# Patient Record
Sex: Female | Born: 1945 | Race: White | Hispanic: No | Marital: Married | Smoking: Former smoker
Health system: Southern US, Community
[De-identification: ages and names within clinical notes are randomized; demographics above are authoritative.]

## PROBLEM LIST (undated history)

## (undated) DIAGNOSIS — E785 Hyperlipidemia, unspecified: Secondary | ICD-10-CM

## (undated) DIAGNOSIS — I519 Heart disease, unspecified: Secondary | ICD-10-CM

## (undated) DIAGNOSIS — I10 Essential (primary) hypertension: Secondary | ICD-10-CM

## (undated) DIAGNOSIS — I2510 Atherosclerotic heart disease of native coronary artery without angina pectoris: Secondary | ICD-10-CM

## (undated) DIAGNOSIS — E559 Vitamin D deficiency, unspecified: Secondary | ICD-10-CM

## (undated) DIAGNOSIS — K5792 Diverticulitis of intestine, part unspecified, without perforation or abscess without bleeding: Secondary | ICD-10-CM

## (undated) DIAGNOSIS — R42 Dizziness and giddiness: Secondary | ICD-10-CM

## (undated) DIAGNOSIS — I219 Acute myocardial infarction, unspecified: Secondary | ICD-10-CM

## (undated) DIAGNOSIS — M1990 Unspecified osteoarthritis, unspecified site: Secondary | ICD-10-CM

## (undated) DIAGNOSIS — M797 Fibromyalgia: Secondary | ICD-10-CM

## (undated) DIAGNOSIS — Z972 Presence of dental prosthetic device (complete) (partial): Secondary | ICD-10-CM

## (undated) DIAGNOSIS — R011 Cardiac murmur, unspecified: Secondary | ICD-10-CM

## (undated) HISTORY — PX: VAGINAL HYSTERECTOMY: SUR661

## (undated) HISTORY — PX: EYE SURGERY: SHX253

## (undated) HISTORY — DX: Unspecified osteoarthritis, unspecified site: M19.90

## (undated) HISTORY — DX: Heart disease, unspecified: I51.9

## (undated) HISTORY — DX: Hyperlipidemia, unspecified: E78.5

## (undated) HISTORY — DX: Dizziness and giddiness: R42

## (undated) HISTORY — DX: Presence of dental prosthetic device (complete) (partial): Z97.2

---
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
## 2000-02-15 ENCOUNTER — Encounter: Admission: RE | Admit: 2000-02-15 | Discharge: 2000-02-15 | Payer: Self-pay | Admitting: Family Medicine

## 2000-02-15 ENCOUNTER — Encounter: Payer: Self-pay | Admitting: Family Medicine

## 2001-03-20 ENCOUNTER — Encounter: Payer: Self-pay | Admitting: Family Medicine

## 2001-03-20 ENCOUNTER — Encounter: Admission: RE | Admit: 2001-03-20 | Discharge: 2001-03-20 | Payer: Self-pay | Admitting: Family Medicine

## 2001-03-27 ENCOUNTER — Encounter: Payer: Self-pay | Admitting: Family Medicine

## 2001-03-27 ENCOUNTER — Encounter: Admission: RE | Admit: 2001-03-27 | Discharge: 2001-03-27 | Payer: Self-pay | Admitting: Family Medicine

## 2002-05-09 ENCOUNTER — Encounter: Payer: Self-pay | Admitting: Family Medicine

## 2002-05-09 ENCOUNTER — Encounter: Admission: RE | Admit: 2002-05-09 | Discharge: 2002-05-09 | Payer: Self-pay | Admitting: Family Medicine

## 2005-10-13 ENCOUNTER — Encounter: Admission: RE | Admit: 2005-10-13 | Discharge: 2005-10-13 | Payer: Self-pay | Admitting: Family Medicine

## 2006-07-27 ENCOUNTER — Encounter (HOSPITAL_COMMUNITY): Admission: RE | Admit: 2006-07-27 | Discharge: 2006-09-06 | Payer: Self-pay | Admitting: Cardiology

## 2006-12-09 ENCOUNTER — Encounter: Admission: RE | Admit: 2006-12-09 | Discharge: 2006-12-09 | Payer: Self-pay | Admitting: Rheumatology

## 2006-12-14 ENCOUNTER — Ambulatory Visit: Payer: Self-pay | Admitting: Oncology

## 2006-12-25 DIAGNOSIS — I219 Acute myocardial infarction, unspecified: Secondary | ICD-10-CM

## 2006-12-25 HISTORY — DX: Acute myocardial infarction, unspecified: I21.9

## 2006-12-27 ENCOUNTER — Encounter: Admission: RE | Admit: 2006-12-27 | Discharge: 2006-12-27 | Payer: Self-pay | Admitting: Family Medicine

## 2007-01-03 LAB — MORPHOLOGY - CHCC SATELLITE
Platelet Morphology: NORMAL
RBC Comments: NORMAL

## 2007-01-03 LAB — CBC WITH DIFFERENTIAL (CANCER CENTER ONLY)
BASO%: 0.5 % (ref 0.0–2.0)
EOS%: 2.2 % (ref 0.0–7.0)
HCT: 35.8 % (ref 34.8–46.6)
LYMPH%: 67.2 % — ABNORMAL HIGH (ref 14.0–48.0)
MCHC: 34.7 g/dL (ref 32.0–36.0)
MCV: 94 fL (ref 81–101)
MONO%: 7.9 % (ref 0.0–13.0)
NEUT%: 22.2 % — ABNORMAL LOW (ref 39.6–80.0)
Platelets: 168 10*3/uL (ref 145–400)
RDW: 13.5 % (ref 10.5–14.6)
WBC: 1.3 10*3/uL — ABNORMAL LOW (ref 3.9–10.0)

## 2007-01-04 ENCOUNTER — Ambulatory Visit (HOSPITAL_COMMUNITY): Admission: RE | Admit: 2007-01-04 | Discharge: 2007-01-04 | Payer: Self-pay | Admitting: Oncology

## 2007-01-07 LAB — COMPREHENSIVE METABOLIC PANEL
ALT: 23 U/L (ref 0–35)
Alkaline Phosphatase: 41 U/L (ref 39–117)
Calcium: 9.4 mg/dL (ref 8.4–10.5)
Chloride: 106 mEq/L (ref 96–112)
Creatinine, Ser: 0.82 mg/dL (ref 0.40–1.20)
Glucose, Bld: 105 mg/dL — ABNORMAL HIGH (ref 70–99)
Sodium: 141 mEq/L (ref 135–145)
Total Bilirubin: 0.6 mg/dL (ref 0.3–1.2)
Total Protein: 6.1 g/dL (ref 6.0–8.3)

## 2007-01-07 LAB — LEUKOCYTE ALKALINE PHOS: Leukocyte Alkaline  Phos Stain: 102 (ref 33–149)

## 2007-01-16 ENCOUNTER — Ambulatory Visit (HOSPITAL_COMMUNITY): Admission: RE | Admit: 2007-01-16 | Discharge: 2007-01-16 | Payer: Self-pay | Admitting: Oncology

## 2007-01-16 ENCOUNTER — Encounter (INDEPENDENT_AMBULATORY_CARE_PROVIDER_SITE_OTHER): Payer: Self-pay | Admitting: Interventional Radiology

## 2007-01-31 ENCOUNTER — Ambulatory Visit: Payer: Self-pay | Admitting: Oncology

## 2007-02-01 LAB — MORPHOLOGY - CHCC SATELLITE
PLT EST ~~LOC~~: ADEQUATE
Platelet Morphology: NORMAL

## 2007-02-01 LAB — MANUAL DIFFERENTIAL (CHCC SATELLITE)
Eos: 2 % (ref 0–7)
Metamyelocytes: 1 % — ABNORMAL HIGH (ref 0–0)
Platelet Morphology: NORMAL
SEG: 25 % — ABNORMAL LOW (ref 40–75)
Variant Lymph: 3 % — ABNORMAL HIGH (ref 0–0)

## 2007-02-01 LAB — CBC WITH DIFFERENTIAL (CANCER CENTER ONLY)
HGB: 11.3 g/dL — ABNORMAL LOW (ref 11.6–15.9)
MCH: 33.2 pg (ref 26.0–34.0)
MCHC: 34.8 g/dL (ref 32.0–36.0)
MCV: 95 fL (ref 81–101)
RBC: 3.41 10*6/uL — ABNORMAL LOW (ref 3.70–5.32)

## 2007-03-05 LAB — CBC WITH DIFFERENTIAL (CANCER CENTER ONLY)
BASO%: 0.7 % (ref 0.0–2.0)
EOS%: 4.4 % (ref 0.0–7.0)
LYMPH#: 1.2 10*3/uL (ref 0.9–3.3)
MCHC: 34.7 g/dL (ref 32.0–36.0)
MONO#: 0.2 10*3/uL (ref 0.1–0.9)
NEUT#: 1 10*3/uL — ABNORMAL LOW (ref 1.5–6.5)
NEUT%: 39.5 % — ABNORMAL LOW (ref 39.6–80.0)
Platelets: 183 10*3/uL (ref 145–400)
RDW: 13 % (ref 10.5–14.6)
WBC: 2.4 10*3/uL — ABNORMAL LOW (ref 3.9–10.0)

## 2007-05-27 ENCOUNTER — Ambulatory Visit: Payer: Self-pay | Admitting: Oncology

## 2007-05-28 LAB — COMPREHENSIVE METABOLIC PANEL
BUN: 15 mg/dL (ref 6–23)
CO2: 32 mEq/L (ref 19–32)
Calcium: 9.4 mg/dL (ref 8.4–10.5)
Chloride: 103 mEq/L (ref 96–112)
Creatinine, Ser: 0.75 mg/dL (ref 0.40–1.20)
Glucose, Bld: 95 mg/dL (ref 70–99)
Total Bilirubin: 0.4 mg/dL (ref 0.3–1.2)

## 2007-05-28 LAB — CBC WITH DIFFERENTIAL (CANCER CENTER ONLY)
BASO#: 0 10*3/uL (ref 0.0–0.2)
Eosinophils Absolute: 0.1 10*3/uL (ref 0.0–0.5)
HCT: 37.7 % (ref 34.8–46.6)
HGB: 13.1 g/dL (ref 11.6–15.9)
LYMPH#: 3 10*3/uL (ref 0.9–3.3)
MCH: 31.9 pg (ref 26.0–34.0)
NEUT#: 2.7 10*3/uL (ref 1.5–6.5)
RBC: 4.11 10*6/uL (ref 3.70–5.32)

## 2007-05-28 LAB — LACTATE DEHYDROGENASE: LDH: 139 U/L (ref 94–250)

## 2007-08-19 ENCOUNTER — Ambulatory Visit: Payer: Self-pay | Admitting: Oncology

## 2007-08-22 LAB — CBC WITH DIFFERENTIAL (CANCER CENTER ONLY)
BASO#: 0 10*3/uL (ref 0.0–0.2)
BASO%: 0.7 % (ref 0.0–2.0)
EOS%: 2.4 % (ref 0.0–7.0)
HGB: 13.2 g/dL (ref 11.6–15.9)
LYMPH#: 2.8 10*3/uL (ref 0.9–3.3)
MCHC: 34.6 g/dL (ref 32.0–36.0)
MONO#: 0.3 10*3/uL (ref 0.1–0.9)
NEUT#: 2.5 10*3/uL (ref 1.5–6.5)
WBC: 5.7 10*3/uL (ref 3.9–10.0)

## 2007-11-25 ENCOUNTER — Ambulatory Visit: Payer: Self-pay | Admitting: Oncology

## 2008-01-30 ENCOUNTER — Ambulatory Visit: Payer: Self-pay | Admitting: Oncology

## 2008-02-21 LAB — CBC WITH DIFFERENTIAL (CANCER CENTER ONLY)
BASO#: 0 10*3/uL (ref 0.0–0.2)
EOS%: 3.3 % (ref 0.0–7.0)
Eosinophils Absolute: 0.1 10*3/uL (ref 0.0–0.5)
HCT: 37.1 % (ref 34.8–46.6)
HGB: 12.7 g/dL (ref 11.6–15.9)
LYMPH#: 2.4 10*3/uL (ref 0.9–3.3)
MCH: 32.2 pg (ref 26.0–34.0)
MCHC: 34.3 g/dL (ref 32.0–36.0)
MONO%: 4.1 % (ref 0.0–13.0)
NEUT#: 1.4 10*3/uL — ABNORMAL LOW (ref 1.5–6.5)
NEUT%: 34 % — ABNORMAL LOW (ref 39.6–80.0)
RBC: 3.95 10*6/uL (ref 3.70–5.32)

## 2008-02-21 LAB — COMPREHENSIVE METABOLIC PANEL
BUN: 14 mg/dL (ref 6–23)
CO2: 32 mEq/L (ref 19–32)
Calcium: 9.6 mg/dL (ref 8.4–10.5)
Chloride: 104 mEq/L (ref 96–112)
Creatinine, Ser: 0.84 mg/dL (ref 0.40–1.20)

## 2008-03-06 ENCOUNTER — Encounter: Admission: RE | Admit: 2008-03-06 | Discharge: 2008-03-06 | Payer: Self-pay | Admitting: Family Medicine

## 2008-08-19 ENCOUNTER — Ambulatory Visit: Payer: Self-pay | Admitting: Oncology

## 2008-08-20 LAB — CBC WITH DIFFERENTIAL (CANCER CENTER ONLY)
BASO#: 0 10*3/uL (ref 0.0–0.2)
HCT: 35.5 % (ref 34.8–46.6)
HGB: 12.5 g/dL (ref 11.6–15.9)
LYMPH#: 2.4 10*3/uL (ref 0.9–3.3)
MONO#: 0.3 10*3/uL (ref 0.1–0.9)
NEUT#: 1.8 10*3/uL (ref 1.5–6.5)
NEUT%: 38.5 % — ABNORMAL LOW (ref 39.6–80.0)
RBC: 3.8 10*6/uL (ref 3.70–5.32)
WBC: 4.7 10*3/uL (ref 3.9–10.0)

## 2008-10-26 ENCOUNTER — Ambulatory Visit (HOSPITAL_COMMUNITY): Admission: RE | Admit: 2008-10-26 | Discharge: 2008-10-26 | Payer: Self-pay | Admitting: *Deleted

## 2008-10-30 ENCOUNTER — Ambulatory Visit (HOSPITAL_COMMUNITY): Admission: RE | Admit: 2008-10-30 | Discharge: 2008-10-30 | Payer: Self-pay | Admitting: *Deleted

## 2008-11-17 ENCOUNTER — Encounter: Admission: RE | Admit: 2008-11-17 | Discharge: 2008-11-17 | Payer: Self-pay | Admitting: *Deleted

## 2009-03-18 ENCOUNTER — Encounter: Admission: RE | Admit: 2009-03-18 | Discharge: 2009-06-16 | Payer: Self-pay | Admitting: *Deleted

## 2009-04-05 ENCOUNTER — Ambulatory Visit (HOSPITAL_COMMUNITY): Admission: RE | Admit: 2009-04-05 | Discharge: 2009-04-06 | Payer: Self-pay | Admitting: *Deleted

## 2009-04-05 HISTORY — PX: LAPAROSCOPIC GASTRIC BANDING: SHX1100

## 2009-08-25 HISTORY — PX: CORONARY STENT PLACEMENT: SHX1402

## 2009-09-12 ENCOUNTER — Encounter: Payer: Self-pay | Admitting: Emergency Medicine

## 2009-09-13 ENCOUNTER — Ambulatory Visit (HOSPITAL_COMMUNITY): Admission: AD | Admit: 2009-09-13 | Discharge: 2009-09-13 | Payer: Self-pay | Admitting: Cardiovascular Disease

## 2009-09-13 ENCOUNTER — Observation Stay (HOSPITAL_COMMUNITY): Admission: EM | Admit: 2009-09-13 | Discharge: 2009-09-14 | Payer: Self-pay | Admitting: Cardiovascular Disease

## 2009-09-30 ENCOUNTER — Encounter (HOSPITAL_COMMUNITY): Admission: RE | Admit: 2009-09-30 | Discharge: 2009-12-21 | Payer: Self-pay | Admitting: Cardiology

## 2010-02-04 ENCOUNTER — Encounter: Admission: RE | Admit: 2010-02-04 | Discharge: 2010-02-04 | Payer: Self-pay | Admitting: Family Medicine

## 2010-12-30 ENCOUNTER — Encounter (HOSPITAL_COMMUNITY)
Admission: RE | Admit: 2010-12-30 | Discharge: 2011-01-24 | Payer: Self-pay | Source: Home / Self Care | Attending: Rheumatology | Admitting: Rheumatology

## 2011-01-15 ENCOUNTER — Encounter: Payer: Self-pay | Admitting: Rheumatology

## 2011-01-15 ENCOUNTER — Encounter: Payer: Self-pay | Admitting: Family Medicine

## 2011-01-27 ENCOUNTER — Encounter (HOSPITAL_COMMUNITY): Payer: BC Managed Care – PPO | Attending: Interventional Radiology

## 2011-01-27 DIAGNOSIS — M069 Rheumatoid arthritis, unspecified: Secondary | ICD-10-CM | POA: Insufficient documentation

## 2011-02-24 ENCOUNTER — Encounter (HOSPITAL_COMMUNITY): Payer: BC Managed Care – PPO | Attending: Interventional Radiology

## 2011-02-24 DIAGNOSIS — M069 Rheumatoid arthritis, unspecified: Secondary | ICD-10-CM | POA: Insufficient documentation

## 2011-03-24 ENCOUNTER — Encounter (HOSPITAL_COMMUNITY)
Admission: RE | Admit: 2011-03-24 | Discharge: 2011-03-24 | Payer: BC Managed Care – PPO | Source: Ambulatory Visit | Attending: Rheumatology | Admitting: Rheumatology

## 2011-03-31 LAB — BASIC METABOLIC PANEL
BUN: 11 mg/dL (ref 6–23)
CO2: 27 mEq/L (ref 19–32)
CO2: 30 mEq/L (ref 19–32)
Calcium: 8.8 mg/dL (ref 8.4–10.5)
Chloride: 109 mEq/L (ref 96–112)
GFR calc Af Amer: >60 mL/min (ref >60)
GFR calc Af Amer: >60 mL/min (ref >60)
Glucose, Bld: 107 mg/dL — ABNORMAL HIGH (ref 70–99)
Potassium: 3.3 mEq/L — ABNORMAL LOW (ref 3.5–5.1)

## 2011-03-31 LAB — CBC
HCT: 32.8 % — ABNORMAL LOW (ref 36.0–46.0)
Hemoglobin: 11.2 g/dL — ABNORMAL LOW (ref 12.0–15.0)
MCHC: 34 g/dL (ref 30.0–36.0)
MCV: 102.1 fL — ABNORMAL HIGH (ref 78.0–100.0)
MCV: 103.4 fL — ABNORMAL HIGH (ref 78.0–100.0)
Platelets: 146 10*3/uL — ABNORMAL LOW (ref 150–400)
RBC: 3.18 MIL/uL — ABNORMAL LOW (ref 3.87–5.11)
RDW: 13.4 % (ref 11.5–15.5)
RDW: 14.2 % (ref 11.5–15.5)
WBC: 5.3 10*3/uL (ref 4.0–10.5)

## 2011-03-31 LAB — CARDIAC PANEL(CRET KIN+CKTOT+MB+TROPI)
CK, MB: 1.9 ng/mL (ref 0.3–4.0)
Troponin I: 0.03 ng/mL (ref 0.00–0.06)
Troponin I: 0.04 ng/mL (ref 0.00–0.06)

## 2011-03-31 LAB — DIFFERENTIAL
Basophils Absolute: 0.1 10*3/uL (ref 0.0–0.1)
Basophils Relative: 1 % (ref 0–1)
Eosinophils Absolute: 0.2 10*3/uL (ref 0.0–0.7)
Eosinophils Relative: 4 % (ref 0–5)
Lymphs Abs: 2.1 10*3/uL (ref 0.7–4.0)
Monocytes Absolute: 0.3 10*3/uL (ref 0.1–1.0)
Monocytes Relative: 5 % (ref 3–12)

## 2011-03-31 LAB — URINALYSIS, ROUTINE W REFLEX MICROSCOPIC
Glucose, UA: NEGATIVE mg/dL
Hgb urine dipstick: NEGATIVE
Protein, ur: NEGATIVE mg/dL
Urobilinogen, UA: 0.2 mg/dL (ref 0.0–1.0)
pH: 6 (ref 5.0–8.0)

## 2011-03-31 LAB — CK TOTAL AND CKMB (NOT AT ARMC)
CK, MB: 4.5 ng/mL — ABNORMAL HIGH (ref 0.3–4.0)
Relative Index: INVALID (ref 0.0–2.5)
Total CK: 59 U/L (ref 7–177)
Total CK: 61 U/L (ref 7–177)

## 2011-03-31 LAB — POCT CARDIAC MARKERS
CKMB, poc: 1.4 ng/mL (ref 1.0–8.0)
Troponin i, poc: <0.05 ng/mL (ref 0.00–0.09)

## 2011-03-31 LAB — TSH: TSH: 6.209 u[IU]/mL — ABNORMAL HIGH (ref 0.350–4.500)

## 2011-03-31 LAB — TROPONIN I: Troponin I: 0.03 ng/mL (ref 0.00–0.06)

## 2011-03-31 LAB — LIPID PANEL: Triglycerides: 189 mg/dL — ABNORMAL HIGH (ref <150)

## 2011-04-05 LAB — CBC
HCT: 37.7 % (ref 36.0–46.0)
Hemoglobin: 12.4 g/dL (ref 12.0–15.0)
Hemoglobin: 12.8 g/dL (ref 12.0–15.0)
Hemoglobin: 13.1 g/dL (ref 12.0–15.0)
Platelets: 153 10*3/uL (ref 150–400)
Platelets: 191 10*3/uL (ref 150–400)
RBC: 3.67 MIL/uL — ABNORMAL LOW (ref 3.87–5.11)
RBC: 3.78 MIL/uL — ABNORMAL LOW (ref 3.87–5.11)
RDW: 14 % (ref 11.5–15.5)
WBC: 4.4 10*3/uL (ref 4.0–10.5)

## 2011-04-05 LAB — COMPREHENSIVE METABOLIC PANEL
Albumin: 4.3 g/dL (ref 3.5–5.2)
Alkaline Phosphatase: 34 U/L — ABNORMAL LOW (ref 39–117)
BUN: 27 mg/dL — ABNORMAL HIGH (ref 6–23)
CO2: 31 mEq/L (ref 19–32)
Chloride: 102 mEq/L (ref 96–112)
Glucose, Bld: 117 mg/dL — ABNORMAL HIGH (ref 70–99)
Potassium: 3.6 mEq/L (ref 3.5–5.1)
Total Bilirubin: 0.7 mg/dL (ref 0.3–1.2)

## 2011-04-05 LAB — DIFFERENTIAL
Basophils Absolute: 0 10*3/uL (ref 0.0–0.1)
Basophils Relative: 1 % (ref 0–1)
Lymphocytes Relative: 36 % (ref 12–46)
Lymphs Abs: 2.3 10*3/uL (ref 0.7–4.0)
Monocytes Absolute: 0.3 10*3/uL (ref 0.1–1.0)
Monocytes Absolute: 0.3 10*3/uL (ref 0.1–1.0)
Monocytes Relative: 5 % (ref 3–12)
Neutro Abs: 2.4 10*3/uL (ref 1.7–7.7)
Neutro Abs: 3.7 10*3/uL (ref 1.7–7.7)

## 2011-04-19 ENCOUNTER — Encounter (HOSPITAL_COMMUNITY): Payer: BC Managed Care – PPO | Attending: Interventional Radiology

## 2011-04-19 DIAGNOSIS — M069 Rheumatoid arthritis, unspecified: Secondary | ICD-10-CM | POA: Insufficient documentation

## 2011-05-09 NOTE — Op Note (Signed)
NAMEARDIE, DRAGOO                  ACCOUNT NO.:  [id number]   MEDICAL RECORD NO.:  [id number]          PATIENT TYPE:  OIB   LOCATION:  1539                         FACILITY:  [facility]   PHYSICIAN:  Thornton Park. Daphine Deutscher, MD  DATE OF BIRTH:  05-May-1946   DATE OF PROCEDURE:  04/05/2009  DATE OF DISCHARGE:                               OPERATIVE REPORT   PREOPERATIVE DIAGNOSIS:  Vanessa Collins has rheumatoid arthritis and Felty  syndrome and morbid obesity.   PROCEDURE:  Laparoscopic adjustable gastric banding (Allergan APS).   ASSISTANT:  Karie Soda, MD.   ANESTHESIA:  General endotracheal.   DESCRIPTION OF PROCEDURE:  Ms. Venne is a 65 year old lady taken to room  one on April 05, 2009, and given general anesthesia.  The abdomen was  prepped with a Techni-Care equivalent and draped sterilely.  After  appropriate time-out, the patient's abdomen was accessed via the left  upper quadrant using an OptiView 11/12 port without difficulty and the  abdomen was insufflated.  Standard trocar placements were used, and a 5  mm was placed in the upper abdomen for the [facility] retractor  placement.  This exposed the GE junction.   We then exposed the left side, and I went ahead made a little incision  on the crus and did a preliminary dissection there.  We opened up in for  the pars flaccida approach and identified the fat stripe along the right  crus.  I opened that up and easily passed the band passer around.  The  APS band was introduced into the abdomen, and the tip with a silk on the  tip was threaded through the band passer and then pulled around without  difficulty, engaging the buckle which was over the first hump before  locking.  The calibration tubing was passed initially by the nurse  anesthetist, but I went ahead and got Dr. Jean Rosenthal, and he was able to  pass, we identified and then we snapped the band in place over that.  It  was then plicated anteriorly with three sutures, getting bites  of the EG  junction, small pouch and holding the plication stitches in place with  tie knots.  The band appeared to be in good position, and the tubing was  brought to the lower port, affixed to the port device.  This was then  secured to the fascia with four sutures of 2-0 Prolene.   The wounds were irrigated and closed in layers with 4-0 Vicryl and with  Benzoin and Steri-Strips.  The patient tolerated the procedure well and  was taken to the recovery room in satisfactory condition.  She will be  kept overnight for observation.      Thornton Park Daphine Deutscher, MD  Electronically Signed     MBM/MEDQ  D:  04/05/2009  T:  04/05/2009  Job:  [id number]   cc:   L. Lupe Carney, M.D.  Fax: [phone]   Eduardo Osier. Sharyn Lull, M.D.  Fax: [phone]   Sanjeev K. Corliss Skains, M.D.  Fax: [phone]   Drue Second, MD  Fax: [phone]

## 2011-05-19 ENCOUNTER — Encounter (HOSPITAL_COMMUNITY): Payer: BC Managed Care – PPO | Attending: Interventional Radiology

## 2011-05-19 DIAGNOSIS — M069 Rheumatoid arthritis, unspecified: Secondary | ICD-10-CM | POA: Insufficient documentation

## 2012-01-11 ENCOUNTER — Other Ambulatory Visit: Payer: Self-pay | Admitting: Family Medicine

## 2012-01-11 DIAGNOSIS — Z1231 Encounter for screening mammogram for malignant neoplasm of breast: Secondary | ICD-10-CM

## 2012-02-05 ENCOUNTER — Other Ambulatory Visit: Payer: Self-pay | Admitting: Cardiology

## 2012-02-12 ENCOUNTER — Ambulatory Visit: Payer: BC Managed Care – PPO

## 2012-02-19 ENCOUNTER — Ambulatory Visit: Payer: BC Managed Care – PPO

## 2012-02-21 ENCOUNTER — Ambulatory Visit: Payer: BC Managed Care – PPO

## 2012-02-28 ENCOUNTER — Ambulatory Visit: Payer: BC Managed Care – PPO

## 2012-02-29 ENCOUNTER — Ambulatory Visit: Payer: BC Managed Care – PPO

## 2012-03-07 ENCOUNTER — Encounter (INDEPENDENT_AMBULATORY_CARE_PROVIDER_SITE_OTHER): Payer: Self-pay | Admitting: Surgery

## 2012-03-28 ENCOUNTER — Encounter (INDEPENDENT_AMBULATORY_CARE_PROVIDER_SITE_OTHER): Payer: BC Managed Care – PPO

## 2012-05-02 ENCOUNTER — Encounter (INDEPENDENT_AMBULATORY_CARE_PROVIDER_SITE_OTHER): Payer: BC Managed Care – PPO

## 2012-05-28 ENCOUNTER — Encounter (INDEPENDENT_AMBULATORY_CARE_PROVIDER_SITE_OTHER): Payer: Self-pay | Admitting: Surgery

## 2012-05-28 DIAGNOSIS — Z972 Presence of dental prosthetic device (complete) (partial): Secondary | ICD-10-CM | POA: Insufficient documentation

## 2012-05-30 ENCOUNTER — Encounter (INDEPENDENT_AMBULATORY_CARE_PROVIDER_SITE_OTHER): Payer: BC Managed Care – PPO

## 2012-06-03 ENCOUNTER — Other Ambulatory Visit: Payer: Self-pay | Admitting: Cardiology

## 2012-06-05 ENCOUNTER — Encounter (INDEPENDENT_AMBULATORY_CARE_PROVIDER_SITE_OTHER): Payer: Self-pay | Admitting: Physician Assistant

## 2012-08-01 ENCOUNTER — Ambulatory Visit (INDEPENDENT_AMBULATORY_CARE_PROVIDER_SITE_OTHER): Payer: BC Managed Care – PPO | Admitting: Physician Assistant

## 2012-08-01 ENCOUNTER — Encounter (INDEPENDENT_AMBULATORY_CARE_PROVIDER_SITE_OTHER): Payer: Self-pay

## 2012-08-01 VITALS — BP 132/70 | HR 64 | Temp 97.1°F | Resp 16 | Ht 65.0 in | Wt 175.8 lb

## 2012-08-01 DIAGNOSIS — Z4651 Encounter for fitting and adjustment of gastric lap band: Secondary | ICD-10-CM

## 2012-08-01 NOTE — Patient Instructions (Signed)
Take clear liquids tonight. Thin protein shakes are ok to start tomorrow morning. Slowly advance your diet thereafter. Call us if you have persistent vomiting or regurgitation, night cough or reflux symptoms. Return as scheduled or sooner if you notice no changes in hunger/portion sizes.  

## 2012-08-01 NOTE — Progress Notes (Signed)
  HISTORY: Vanessa Collins is a 66 y.o.female who received an AP-Standard lap-band in April 2010 by Dr. Daphine Deutscher. She comes in having been last seen 15 months ago and has lost 2 lbs. She says she had lost 10 lbs but began gaining weight again due to increasing hunger and portion sizes. She denies persistent reflux or regurgitation.  VITAL SIGNS: Filed Vitals:   08/01/12 1014  BP: 132/70  Pulse: 64  Temp: 97.1 F (36.2 C)  Resp: 16    PHYSICAL EXAM: Physical exam reveals a very well-appearing 66 y.o.female in no apparent distress Neurologic: Awake, alert, oriented Psych: Bright affect, conversant Respiratory: Breathing even and unlabored. No stridor or wheezing Abdomen: Soft, nontender, nondistended to palpation. Incisions well-healed. No incisional hernias. Port easily palpated. Extremities: Atraumatic, good range of motion.  ASSESMENT: 66 y.o.  female  s/p AP-Standard lap-band.   PLAN: The patient's port was accessed with a 20G Huber needle without difficulty. Clear fluid was aspirated and 0.5 mL saline was added to the port. The patient was able to swallow water without difficulty following the procedure and was instructed to take clear liquids for the next 24-48 hours and advance slowly as tolerated.

## 2012-10-30 ENCOUNTER — Ambulatory Visit: Payer: BC Managed Care – PPO

## 2012-11-07 ENCOUNTER — Encounter (INDEPENDENT_AMBULATORY_CARE_PROVIDER_SITE_OTHER): Payer: BC Managed Care – PPO

## 2012-11-28 ENCOUNTER — Encounter (INDEPENDENT_AMBULATORY_CARE_PROVIDER_SITE_OTHER): Payer: Self-pay

## 2012-12-12 ENCOUNTER — Ambulatory Visit: Payer: BC Managed Care – PPO

## 2013-01-09 ENCOUNTER — Encounter (INDEPENDENT_AMBULATORY_CARE_PROVIDER_SITE_OTHER): Payer: Self-pay

## 2013-02-13 ENCOUNTER — Ambulatory Visit (INDEPENDENT_AMBULATORY_CARE_PROVIDER_SITE_OTHER): Payer: BC Managed Care – PPO | Admitting: Physician Assistant

## 2013-02-13 ENCOUNTER — Encounter (INDEPENDENT_AMBULATORY_CARE_PROVIDER_SITE_OTHER): Payer: Self-pay

## 2013-02-13 VITALS — BP 134/82 | HR 68 | Temp 97.6°F | Ht 64.0 in | Wt 167.0 lb

## 2013-02-13 DIAGNOSIS — Z4651 Encounter for fitting and adjustment of gastric lap band: Secondary | ICD-10-CM

## 2013-02-13 NOTE — Progress Notes (Signed)
  HISTORY: Vanessa Collins is a 67 y.o.female who received an AP-Standard lap-band in April 2010 by Dr. Daphine Deutscher. She comes in with increasing hunger and larger than desired portion sizes. She denies regurgitation or reflux symptoms. She would like a fill today to get her eating under better control. She is targeting a weight goal of 135 lbs to help her RA.  VITAL SIGNS: Filed Vitals:   02/13/13 1256  BP: 134/82  Pulse: 68  Temp: 97.6 F (36.4 C)    PHYSICAL EXAM: Physical exam reveals a very well-appearing 67 y.o.female in no apparent distress Neurologic: Awake, alert, oriented Psych: Bright affect, conversant Respiratory: Breathing even and unlabored. No stridor or wheezing Abdomen: Soft, nontender, nondistended to palpation. Incisions well-healed. No incisional hernias. Port easily palpated. Extremities: Atraumatic, good range of motion.  ASSESMENT: 67 y.o.  female  s/p AP-Standard lap-band.   PLAN: The patient's port was accessed with a 20G Huber needle without difficulty. Clear fluid was aspirated and 0.25 mL saline was added to the port. The patient was able to swallow water without difficulty following the procedure and was instructed to take clear liquids for the next 24-48 hours and advance slowly as tolerated.

## 2013-02-13 NOTE — Patient Instructions (Signed)
Take clear liquids tonight. Thin protein shakes are ok to start tomorrow morning. Slowly advance your diet thereafter. Call us if you have persistent vomiting or regurgitation, night cough or reflux symptoms. Return as scheduled or sooner if you notice no changes in hunger/portion sizes.  

## 2013-02-25 ENCOUNTER — Ambulatory Visit (INDEPENDENT_AMBULATORY_CARE_PROVIDER_SITE_OTHER): Payer: BC Managed Care – PPO | Admitting: Surgery

## 2013-02-25 ENCOUNTER — Encounter (INDEPENDENT_AMBULATORY_CARE_PROVIDER_SITE_OTHER): Payer: Self-pay | Admitting: Surgery

## 2013-02-25 VITALS — BP 122/70 | HR 72 | Temp 97.1°F | Resp 16 | Ht 64.0 in | Wt 159.2 lb

## 2013-02-25 DIAGNOSIS — Z9884 Bariatric surgery status: Secondary | ICD-10-CM

## 2013-02-25 NOTE — Progress Notes (Signed)
Lapband Fill Encounter Problem List:   Patient Active Problem List  Diagnosis  . Wears dentures    [name] Body mass index is 27.31 kg/(m^2). Weight loss since surgery  47  Having regurgitation?:  yes  Feel that they need a fill?  unfill  Nocturnal reflux?  no  Amount of fill  -0.15   Port site: Accessed easily  Instructions given and weight loss goals discussed.  Since her phenol on February 20 she is noticing she is getting tighter and had difficulty swallowing her coffee in the morning and has been notably eat. I went ahead and accessed her port easily and withdrew 0.15 cc of fluid and a little bit of air on top. I used a tuberculin syringe and let the pressure pushed back. She'll followup in and in 4 months  Matt B. Daphine Deutscher, MD, FACS

## 2013-02-25 NOTE — Patient Instructions (Addendum)

## 2013-06-19 ENCOUNTER — Encounter (INDEPENDENT_AMBULATORY_CARE_PROVIDER_SITE_OTHER): Payer: BC Managed Care – PPO

## 2013-09-26 ENCOUNTER — Other Ambulatory Visit: Payer: Self-pay

## 2013-09-26 DIAGNOSIS — Z1231 Encounter for screening mammogram for malignant neoplasm of breast: Secondary | ICD-10-CM

## 2013-10-27 ENCOUNTER — Ambulatory Visit
Admission: RE | Admit: 2013-10-27 | Discharge: 2013-10-27 | Disposition: A | Discharge status: Home / Self Care | Payer: BC Managed Care – PPO | Source: Ambulatory Visit

## 2013-10-27 DIAGNOSIS — Z1231 Encounter for screening mammogram for malignant neoplasm of breast: Secondary | ICD-10-CM

## 2014-02-26 ENCOUNTER — Encounter (INDEPENDENT_AMBULATORY_CARE_PROVIDER_SITE_OTHER): Payer: BC Managed Care – PPO

## 2014-03-26 ENCOUNTER — Encounter (INDEPENDENT_AMBULATORY_CARE_PROVIDER_SITE_OTHER): Payer: BC Managed Care – PPO

## 2014-04-02 ENCOUNTER — Encounter (INDEPENDENT_AMBULATORY_CARE_PROVIDER_SITE_OTHER): Payer: BC Managed Care – PPO

## 2014-10-16 ENCOUNTER — Other Ambulatory Visit: Payer: Self-pay

## 2014-10-16 DIAGNOSIS — Z1231 Encounter for screening mammogram for malignant neoplasm of breast: Secondary | ICD-10-CM

## 2014-11-25 ENCOUNTER — Ambulatory Visit: Payer: BC Managed Care – PPO

## 2014-12-23 ENCOUNTER — Ambulatory Visit: Payer: BC Managed Care – PPO

## 2015-01-06 ENCOUNTER — Encounter (INDEPENDENT_AMBULATORY_CARE_PROVIDER_SITE_OTHER): Payer: Self-pay

## 2015-01-06 ENCOUNTER — Ambulatory Visit
Admission: RE | Admit: 2015-01-06 | Discharge: 2015-01-06 | Disposition: A | Discharge status: Home / Self Care | Payer: BLUE CROSS/BLUE SHIELD | Source: Ambulatory Visit

## 2015-01-06 DIAGNOSIS — Z1231 Encounter for screening mammogram for malignant neoplasm of breast: Secondary | ICD-10-CM

## 2015-03-02 ENCOUNTER — Other Ambulatory Visit: Payer: Self-pay | Admitting: Family Medicine

## 2015-03-02 ENCOUNTER — Ambulatory Visit
Admission: RE | Admit: 2015-03-02 | Discharge: 2015-03-02 | Disposition: A | Discharge status: Home / Self Care | Payer: BLUE CROSS/BLUE SHIELD | Source: Ambulatory Visit | Attending: Family Medicine | Admitting: Family Medicine

## 2015-03-02 DIAGNOSIS — R05 Cough: Secondary | ICD-10-CM

## 2015-03-02 DIAGNOSIS — R042 Hemoptysis: Secondary | ICD-10-CM

## 2015-03-02 DIAGNOSIS — R053 Chronic cough: Secondary | ICD-10-CM

## 2016-09-29 ENCOUNTER — Encounter (HOSPITAL_COMMUNITY): Payer: Self-pay

## 2016-11-07 ENCOUNTER — Telehealth: Payer: Self-pay | Admitting: Rheumatology

## 2016-11-07 MED ORDER — TOFACITINIB CITRATE 5 MG PO TABS: ORAL_TABLET | ORAL | 2 refills | Status: DC

## 2016-11-07 NOTE — Telephone Encounter (Signed)
I have been able to pull up labs on [facility] from 09/27/16 called patient to advise they are normal Ok to refill per Dr Corliss Skains

## 2016-11-07 NOTE — Telephone Encounter (Signed)
Patient states she had labs done in October and Harriette Ohara is getting denied stating she needs labwork done. Please advise.

## 2016-11-23 ENCOUNTER — Telehealth: Payer: Self-pay | Admitting: Rheumatology

## 2016-11-23 NOTE — Telephone Encounter (Signed)
Called patient on 11/14/16 and 11/23/16. No answer and voicemail was full both times.

## 2016-11-23 NOTE — Telephone Encounter (Signed)
-----   Message from Caffie Damme, RT sent at 11/13/2016  4:18 PM EST ----- Patient needs follow up appt, 6 months from previous

## 2016-12-04 ENCOUNTER — Other Ambulatory Visit: Payer: Self-pay | Admitting: Rheumatology

## 2016-12-04 ENCOUNTER — Telehealth: Payer: Self-pay | Admitting: Rheumatology

## 2016-12-04 NOTE — Telephone Encounter (Signed)
Last Visit: 07/27/16 Next Visit due Jan. 2018. Message sent to front to schedule.  Labs: 10/02/16 WNL  Okay to refill Prednisone?

## 2016-12-04 NOTE — Telephone Encounter (Signed)
-----   Message from Henriette Combs, LPN sent at 57/12/7791  9:54 AM EST ----- Regarding: Please schedule patient for a follow up appointment Please schedule patient for a follow up appointment. Patient due Jan. 2018. Thanks!!

## 2016-12-04 NOTE — Telephone Encounter (Signed)
Attempted to call patient, no answer and patient's mailbox is full.

## 2016-12-04 NOTE — Telephone Encounter (Signed)
Note: Is pt off of xeljanz? Is she hurting more as a result and needs to be on xeljanz. On august note, she wanted to be off of xeljanz.  Keep jan 2018 appt.

## 2016-12-05 NOTE — Telephone Encounter (Signed)
Attempted to contact patient and unable to leave message, mailbox is full.  

## 2016-12-12 NOTE — Telephone Encounter (Signed)
Attempted to contact patient and unable to leave message-mailbox is full.  

## 2016-12-23 ENCOUNTER — Other Ambulatory Visit: Payer: Self-pay | Admitting: Rheumatology

## 2017-01-08 ENCOUNTER — Other Ambulatory Visit: Payer: Self-pay | Admitting: Rheumatology

## 2017-01-08 NOTE — Telephone Encounter (Signed)
Last Visit: 07/27/16 Next Visit due January 2018 and message sent to front to schedule patient.  Labs: 10/02/16 WM: TB Gold: 01/2016 Neg Attempted to contact the patient to advise she is due for labs and unable to leave a message mailbox is full.

## 2017-01-12 ENCOUNTER — Telehealth: Payer: Self-pay | Admitting: Pharmacist

## 2017-01-12 NOTE — Telephone Encounter (Signed)
Received a prior authorization request for patient's Vanessa Collins.  Submitted PA through Cover My Med[employer] and PA was approved, [id number], Coverage Start Date:12/13/2016, Coverage End Date:01/12/2018.    Noted that Vanessa Collins refill is pending as patient is due for lab work.  Called patient to inform her of PA and discuss labs.  Patient did not answer and voicemail was full.

## 2017-01-15 NOTE — Telephone Encounter (Signed)
Second attempt to reach patient to advise her that Harriette Ohara PA was approved and to inform her she is due for labs.  Patient did not answer and voicemail is still full.

## 2017-01-16 NOTE — Telephone Encounter (Signed)
Third attempt to reach patient to advise her that Harriette Ohara PA was approved and to inform her she is due for labs.  Patient did not answer and voicemail is still full.  Noted patient had follow up visit scheduled on 01/25/17.

## 2017-01-24 ENCOUNTER — Other Ambulatory Visit: Payer: Self-pay | Admitting: Rheumatology

## 2017-01-24 NOTE — Telephone Encounter (Signed)
ok 

## 2017-01-24 NOTE — Telephone Encounter (Signed)
Last Visit: 07/27/16 Next Visit: 01/25/17 Labs: 10/02/16 WNL  Okay to refill Celebrex?

## 2017-01-25 ENCOUNTER — Encounter: Payer: Self-pay | Admitting: Rheumatology

## 2017-01-25 ENCOUNTER — Ambulatory Visit (INDEPENDENT_AMBULATORY_CARE_PROVIDER_SITE_OTHER): Payer: BLUE CROSS/BLUE SHIELD | Admitting: Rheumatology

## 2017-01-25 VITALS — BP 130/68 | HR 68 | Resp 16 | Ht 64.0 in | Wt 156.0 lb

## 2017-01-25 DIAGNOSIS — M79672 Pain in left foot: Secondary | ICD-10-CM

## 2017-01-25 DIAGNOSIS — Z9225 Personal history of immunosupression therapy: Secondary | ICD-10-CM | POA: Diagnosis not present

## 2017-01-25 DIAGNOSIS — D849 Immunodeficiency, unspecified: Secondary | ICD-10-CM | POA: Insufficient documentation

## 2017-01-25 DIAGNOSIS — M79671 Pain in right foot: Secondary | ICD-10-CM | POA: Diagnosis not present

## 2017-01-25 DIAGNOSIS — M19042 Primary osteoarthritis, left hand: Secondary | ICD-10-CM | POA: Insufficient documentation

## 2017-01-25 DIAGNOSIS — M5030 Other cervical disc degeneration, unspecified cervical region: Secondary | ICD-10-CM

## 2017-01-25 DIAGNOSIS — M19041 Primary osteoarthritis, right hand: Secondary | ICD-10-CM | POA: Diagnosis not present

## 2017-01-25 DIAGNOSIS — M47816 Spondylosis without myelopathy or radiculopathy, lumbar region: Secondary | ICD-10-CM | POA: Diagnosis not present

## 2017-01-25 DIAGNOSIS — M0579 Rheumatoid arthritis with rheumatoid factor of multiple sites without organ or systems involvement: Secondary | ICD-10-CM | POA: Diagnosis not present

## 2017-01-25 DIAGNOSIS — Z79899 Other long term (current) drug therapy: Secondary | ICD-10-CM | POA: Insufficient documentation

## 2017-01-25 DIAGNOSIS — M25571 Pain in right ankle and joints of right foot: Secondary | ICD-10-CM

## 2017-01-25 DIAGNOSIS — M25572 Pain in left ankle and joints of left foot: Secondary | ICD-10-CM

## 2017-01-25 DIAGNOSIS — G8929 Other chronic pain: Secondary | ICD-10-CM

## 2017-01-25 DIAGNOSIS — Z862 Personal history of diseases of the blood and blood-forming organs and certain disorders involving the immune mechanism: Secondary | ICD-10-CM | POA: Diagnosis not present

## 2017-01-25 DIAGNOSIS — Z8679 Personal history of other diseases of the circulatory system: Secondary | ICD-10-CM | POA: Insufficient documentation

## 2017-01-25 DIAGNOSIS — M797 Fibromyalgia: Secondary | ICD-10-CM | POA: Insufficient documentation

## 2017-01-25 DIAGNOSIS — M47812 Spondylosis without myelopathy or radiculopathy, cervical region: Secondary | ICD-10-CM | POA: Insufficient documentation

## 2017-01-25 DIAGNOSIS — D899 Disorder involving the immune mechanism, unspecified: Secondary | ICD-10-CM

## 2017-01-25 LAB — CBC WITH DIFFERENTIAL/PLATELET
Basophils Absolute: 0 cells/uL (ref 0–200)
Basophils Relative: 0 %
EOS ABS: 79 {cells}/uL (ref 15–500)
Eosinophils Relative: 1 %
HEMATOCRIT: 37.1 % (ref 35.0–45.0)
HEMOGLOBIN: 12.3 g/dL (ref 11.7–15.5)
LYMPHS ABS: 790 {cells}/uL — AB (ref 850–3900)
LYMPHS PCT: 10 %
MCH: 32.3 pg (ref 27.0–33.0)
MCHC: 33.2 g/dL (ref 32.0–36.0)
MCV: 97.4 fL (ref 80.0–100.0)
MONO ABS: 632 {cells}/uL (ref 200–950)
MPV: 11.6 fL (ref 7.5–12.5)
Monocytes Relative: 8 %
NEUTROS ABS: 6399 {cells}/uL (ref 1500–7800)
Neutrophils Relative %: 81 %
Platelets: 208 10*3/uL (ref 140–400)
RBC: 3.81 MIL/uL (ref 3.80–5.10)
RDW: 13.1 % (ref 11.0–15.0)
WBC: 7.9 10*3/uL (ref 3.8–10.8)

## 2017-01-25 MED ORDER — PREDNISONE 5 MG PO TABS: ORAL_TABLET | ORAL | 0 refills | Status: DC

## 2017-01-25 MED ORDER — DICLOFENAC SODIUM 1 % TD GEL: TRANSDERMAL | 3 refills | Status: DC

## 2017-01-25 NOTE — Progress Notes (Signed)
Office Visit Note  Patient: Vanessa Collins             Date of Birth: June 30, 1946           MRN: [id number]             PCP: Vanessa Carney, MD Referring: Vanessa Collins.Vanessa Saucer, MD Visit Date: 01/25/2017 Occupation: @GUAROCC @    Subjective:  Follow-up Rheumatoid arthritis, high risk prescription, fibromyalgia  History of Present Illness: Vanessa Collins is a 71 y.o. female  Last seen 07/27/2016  Currently patient has not taken's L Jones for about a week to a week and a half due to upper respiratory infection. As a result, she's been having a flare of RA pain to her hands. Patient is requesting some steroid medication for bridging therapy. She has improved significantly with her infection and she plans to restart results and soon but she is hurting quite a bit at this time and would like to have some relief.  Patient's fibromyalgia is rated about 8 on a scale of 0-10. She has good and bad days and currently that bad weather has caused her to flare with that as well.  She has ongoing OA hands that cause her some stiffness.  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 Ongoing back pain from DDD of the L-spine. Patient really likes how effective results and says and is very very pleased  Patient is due for repeat x-rays of bilateral hands, bilateral feet, bilateral ankle joint. Although we offered it at today's visit, patient has another engagement that she must attend to and is requesting 09/26/2016 to x-ray her at her next office visit. We agreeable.   Activities of Daily Living:  Patient reports morning stiffness for 45 minutes.   Patient Reports nocturnal pain.  Difficulty dressing/grooming: Reports Difficulty climbing stairs: Reports Difficulty getting out of chair: Reports Difficulty using hands for taps, buttons, cutlery, and/or writing: Reports   Review of Systems  Constitutional: Negative for fatigue.  HENT: Negative for mouth sores and mouth dryness.   Eyes: Negative for dryness.    Respiratory: Negative for shortness of breath.   Gastrointestinal: Negative for constipation and diarrhea.  Musculoskeletal: Negative for myalgias and myalgias.  Skin: Negative for sensitivity to sunlight.  Psychiatric/Behavioral: Negative for decreased concentration and sleep disturbance.    PMFS History:  Patient Active Problem List   Diagnosis Date Noted  . Rheumatoid arthritis involving multiple sites with positive rheumatoid factor (HCC) 01/25/2017  . High risk medication use 01/25/2017  . History of immunosuppression therapy 01/25/2017  . Fibromyalgia 01/25/2017  . Primary osteoarthritis of both hands 01/25/2017  . Spondylosis of lumbar region without myelopathy or radiculopathy 01/25/2017  . DJD (degenerative joint disease), cervical 01/25/2017  . History of neutropenia 01/25/2017  . History of coronary artery disease 01/25/2017  . History of hypertension 01/25/2017  . 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 Wears dentures     Past Medical History:  Diagnosis Date  . Arthritis   . Heart disease   . Hyperlipidemia   . Wears dentures    gum disease    Family History  Problem Relation Age of Onset  . Cancer Father   . Rheum arthritis Father    Past Surgical History:  Procedure Laterality Date  . CORONARY STENT PLACEMENT  08/2009  . LAPAROSCOPIC GASTRIC BANDING  04/05/2009   Social History   Social History Narrative  . No narrative on file     Objective: Vital Signs: BP  130/68   Pulse 68   Resp 16   Ht 5\' 4"  (1.626 m)   Wt 156 lb (70.8 kg)   BMI 26.78 kg/m    Physical Exam  Constitutional: She is oriented to person, place, and time. She appears well-developed and well-nourished.  HENT:  Head: Normocephalic and atraumatic.  Eyes: EOM are normal. Pupils are equal, round, and reactive to light.  Cardiovascular: Normal rate, regular rhythm and normal heart sounds.  Exam reveals no gallop and no friction rub.   No murmur heard. Pulmonary/Chest: Effort normal  and breath sounds normal. She has no wheezes. She has no rales.  Abdominal: Soft. Bowel sounds are normal. She exhibits no distension. There is no tenderness. There is no guarding. No hernia.  Musculoskeletal: Normal range of motion. She exhibits no edema, tenderness or deformity.  Lymphadenopathy:    She has no cervical adenopathy.  Neurological: She is alert and oriented to person, place, and time. Coordination normal.  Skin: Skin is warm and dry. Capillary refill takes less than 2 seconds. No rash noted.  Psychiatric: She has a normal mood and affect. Her behavior is normal.  Nursing note and vitals reviewed.    Musculoskeletal Exam:  Full range of motion of all joints Grip strength is equal and strong bilaterally Fibromyalgia tender points are 18 out of 18 positive today. Note that her fibromyalgia was about 12 out of 18 positive last visit  CDAI Exam: CDAI Homunculus Exam:   Tenderness:  Right hand: 2nd MCP and 3rd MCP Left hand: 2nd MCP  Swelling:  Right hand: 2nd MCP and 3rd MCP Left hand: 2nd MCP  Joint Counts:  CDAI Tender Joint count: 3 CDAI Swollen Joint count: 3  Global Assessments:  Patient Global Assessment: 8 Provider Global Assessment: 8  CDAI Calculated Score: 22 Synovial thickening and tenderness to palpation of right second and third MCP joint and left second MCP joint. There is DIP PIP prominence bilaterally   Investigation: Findings:  ENBREL AND HUMIRA SHE HAD INADEQUATE RESPONSE; AND ORENCIA SHE HAD HEADACHES AND NAUSEA.   February 2016: Negative TB gold  CBC and CMP from 09/29/2016 normal    Imaging: No results found.  Speciality Comments: No specialty comments available.    Procedures:  No procedures performed Allergies: Aspirin   Assessment / Plan:     Visit Diagnoses: High risk medication use - Plan: CBC with Differential/Platelet, COMPLETE METABOLIC PANEL WITH GFR, CBC with Differential/Platelet, COMPLETE METABOLIC PANEL WITH  GFR  Rheumatoid arthritis involving multiple sites with positive rheumatoid factor (HCC) - Plan: XR Hand 2 View Right, XR Hand 2 View Left, XR Foot 2 Views Right, XR Foot 2 Views Left, XR Ankle 2 Views Left, XR Ankle 2 Views Right  History of immunosuppression therapy - Plan: Quantiferon tb gold assay, Quantiferon tb gold assay  Fibromyalgia  Primary osteoarthritis of both hands - Plan: XR Hand 2 View Right, XR Hand 2 View Left  Spondylosis of lumbar region without myelopathy or radiculopathy  DJD (degenerative joint disease), cervical  History of neutropenia  History of coronary artery disease  History of hypertension  Pain in joints of both feet - Plan: XR Foot 2 Views Right, XR Foot 2 Views Left  Acute bilateral ankle pain  Chronic pain of both ankles - Plan: XR Ankle 2 Views Left, XR Ankle 2 Views Right   Plan: #1: Rheumatoid arthritis. Doing well except she is having a flare since she's been off of's L Chen's for week and a  half due to upper respiratory infection. As a result we will give her prednisone taper. Prednisone 5 mg 4 pills every morning for 4 days, 3 pills for 4 days, 2 pills for 4 days, 1 pill for 4 days, half pill 4 days, stop. We will (prescription for the patient  #2: Print prescription for Voltaren gel 3 tubes with 3 refills. Patient knows how to use the medication.  #3: Restart Xalatan's as soon as infection has resolved.  #4: Patient will get x-rays of bilateral hands, bilateral feet, bilateral ankle joint at next visit.   #5:  Return to clinic in 5 months   Orders: Orders Placed This Encounter  Procedures  . XR Hand 2 View Right  . XR Hand 2 View Left  . XR Foot 2 Views Right  . XR Foot 2 Views Left  . XR Ankle 2 Views Left  . XR Ankle 2 Views Right  . CBC with Differential/Platelet  . COMPLETE METABOLIC PANEL WITH GFR  . Quantiferon tb gold assay   Meds ordered this encounter  Medications  . diclofenac sodium (VOLTAREN) 1 % GEL    Sig:  Voltaren Gel 3 grams to 3 large joints upto TID 3 TUBES with 3 refills    Dispense:  3 Tube    Refill:  3    Voltaren Gel 3 grams to 3 large joints upto TID 3 TUBES with 3 refills    Order Specific Question:   Supervising Provider    Answer:   Pollyann Savoy [[id number]]  . predniSONE (DELTASONE) 5 MG tablet    Sig: OK to Prescribe  Prednisone 5mg :  4po qAM x 4 days,  3po qAM x 4 days, 2po qAM x 4 days,  1po qAM x 4 days, 1/2po qAM x 4 days, then stop.; disp 42 pills w/ no refills.  Take until all gone and exactly as Rx'd. If diabetic, watch sugar levels and have pcp make adjustments for better control if needed.    Dispense:  42 tablet    Refill:  0    Order Specific Question:   Supervising Provider    Answer:   Pollyann Savoy [phone]    Face-to-face time spent with patient was 30 minutes. 50% of time was spent in counseling and coordination of care.  Follow-Up Instructions: Return in about 5 months (around 06/24/2017) for RA,XELJANZ(on hold x wks);FMS,HAND PAIN (flare);Marland Kitchen   Tawni Pummel, PA-C  Note - This record has been created using [employer].  Chart creation errors have been sought, but may not always  have been located. Such creation errors do not reflect on  the standard of medical care.

## 2017-01-25 NOTE — Progress Notes (Signed)
Rheumatology Medication Review by a Pharmacist Does the patient feel that his/her medications are working for him/her?  Yes Has the patient been experiencing any side effects to the medications prescribed?  No Does the patient have any problems obtaining medications?  No  Issues to address at subsequent visits: None   Pharmacist comments:  Vanessa Collins is a 71 yo F who presents for follow up of rheumatoid arthritis.  She is currently taking Xeljanz 5 mg BID.  She reports she is no longer taking methotrexate.  She states she has been off [place] for the past week and a half due to a cold.  Reviewed with patient that Harriette Ohara should be held while she is sick.  Advised that Harriette Ohara should not be restarted until she has returned to baseline.  Patient voiced understanding.  Also reviewed importance of annual influenza vaccine.  Patient confirms she had one this year and had a pneumonia vaccine prior to initiation of Xeljanz.  Reviewed that live vaccines should be avoided while on Xeljanz.    Patient's most recent standing labs were on 09/29/16 which were normal.  Patient is due for standing labs today.  Patient is also due for TB Gold as most recent TB Gold was on 02/04/16.  Patient had lipid panel done at her primary care's office on 11/08/16.  I was able to see those results through [facility].  Total cholesterol was 153, LDL 61, HDL 77, TG 77. Patient denies any questions or concerns regarding her medications at this time.    Vanessa Collins, Pharm.D., BCPS, CPP Clinical Pharmacist Pager: [phone] Phone: [phone] 01/25/2017 3:21 PM

## 2017-01-26 LAB — COMPLETE METABOLIC PANEL WITH GFR
ALT: 12 U/L (ref 6–29)
AST: 16 U/L (ref 10–35)
Albumin: 4 g/dL (ref 3.6–5.1)
Alkaline Phosphatase: 28 U/L — ABNORMAL LOW (ref 33–130)
BILIRUBIN TOTAL: 0.5 mg/dL (ref 0.2–1.2)
BUN: 22 mg/dL (ref 7–25)
CALCIUM: 10 mg/dL (ref 8.6–10.4)
CHLORIDE: 102 mmol/L (ref 98–110)
CO2: 32 mmol/L — ABNORMAL HIGH (ref 20–31)
CREATININE: 0.9 mg/dL (ref 0.50–0.99)
GFR, EST AFRICAN AMERICAN: 76 mL/min (ref >=60)
GFR, Est Non African American: 66 mL/min (ref >=60)
Glucose, Bld: 100 mg/dL — ABNORMAL HIGH (ref 65–99)
Potassium: 3.6 mmol/L (ref 3.5–5.3)
Sodium: 144 mmol/L (ref 135–146)
TOTAL PROTEIN: 6.8 g/dL (ref 6.1–8.1)

## 2017-01-27 LAB — QUANTIFERON TB GOLD ASSAY (BLOOD)
INTERFERON GAMMA RELEASE ASSAY: NEGATIVE
Mitogen-Nil: 6.72 IU/mL
Quantiferon Nil Value: 0.02 IU/mL
Quantiferon Tb Ag Minus Nil Value: 0 IU/mL

## 2017-02-01 ENCOUNTER — Encounter: Payer: Self-pay | Admitting: Rheumatology

## 2017-02-01 NOTE — Progress Notes (Signed)
   [facility] ==> Drug safety Recommendation regarding Celebrex and hypertension  [facility] said that the Celebrex may cause your hypertension to worsen or if you don't have hypertension a can cause hypertension. And hypertension can cause serious cardiovascular events[ like stroke and heart attack.].  They wanted me to remind you to use caution when using Celebrex. Use the lowest dose of Celebrex if possible. If he can stop the Celebrex altogether, that is what they recommend.  We wanted to make you aware of their recommendation.

## 2017-02-05 ENCOUNTER — Telehealth: Payer: Self-pay | Admitting: *Deleted

## 2017-02-05 NOTE — Telephone Encounter (Signed)
-----   Message from Caffie Damme, RT sent at 02/02/2017  8:05 AM EST ----- Regarding: FW: Drug safety recommendation for Celebrex and hypertension   ----- Message ----- From: Tawni Pummel, PA-C Sent: 02/01/2017   5:54 PM To: Amy Carlynn Purl, RT Subject: Drug safety recommendation for Celebrex and #   Please inform patient:  [facility] ==> Drug safety Recommendation regarding Celebrex and hypertension  [facility] said that the Celebrex may cause your hypertension to worsen or if you don't have hypertension a can cause hypertension. And hypertension can cause serious cardiovascular events[ like stroke and heart attack.].  They wanted me to remind you to use caution when using Celebrex. Use the lowest dose of Celebrex if possible. If he can stop the Celebrex altogether, that is what they recommend.  We wanted to make you aware of their recommendation.

## 2017-02-05 NOTE — Telephone Encounter (Signed)
Patient advised of recommendation and states she only takes Celebrex 100 mg daily. Patient states she is unable to stop it at this time and is appreciative for this information.

## 2017-02-09 ENCOUNTER — Telehealth: Payer: Self-pay | Admitting: Rheumatology

## 2017-02-09 NOTE — Telephone Encounter (Signed)
Patient called and is wanting her medication updated with [facility].  She is needing medication.  Cb#[phone].  Thank you.

## 2017-02-12 NOTE — Telephone Encounter (Signed)
Attempted to contact the patient and unable to leave a message, mailbox is full.  

## 2017-02-14 MED ORDER — TOFACITINIB CITRATE 5 MG PO TABS: ORAL_TABLET | ORAL | 2 refills | Status: DC

## 2017-02-14 NOTE — Telephone Encounter (Signed)
Prescription sent to the pharmacy and patient advised. Patient advised she may come by the office to pick up sample.

## 2017-02-14 NOTE — Telephone Encounter (Signed)
Yes, please offer her one month's worth so she can continue her meds and okay to send in her refill prescription to [facility].As you noted, her labs are normal as a FEB 2018

## 2017-02-14 NOTE — Addendum Note (Signed)
Addended by: Henriette Combs on: 02/14/2017 03:55 PM   Modules accepted: Orders

## 2017-02-14 NOTE — Telephone Encounter (Signed)
Patient states she needs a refill on her Xeljanz sent [facility]. Patient is out of her medication. Can we provide patient with a sample while she is waitng for her medication to be shipped?   Last Visit: 01/25/17 Next Visit: 06/21/17 Labs: 01/25/17 WNL  Okay to refill Harriette Ohara?

## 2017-03-08 ENCOUNTER — Telehealth: Payer: Self-pay | Admitting: Pharmacist

## 2017-03-08 NOTE — Telephone Encounter (Signed)
Received a letter from patient's insurance regarding adverse drug interaction between ciprofloxacin and prednisone (increased risk of tendinopathy).  Patient was placed on ciprofloxacin prescription by Rhetta Mura, PA.  Patient reports she has completed the course of ciprofloxacin and did not report any concerns.  Reviewed with patient that Harriette Ohara should be held during infection and patient confirms she did hold the medication while on antibiotic.    Reviewed patient's chart and noted that both prednisone and celecoxib are listed on her medication list.  Patient reports she is taking prednisone 5 mg daily and celecoxib 200 mg daily.  I advised patient that celecoxib should be avoided while on prednisone due to the increased risk of GI bleed.  Patient voiced understanding and reports she will stop celecoxib.    Noted a sample of Harriette Ohara was signed out for patient in February of 2018 that patient did not pick up.  Patient confirms she does not need the sample anymore as she was able to get the medication from her pharmacy.  Patient denies any further questions or concerns regarding her medications at this time.    Lilla Shook, Pharm.D., BCPS, CPP Clinical Pharmacist Pager: [phone] Phone: [phone] 03/08/2017 4:28 PM

## 2017-03-13 ENCOUNTER — Other Ambulatory Visit: Payer: Self-pay | Admitting: Rheumatology

## 2017-03-13 ENCOUNTER — Telehealth: Payer: Self-pay | Admitting: Rheumatology

## 2017-03-13 NOTE — Telephone Encounter (Signed)
Patient calling re Bone Density this Thursday.  She is asking where and how to get in touch because she will need to cancel as there has been a death in the family.

## 2017-03-13 NOTE — Telephone Encounter (Signed)
Have we scheduled Bone density scan for her? I do not see this in her chart review?

## 2017-03-13 NOTE — Telephone Encounter (Signed)
Last Visit 01/25/17 Next Visit 06/21/17  Okay to refill Prednisone?

## 2017-04-05 ENCOUNTER — Other Ambulatory Visit: Payer: Self-pay | Admitting: Physician Assistant

## 2017-04-05 DIAGNOSIS — Z1231 Encounter for screening mammogram for malignant neoplasm of breast: Secondary | ICD-10-CM

## 2017-04-18 ENCOUNTER — Telehealth: Payer: Self-pay

## 2017-04-18 NOTE — Telephone Encounter (Signed)
Received a conformation from Cover [employer] Med[employer] regarding a prior authorization DENIAL for Voltaren Gel.   Reference [id number] Phone [phone]   Spoke with patient to update her. Told her about the GoodRx coupon on-line to help lower the cost. Patient voices understanding and denies any questions at this time.  Billiejean Schimek, [place], CPhT  12:10 PM

## 2017-04-21 ENCOUNTER — Other Ambulatory Visit: Payer: Self-pay | Admitting: Rheumatology

## 2017-04-23 NOTE — Telephone Encounter (Signed)
01/25/17 last visit  06/21/17 next visit  Labs CBC CMP normal 01/30/17 Last TB gold also done 01/30/17 Called patient to remind her labs are due next week. Her mail box is full.  Ok to refill per Dr Corliss Skains

## 2017-04-24 ENCOUNTER — Telehealth: Payer: Self-pay

## 2017-04-24 NOTE — Telephone Encounter (Signed)
Received another prior authorization request from the pharmacy for voltaren gel. The request has been denied as of 04/17/17. Spoke with Glee Arvin, who made documentation of the denial. Informed her that the patient has already been made aware.   Arby Dahir, [place], CPhT 10:16 AM

## 2017-04-26 ENCOUNTER — Ambulatory Visit: Payer: BLUE CROSS/BLUE SHIELD

## 2017-04-30 ENCOUNTER — Other Ambulatory Visit: Payer: Self-pay | Admitting: Rheumatology

## 2017-04-30 NOTE — Telephone Encounter (Signed)
ok 

## 2017-04-30 NOTE — Telephone Encounter (Signed)
01/25/17 last visit  06/21/17 next visit  Labs CBC CMP normal 01/30/17  Okay to refill Celebrex?

## 2017-05-14 ENCOUNTER — Ambulatory Visit: Payer: BLUE CROSS/BLUE SHIELD

## 2017-05-14 NOTE — Telephone Encounter (Signed)
Order for DEXA was placed in [facility] 07/2016, DEXA order will expire 07/2017, Garald Braver has been trying to contact patient since 07/2016, voicemail box full and will not accept calls, I spoke to patient who said she was on vacation and will call to schedule DEXA and she has the phone #.

## 2017-05-22 ENCOUNTER — Ambulatory Visit: Payer: BLUE CROSS/BLUE SHIELD

## 2017-06-14 NOTE — Progress Notes (Deleted)
Office Visit Note  Patient: Vanessa Collins             Date of Birth: 1946-12-07           MRN: [id number]             PCP: Clovis Riley, L.August Saucer, MD Referring: Clovis Riley, Elbert Ewings.August Saucer, MD Visit Date: 06/15/2017 Occupation: @GUAROCC @    Subjective:  No chief complaint on file.   History of Present Illness: Vanessa Collins is a 71 y.o. female ***   Activities of Daily Living:  Patient reports morning stiffness for *** {minute/hour:19697}.   Patient {ACTIONS;DENIES/REPORTS:21021675::"Denies"} nocturnal pain.  Difficulty dressing/grooming: {ACTIONS;DENIES/REPORTS:21021675::"Denies"} Difficulty climbing stairs: {ACTIONS;DENIES/REPORTS:21021675::"Denies"} Difficulty getting out of chair: {ACTIONS;DENIES/REPORTS:21021675::"Denies"} Difficulty using hands for taps, buttons, cutlery, and/or writing: {ACTIONS;DENIES/REPORTS:21021675::"Denies"}   No Rheumatology ROS completed.   PMFS History:  Patient Active Problem List   Diagnosis Date Noted  . Rheumatoid arthritis involving multiple sites with positive rheumatoid factor (HCC) 01/25/2017  . High risk medication use 01/25/2017  . History of immunosuppression therapy 01/25/2017  . Fibromyalgia 01/25/2017  . Primary osteoarthritis of both hands 01/25/2017  . Spondylosis of lumbar region without myelopathy or radiculopathy 01/25/2017  . DJD (degenerative joint disease), cervical 01/25/2017  . History of neutropenia 01/25/2017  . History of coronary artery disease 01/25/2017  . History of hypertension 01/25/2017  . 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 Wears dentures     Past Medical History:  Diagnosis Date  . Arthritis   . Heart disease   . Hyperlipidemia   . Wears dentures    gum disease    Family History  Problem Relation Age of Onset  . Cancer Father   . Rheum arthritis Father    Past Surgical History:  Procedure Laterality Date  . CORONARY STENT PLACEMENT  08/2009  . LAPAROSCOPIC GASTRIC BANDING  04/05/2009   Social History    Social History Narrative  . No narrative on file     Objective: Vital Signs: There were no vitals taken for this visit.   Physical Exam   Musculoskeletal Exam: ***  CDAI Exam: No CDAI exam completed.    Investigation: Findings:  01/25/2017 negative TB gold   Inadequate response in the past to Humira, Enbrel, and headaches with Orencia on Xeljanz and Methotrexate  CBC Latest Ref Rng & Units 01/25/2017 09/14/2009 09/13/2009  WBC 3.8 - 10.8 K/uL 7.9 5.3 4.7  Hemoglobin 11.7 - 15.5 g/dL 09/15/2009 11.2(L) 12.1  Hematocrit 35.0 - 45.0 % 37.1 32.8(L) 35.5(L)  Platelets 140 - 400 K/uL 208 148(L) 146(L)    CMP Latest Ref Rng & Units 01/25/2017 09/14/2009 09/13/2009  Glucose 65 - 99 mg/dL 09/15/2009) 742(V) 956(L)  BUN 7 - 25 mg/dL 22 11 21   Creatinine 0.50 - 0.99 mg/dL 875(I 4.33  Sodium 135 - 146 mmol/L 144 140 137  Potassium 3.5 - 5.3 mmol/L 3.6 3.9 3.3(L)  Chloride 98 - 110 mmol/L 102 109 100  CO2 20 - 31 mmol/L 32(H) 27 30  Calcium 8.6 - 10.4 mg/dL 2.95 1.88) 8.8  Total Protein 6.1 - 8.1 g/dL 6.8 - -  Total Bilirubin 0.2 - 1.2 mg/dL 0.5 - -  Alkaline Phos 33 - 130 U/L 28(L) - -  AST 10 - 35 U/L 16 - -  ALT 6 - 29 U/L 12 - -    Imaging: No results found.  Speciality Comments: No specialty comments available.    Procedures:  No procedures performed Allergies: Aspirin   Assessment / Plan:  Visit Diagnoses: Rheumatoid arthritis involving multiple sites with positive rheumatoid factor (HCC)  + RF +CCP   High risk medication use /had inadeq response to Humira and Enbrel,Orencia +headaches and nausea. 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  No orders of the defined types were placed in this encounter.   Face-to-face time spent with patient was *** minutes. 50% of time was spent in counseling and coordination of care.  Follow-Up Instructions: No Follow-up on file.   Conlin Brahm, RT  Note - This record has been created using [employer].  Chart creation errors have been sought, but may not always  have been located. Such creation errors do not reflect on  the standard of medical care.

## 2017-06-15 ENCOUNTER — Ambulatory Visit: Payer: BLUE CROSS/BLUE SHIELD | Admitting: Rheumatology

## 2017-06-15 NOTE — Progress Notes (Signed)
Office Visit Note  Patient: Vanessa Collins             Date of Birth: December 16, 1946           MRN: [id number]             PCP: Clovis Riley, L.August Saucer, MD Referring: Clovis Riley, Elbert Ewings.August Saucer, MD Visit Date: 06/20/2017 Occupation: @GUAROCC @    Subjective:  Joint pain   History of Present Illness: Vanessa Collins is a 71 y.o. female with history of sero positive rheumatoid arthritis. She states she has had intermittent joint pain. She's been having some discomfort in her right little finger tip. She states it feels numb at times. she continues to have discomfort in her bilateral ankles. She does have some flares of fibromyalgia with generalized pain. She continues to have discomfort in her lower back. Patient reports that she had 2 episodes of urinary tract infection 2018.   Activities of Daily Living:  Patient reports morning stiffness for 4 hours.   Patient Denies nocturnal pain.  Difficulty dressing/grooming: Denies Difficulty climbing stairs: Denies Difficulty getting out of chair: Denies Difficulty using hands for taps, buttons, cutlery, and/or writing: Denies   Review of Systems  Constitutional: Positive for fatigue. Negative for night sweats, weight gain, weight loss and weakness.  HENT: Negative for mouth sores, trouble swallowing, trouble swallowing, mouth dryness and nose dryness.   Eyes: Negative for pain, redness, visual disturbance and dryness.  Respiratory: Negative for cough, shortness of breath and difficulty breathing.   Cardiovascular: Positive for hypertension. Negative for chest pain, palpitations, irregular heartbeat and swelling in legs/feet.  Gastrointestinal: Negative for blood in stool, constipation and diarrhea.  Endocrine: Negative for increased urination.  Genitourinary: Negative for vaginal dryness.  Musculoskeletal: Positive for arthralgias, joint pain, myalgias, morning stiffness and myalgias. Negative for joint swelling, muscle weakness and muscle tenderness.  Skin:  Negative for color change, rash, hair loss, skin tightness, ulcers and sensitivity to sunlight.  Allergic/Immunologic: Negative for susceptible to infections.  Neurological: Negative for dizziness, memory loss and night sweats.  Hematological: Negative for swollen glands.  Psychiatric/Behavioral: Negative for depressed mood and sleep disturbance. The patient is not nervous/anxious.     PMFS History:  Patient Active Problem List   Diagnosis Date Noted  . History of depression 06/19/2017  . Rheumatoid arthritis involving multiple sites with positive rheumatoid factor (HCC) 01/25/2017  . High risk medication use 01/25/2017  . Immunosuppression (HCC) 01/25/2017  . Fibromyalgia 01/25/2017  . Primary osteoarthritis of both hands 01/25/2017  . Spondylosis of lumbar region without myelopathy or radiculopathy 01/25/2017  . DJD (degenerative joint disease), cervical 01/25/2017  . History of neutropenia 01/25/2017  . History of coronary artery disease 01/25/2017  . History of hypertension 01/25/2017  . 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 Wears dentures     Past Medical History:  Diagnosis Date  . Arthritis   . Heart disease   . Hyperlipidemia   . Wears dentures    gum disease    Family History  Problem Relation Age of Onset  . Cancer Father   . Rheum arthritis Father    Past Surgical History:  Procedure Laterality Date  . CORONARY STENT PLACEMENT  08/2009  . LAPAROSCOPIC GASTRIC BANDING  04/05/2009   Social History   Social History Narrative  . No narrative on file     Objective: Vital Signs: BP 120/64   Pulse 60   Resp 16   Ht 5\' 3"  (1.6 m)   Wt 152  lb (68.9 kg)   BMI 26.93 kg/m    Physical Exam  Constitutional: She is oriented to person, place, and time. She appears well-developed and well-nourished.  HENT:  Head: Normocephalic and atraumatic.  Eyes: Conjunctivae and EOM are normal.  Neck: Normal range of motion.  Cardiovascular: Normal rate, regular rhythm,  normal heart sounds and intact distal pulses.   Pulmonary/Chest: Effort normal and breath sounds normal.  Abdominal: Soft. Bowel sounds are normal.  Lymphadenopathy:    She has no cervical adenopathy.  Neurological: She is alert and oriented to person, place, and time.  Skin: Skin is warm and dry. Capillary refill takes less than 2 seconds.  Psychiatric: She has a normal mood and affect. Her behavior is normal.  Nursing note and vitals reviewed.    Musculoskeletal Exam: C-spine and thoracic lumbar spine good range of motion. Shoulder joints elbow joints, wrist joints, MCPs, PIPs DIPs with good range of motion. She has some thickening of her right fifth finger DIP joint. Hip joints, knee joints, ankles, MTPs PIPs with good range of motion. She is some thickening of her left ankle joint. But no synovitis was noted. Fibromyalgia tender points were 10 out of 18 positive.  CDAI Exam: CDAI Homunculus Exam:   Joint Counts:  CDAI Tender Joint count: 0 CDAI Swollen Joint count: 0  Global Assessments:  Patient Global Assessment: 5 Provider Global Assessment: 2  CDAI Calculated Score: 7    Investigation: Findings:  Inadequate response in the past to Humira, Enbrel, and headaches with Orencia. Patient currently on Xeljanz and Methotrexate   TB gold negative 01/25/2017  DEXA October 2014 normal    Imaging: No results found.  Speciality Comments: No specialty comments available. CBC Latest Ref Rng & Units 01/25/2017 09/14/2009 09/13/2009  WBC 3.8 - 10.8 K/uL 7.9 5.3 4.7  Hemoglobin 11.7 - 15.5 g/dL 17.5 11.2(L) 12.1  Hematocrit 35.0 - 45.0 % 37.1 32.8(L) 35.5(L)  Platelets 140 - 400 K/uL 208 148(L) 146(L)   CMP     Component Value Date/Time   NA 144 01/25/2017 1402   K 3.6 01/25/2017 1402   CL 102 01/25/2017 1402   CO2 32 (H) 01/25/2017 1402   GLUCOSE 100 (H) 01/25/2017 1402   BUN 22 01/25/2017 1402   CREATININE 0.90 01/25/2017 1402   CALCIUM 10.0 01/25/2017 1402   PROT 6.8  01/25/2017 1402   ALBUMIN 4.0 01/25/2017 1402   AST 16 01/25/2017 1402   ALT 12 01/25/2017 1402   ALKPHOS 28 (L) 01/25/2017 1402   BILITOT 0.5 01/25/2017 1402   GFRNONAA 66 01/25/2017 1402   GFRAA 76 01/25/2017 1402     Procedures:  No procedures performed Allergies: Aspirin   Assessment / Plan:     Visit Diagnoses: Rheumatoid arthritis involving multiple sites with positive rheumatoid factor (HCC) - Positive RF, positive anti-CCP. She complains of arthralgias. I do not see any synovitis on examination today. She appears to be doing quite well on the current combination of medications. She's tried tapering Xeljanz in the past and could not tolerate that. She's also taking low-dose prednisone 2.5 mg by mouth daily. She does not want to taper any of those medications.  High risk medication use - Xeljanz 5 mg by mouth twice a day , prednisone 2. 5 mg by mouth daily. Her labs are past-due. We will schedule labs today. She's been advised to get labs every 3 months to monitor for drug toxicity.  Primary osteoarthritis of both hands: Joint protection and muscle strengthening  was discussed.  DJD (degenerative joint disease), cervical: Minimal discomfort  DDD lumbar spine: Doing well  Fibromyalgia: She continues to have frequent flares  History of coronary artery disease  History of hypertension: Blood pressure is well controlled  History of neutropenia: W BC count is better partly because she is on prednisone.  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 CBC with Differential/Platelet  . Comprehensive metabolic panel   No orders of the defined types were placed in this encounter.   Face-to-face time spent with patient was 30 minutes. 50% of time was spent in counseling and coordination of care.  Follow-Up Instructions: Return in about 5 months (around 11/20/2017) for Rheumatoid arthritis OA DDD FMS.   Pollyann Savoy, MD  Note - This record has been created using [employer].  Chart creation errors have been sought, but may not always  have been located. Such creation errors do not reflect on  the standard of medical care.

## 2017-06-19 DIAGNOSIS — Z8659 Personal history of other mental and behavioral disorders: Secondary | ICD-10-CM | POA: Insufficient documentation

## 2017-06-20 ENCOUNTER — Ambulatory Visit (INDEPENDENT_AMBULATORY_CARE_PROVIDER_SITE_OTHER): Payer: BLUE CROSS/BLUE SHIELD | Admitting: Rheumatology

## 2017-06-20 ENCOUNTER — Encounter: Payer: Self-pay | Admitting: Rheumatology

## 2017-06-20 VITALS — BP 120/64 | HR 60 | Resp 16 | Ht 63.0 in | Wt 152.0 lb

## 2017-06-20 DIAGNOSIS — M19042 Primary osteoarthritis, left hand: Secondary | ICD-10-CM | POA: Diagnosis not present

## 2017-06-20 DIAGNOSIS — M19041 Primary osteoarthritis, right hand: Secondary | ICD-10-CM

## 2017-06-20 DIAGNOSIS — M0579 Rheumatoid arthritis with rheumatoid factor of multiple sites without organ or systems involvement: Secondary | ICD-10-CM | POA: Diagnosis not present

## 2017-06-20 DIAGNOSIS — M47812 Spondylosis without myelopathy or radiculopathy, cervical region: Secondary | ICD-10-CM

## 2017-06-20 DIAGNOSIS — Z79899 Other long term (current) drug therapy: Secondary | ICD-10-CM | POA: Diagnosis not present

## 2017-06-20 DIAGNOSIS — Z8659 Personal history of other mental and behavioral disorders: Secondary | ICD-10-CM | POA: Diagnosis not present

## 2017-06-20 DIAGNOSIS — Z862 Personal history of diseases of the blood and blood-forming organs and certain disorders involving the immune mechanism: Secondary | ICD-10-CM

## 2017-06-20 DIAGNOSIS — M5030 Other cervical disc degeneration, unspecified cervical region: Secondary | ICD-10-CM | POA: Diagnosis not present

## 2017-06-20 DIAGNOSIS — M47816 Spondylosis without myelopathy or radiculopathy, lumbar region: Secondary | ICD-10-CM

## 2017-06-20 DIAGNOSIS — M797 Fibromyalgia: Secondary | ICD-10-CM | POA: Diagnosis not present

## 2017-06-20 DIAGNOSIS — Z9884 Bariatric surgery status: Secondary | ICD-10-CM | POA: Diagnosis not present

## 2017-06-20 DIAGNOSIS — Z8679 Personal history of other diseases of the circulatory system: Secondary | ICD-10-CM | POA: Diagnosis not present

## 2017-06-20 LAB — CBC WITH DIFFERENTIAL/PLATELET
BASOS PCT: 1 %
Basophils Absolute: 52 cells/uL (ref 0–200)
EOS ABS: 52 {cells}/uL (ref 15–500)
Eosinophils Relative: 1 %
HEMATOCRIT: 36.9 % (ref 35.0–45.0)
HEMOGLOBIN: 12.3 g/dL (ref 11.7–15.5)
LYMPHS ABS: 1300 {cells}/uL (ref 850–3900)
Lymphocytes Relative: 25 %
MCH: 32.5 pg (ref 27.0–33.0)
MCHC: 33.3 g/dL (ref 32.0–36.0)
MCV: 97.4 fL (ref 80.0–100.0)
MONO ABS: 364 {cells}/uL (ref 200–950)
MPV: 11.3 fL (ref 7.5–12.5)
Monocytes Relative: 7 %
NEUTROS ABS: 3432 {cells}/uL (ref 1500–7800)
Neutrophils Relative %: 66 %
Platelets: 211 10*3/uL (ref 140–400)
RBC: 3.79 MIL/uL — ABNORMAL LOW (ref 3.80–5.10)
RDW: 13.7 % (ref 11.0–15.0)
WBC: 5.2 10*3/uL (ref 3.8–10.8)

## 2017-06-20 NOTE — Patient Instructions (Signed)
Standing Labs We placed an order today for your standing lab work.    Please come back and get your standing labs in September and every 3 months  We have open lab Monday through Friday from 8:30-11:30 AM and 1:30-4 PM at the office of Dr. Providencia Hottenstein/Naitik Panwala, PA.   The office is located at [address], [address], [place], [place] [address] No appointment is necessary.   Labs are drawn by [facility].  You may receive a bill from [facility] for your lab work. If you have any questions regarding directions or hours of operation,  please call [phone].    

## 2017-06-21 ENCOUNTER — Ambulatory Visit: Payer: BLUE CROSS/BLUE SHIELD | Admitting: Rheumatology

## 2017-06-21 LAB — COMPREHENSIVE METABOLIC PANEL
ALBUMIN: 4.1 g/dL (ref 3.6–5.1)
ALK PHOS: 33 U/L (ref 33–130)
ALT: 26 U/L (ref 6–29)
AST: 23 U/L (ref 10–35)
BILIRUBIN TOTAL: 0.5 mg/dL (ref 0.2–1.2)
BUN: 16 mg/dL (ref 7–25)
CALCIUM: 9.6 mg/dL (ref 8.6–10.4)
CO2: 32 mmol/L — ABNORMAL HIGH (ref 20–31)
Chloride: 100 mmol/L (ref 98–110)
Creat: 0.92 mg/dL (ref 0.50–0.99)
Glucose, Bld: 96 mg/dL (ref 65–99)
Potassium: 4.1 mmol/L (ref 3.5–5.3)
Sodium: 140 mmol/L (ref 135–146)
TOTAL PROTEIN: 6.6 g/dL (ref 6.1–8.1)

## 2017-06-22 ENCOUNTER — Telehealth: Payer: Self-pay | Admitting: Pharmacist

## 2017-06-22 NOTE — Telephone Encounter (Signed)
Received fax from patient's insurance regarding celecoxib and hypertension.  Reviewed patient's chart.  Her blood pressure appears to be very well controlled.  It was 120/64 at her visit on 06/20/17.    I called patient to discuss.  We had detailed discussion of celecoxib and purpose, proepr use, and adverse effects of the medications including the risk of cardiovascular disease.  Patient reports she tried to stop taking the medication but could not.  She confirms she is aware of the risk.    We also discussed that she is taking prednisone 2.5 mg daily and we usually do not recommend using NSAIDs and prednisone together.  Discussed the increased risk of gastrointestinal adverse effects including ulceration or bleeding.  Patient voiced understanding and confirms she is aware of the risk but she reports she cannot stop either the prednisone or the celecoxib without significant increase in pain.  I counseled patient on signs and symptoms if GI bleed and discussed seeking care if that occurs.  Patient voiced understanding and denies any further questions or concerns.    Lilla Shook, Pharm.D., BCPS, CPP Clinical Pharmacist Pager: [phone] Phone: [phone] 06/22/2017 2:51 PM

## 2017-06-25 ENCOUNTER — Other Ambulatory Visit: Payer: Self-pay | Admitting: Rheumatology

## 2017-06-25 NOTE — Telephone Encounter (Signed)
Last Visit: 06/20/17 Next Visit: 11/29/17 Labs: 06/20/17 WNL  Okay to refill per Dr. Deveshwar 

## 2017-07-08 ENCOUNTER — Other Ambulatory Visit: Payer: Self-pay | Admitting: Rheumatology

## 2017-07-09 NOTE — Telephone Encounter (Signed)
Last Visit: 06/20/17 Next Visit: 11/29/17 Labs: 06/20/17 WNL  Okay to refill per Dr. Corliss Skains

## 2017-09-03 ENCOUNTER — Other Ambulatory Visit: Payer: Self-pay | Admitting: Rheumatology

## 2017-09-24 HISTORY — PX: COLECTOMY WITH COLOSTOMY CREATION/HARTMANN PROCEDURE: SHX6598

## 2017-10-01 ENCOUNTER — Other Ambulatory Visit: Payer: Self-pay | Admitting: Rheumatology

## 2017-10-01 NOTE — Telephone Encounter (Signed)
Last Visit: 06/20/17 Next Visit: 11/29/17 Labs: 06/20/17 WNL  Okay to refill per Dr. Deveshwar 

## 2017-10-05 ENCOUNTER — Other Ambulatory Visit: Payer: Self-pay | Admitting: Rheumatology

## 2017-10-05 NOTE — Telephone Encounter (Signed)
Last Visit: 06/20/17 Next Visit: 11/29/17 Labs: 06/20/17 WNL  Attempted to contact patient to advise patient is due for labs, unable to leave message. Mailbox is full.   Ok to refill 30 day supply, per Dr. Corliss Skains.

## 2017-10-26 ENCOUNTER — Telehealth: Payer: Self-pay

## 2017-10-26 NOTE — Telephone Encounter (Signed)
Called patient about fax from [facility] in regards to taking celebrex as needed. Patient verbalized understanding.   Patient states her colon ruptured about 3 weeks ago while she was in [place]. 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  Patient is not taking xeljanz right now due to the surgery and ostomy placement, per surgeons request. Patient will have those records faxed to the office.

## 2017-10-29 NOTE — Telephone Encounter (Signed)
I called patient and had detailed discussion regarding her situation. She understands that she would not be able to go back on [place].Increased risk of intestinal rupture with Harriette Ohara was discussed. We discussed possibly starting on methotrexate at follow-up visit. I've advised her to get a clearance letter from her surgeon.

## 2017-11-02 ENCOUNTER — Telehealth: Payer: Self-pay

## 2017-11-02 NOTE — Telephone Encounter (Signed)
Patient advised that we have not received the clearance letter from Dr. Ezzard Standing. Patient will call the doctor's office to follow up/

## 2017-11-02 NOTE — Telephone Encounter (Signed)
Patient called concerning an authorization for MTX from Dr. Ezzard Standing.  Doctor's phone number is [phone].  Cb# is [id number].  Please advise.  Thank You.  [facility].

## 2017-11-05 ENCOUNTER — Encounter (HOSPITAL_COMMUNITY): Payer: Self-pay

## 2017-11-05 NOTE — Telephone Encounter (Signed)
Patient called stating that Dr. Allene Pyo office refaxed the clearance letter on Friday and she is wanting to know if we have received it.  Also she is needing an RX refill on her Celebrex.  CB#[phone].  Thank you.

## 2017-11-07 ENCOUNTER — Telehealth: Payer: Self-pay | Admitting: Rheumatology

## 2017-11-07 NOTE — Telephone Encounter (Signed)
Latoya from [facility] calling requesting rx for patients MTX. Per pharmacy patient has been expecting rx to be sent in two days now. Please advise.

## 2017-11-08 NOTE — Telephone Encounter (Signed)
Chart review: Patient was on methotrexate at 1.0 mL subcutaneous every week from 2013 until 2015. We will have to reschedule a visit to get a consent on methotrexate. She will need baseline labs. And then we can start her on methotrexate at 0.6 mL every week and increase it gradually as tolerated based on the lab results.

## 2017-11-08 NOTE — Telephone Encounter (Signed)
Patient advised we need her to come by the office to complete the consent form and baseline labs then we would be able to send in MTX.

## 2017-11-08 NOTE — Telephone Encounter (Signed)
We have received the clearance letter from her surgeon. What dose of MTX would you like to start her on?

## 2017-11-08 NOTE — Telephone Encounter (Signed)
Patient advised she needs to come to office for baselines lab

## 2017-11-09 ENCOUNTER — Telehealth: Payer: Self-pay | Admitting: Pharmacist

## 2017-11-09 ENCOUNTER — Other Ambulatory Visit: Payer: Self-pay | Admitting: *Deleted

## 2017-11-09 ENCOUNTER — Other Ambulatory Visit: Payer: Self-pay

## 2017-11-09 DIAGNOSIS — Z79899 Other long term (current) drug therapy: Secondary | ICD-10-CM

## 2017-11-09 LAB — COMPLETE METABOLIC PANEL WITH GFR
AG Ratio: 1.4 (calc) (ref 1.0–2.5)
ALBUMIN MSPROF: 3.9 g/dL (ref 3.6–5.1)
ALT: 9 U/L (ref 6–29)
AST: 13 U/L (ref 10–35)
Alkaline phosphatase (APISO): 38 U/L (ref 33–130)
BUN / CREAT RATIO: 23 (calc) — AB (ref 6–22)
BUN: 23 mg/dL (ref 7–25)
CALCIUM: 9.8 mg/dL (ref 8.6–10.4)
CHLORIDE: 101 mmol/L (ref 98–110)
CO2: 33 mmol/L — AB (ref 20–32)
CREATININE: 1.01 mg/dL — AB (ref 0.50–0.99)
GFR, EST AFRICAN AMERICAN: 66 mL/min/{1.73_m2} (ref >=60)
GFR, EST NON AFRICAN AMERICAN: 57 mL/min/{1.73_m2} — AB (ref >=60)
GLOBULIN: 2.7 g/dL (ref 1.9–3.7)
GLUCOSE: 104 mg/dL — AB (ref 65–99)
Potassium: 4.4 mmol/L (ref 3.5–5.3)
Sodium: 141 mmol/L (ref 135–146)
TOTAL PROTEIN: 6.6 g/dL (ref 6.1–8.1)
Total Bilirubin: 0.4 mg/dL (ref 0.2–1.2)

## 2017-11-09 LAB — CBC WITH DIFFERENTIAL/PLATELET
Basophils Absolute: 94 cells/uL (ref 0–200)
Basophils Relative: 1.2 %
EOS PCT: 1.5 %
Eosinophils Absolute: 117 cells/uL (ref 15–500)
HEMATOCRIT: 32.6 % — AB (ref 35.0–45.0)
Hemoglobin: 11.1 g/dL — ABNORMAL LOW (ref 11.7–15.5)
LYMPHS ABS: 1279 {cells}/uL (ref 850–3900)
MCH: 32.2 pg (ref 27.0–33.0)
MCHC: 34 g/dL (ref 32.0–36.0)
MCV: 94.5 fL (ref 80.0–100.0)
MPV: 11.4 fL (ref 7.5–12.5)
Monocytes Relative: 7.2 %
NEUTROS PCT: 73.7 %
Neutro Abs: 5749 cells/uL (ref 1500–7800)
Platelets: 247 10*3/uL (ref 140–400)
RBC: 3.45 10*6/uL — AB (ref 3.80–5.10)
RDW: 12.7 % (ref 11.0–15.0)
Total Lymphocyte: 16.4 %
WBC mixed population: 562 cells/uL (ref 200–950)
WBC: 7.8 10*3/uL (ref 3.8–10.8)

## 2017-11-09 NOTE — Telephone Encounter (Signed)
Patient was counseled on the purpose, proper use, and adverse effects of methotrexate including nausea, infection, and signs and symptoms of pneumonitis.  Reviewed instructions with patient to take methotrexate weekly along with folic acid daily.  Discussed the importance of frequent monitoring of kidney and liver function and blood counts, and provided patient with standing lab instructions.  Counseled patient to avoid NSAIDs and alcohol while on methotrexate.  Provided patient with educational materials on methotrexate and answered all questions.  Advised patient to get annual influenza vaccine and to get a pneumococcal vaccine if patient has not already had one.  Patient voiced understanding.  Patient consented to methotrexate use.  Will upload into chart.

## 2017-11-12 ENCOUNTER — Other Ambulatory Visit: Payer: Self-pay

## 2017-11-12 ENCOUNTER — Other Ambulatory Visit: Payer: Self-pay | Admitting: Rheumatology

## 2017-11-12 MED ORDER — "TUBERCULIN-ALLERGY SYRINGES 27G X 1/2"" 1 ML MISC": 3 refills | Status: DC

## 2017-11-12 MED ORDER — METHOTREXATE SODIUM CHEMO INJECTION 50 MG/2ML: 15.0000 mg | INTRAMUSCULAR | 0 refills | Status: DC

## 2017-11-12 MED ORDER — FOLIC ACID 1 MG PO TABS: 2.0000 mg | ORAL_TABLET | Freq: Every day | ORAL | 3 refills | Status: DC

## 2017-11-16 ENCOUNTER — Other Ambulatory Visit: Payer: Self-pay | Admitting: Rheumatology

## 2017-11-16 NOTE — Progress Notes (Signed)
Office Visit Note  Patient: Vanessa Collins             Date of Birth: January 03, 1946           MRN: [id number]             PCP: Clovis Riley, L.August Saucer, MD Referring: Clovis Riley, Elbert Ewings.August Saucer, MD Visit Date: 11/29/2017 Occupation: @GUAROCC @    Subjective: Pain in hands and ankles   History of Present Illness: Vanessa Collins is a 71 y.o. female with history of sero positive rheumatoid arthritis, osteoarthritis and fibromyalgia. She had colon rupture on 10/04/2017. She had partial colectomy and has colostomy now. She had eyelid surgery on 11/26/2017. She is gradually recovering from that. She's been experiencing increased pain in her bilateral hands and her bilateral ankles. She has not noticed any increased swelling. She continues to have some discomfort from fibromyalgia. Neck and lower back pain persist. She discontinued 14/02/2017 after the intestinal rupture. She's not been taking any medications currently except for prednisone 5 mg by mouth daily.   Activities of Daily Living:  Patient reports morning stiffness for 3-4 hours.   Patient Denies nocturnal pain.  Difficulty dressing/grooming: Denies Difficulty climbing stairs: Reports Difficulty getting out of chair: Reports Difficulty using hands for taps, buttons, cutlery, and/or writing: Reports   Review of Systems  Constitutional: Negative for fatigue, night sweats, weight gain, weight loss and weakness.  HENT: Negative for mouth sores, trouble swallowing, trouble swallowing, mouth dryness and nose dryness.   Eyes: Negative for pain, redness, visual disturbance and dryness.  Respiratory: Negative for cough, shortness of breath and difficulty breathing.   Cardiovascular: Negative for chest pain, palpitations, hypertension, irregular heartbeat and swelling in legs/feet.  Gastrointestinal: Negative for blood in stool, constipation and diarrhea.  Endocrine: Negative for increased urination.  Genitourinary: Negative for vaginal dryness.  Musculoskeletal:  Positive for arthralgias, joint pain and morning stiffness. Negative for joint swelling, myalgias, muscle weakness, muscle tenderness and myalgias.  Skin: Negative for color change, rash, hair loss, skin tightness, ulcers and sensitivity to sunlight.  Allergic/Immunologic: Negative for susceptible to infections.  Neurological: Negative for dizziness, memory loss and night sweats.  Hematological: Negative for swollen glands.  Psychiatric/Behavioral: Negative for depressed mood and sleep disturbance. The patient is not nervous/anxious.     PMFS History:  Patient Active Problem List   Diagnosis Date Noted  . History of depression 06/19/2017  . Rheumatoid arthritis involving multiple sites with positive rheumatoid factor (HCC) 01/25/2017  . High risk medication use 01/25/2017  . Immunosuppression (HCC) 01/25/2017  . Fibromyalgia 01/25/2017  . Primary osteoarthritis of both hands 01/25/2017  . Spondylosis of lumbar region without myelopathy or radiculopathy 01/25/2017  . DJD (degenerative joint disease), cervical 01/25/2017  . History of neutropenia 01/25/2017  . History of coronary artery disease 01/25/2017  . History of hypertension 01/25/2017  . 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 Wears dentures     Past Medical History:  Diagnosis Date  . Arthritis   . Heart disease   . Hyperlipidemia   . Wears dentures    gum disease    Family History  Problem Relation Age of Onset  . Macular degeneration Mother   . Cancer Father   . Rheum arthritis Father   . Bipolar disorder Daughter    Past Surgical History:  Procedure Laterality Date  . colostomy placement  09/2017  . CORONARY STENT PLACEMENT  08/2009  . EYE SURGERY    . LAPAROSCOPIC GASTRIC BANDING  04/05/2009  .  ruptured colon  09/2017   Social History   Social History Narrative  . Not on file     Objective: Vital Signs: BP 108/62 (BP Location: Left Arm, Patient Position: Sitting, Cuff Size: Normal)   Pulse 71   Resp 16    Ht 5\' 3"  (1.6 m)   Wt 152 lb (68.9 kg)   BMI 26.93 kg/m    Physical Exam  Constitutional: She is oriented to person, place, and time. She appears well-developed and well-nourished.  HENT:  Head: Normocephalic and atraumatic.  Eyes: Conjunctivae and EOM are normal.  Neck: Normal range of motion.  Cardiovascular: Normal rate, regular rhythm, normal heart sounds and intact distal pulses.  Pulmonary/Chest: Effort normal and breath sounds normal.  Abdominal: Soft. Bowel sounds are normal.  Colostomy bag  Lymphadenopathy:    She has no cervical adenopathy.  Neurological: She is alert and oriented to person, place, and time.  Skin: Skin is warm and dry. Capillary refill takes less than 2 seconds.  Psychiatric: She has a normal mood and affect. Her behavior is normal.  Nursing note and vitals reviewed.    Musculoskeletal Exam: C-spine and thoracic lumbar spine good range of motion. Shoulder joints elbow joints wrist joints are good range of motion. She is some synovial thickening and synovitis in her hands as described below. Hip joints knee joints ankles MTPs PIPs DIPs are good range of motion with no synovitis.  CDAI Exam: CDAI Homunculus Exam:   Tenderness:  Right hand: 2nd MCP and 3rd MCP Left hand: 2nd MCP and 3rd MCP RLE: tibiotalar LLE: tibiotalar  Swelling:  Left hand: 2nd MCP  Joint Counts:  CDAI Tender Joint count: 4 CDAI Swollen Joint count: 1  Global Assessments:  Patient Global Assessment: 8 Provider Global Assessment: 4  CDAI Calculated Score: 17    Investigation: No additional findings. CBC Latest Ref Rng & Units 11/09/2017 06/20/2017 01/25/2017  WBC 3.8 - 10.8 Thousand/uL 7.8 5.2 7.9  Hemoglobin 11.7 - 15.5 g/dL 11.1(L) 12.3 12.3  Hematocrit 35.0 - 45.0 % 32.6(L) 36.9 37.1  Platelets 140 - 400 Thousand/uL 247 211 208   CMP Latest Ref Rng & Units 11/09/2017 06/20/2017 01/25/2017  Glucose 65 - 99 mg/dL 197(J) 96 883(G)  BUN 7 - 25 mg/dL 23 16 22     Creatinine 0.50 - 0.99 mg/dL 5.49(I) 2.64 1.58  Sodium 135 - 146 mmol/L 141 140 144  Potassium 3.5 - 5.3 mmol/L 4.4 4.1 3.6  Chloride 98 - 110 mmol/L 101 100 102  CO2 20 - 32 mmol/L 33(H) 32(H) 32(H)  Calcium 8.6 - 10.4 mg/dL 9.8 9.6 30.9  Total Protein 6.1 - 8.1 g/dL 6.6 6.6 6.8  Total Bilirubin 0.2 - 1.2 mg/dL 0.4 0.5 0.5  Alkaline Phos 33 - 130 U/L - 33 28(L)  AST 10 - 35 U/L 13 23 16   ALT 6 - 29 U/L 9 26 12     Imaging: No results found.  Speciality Comments: No specialty comments available.    Procedures:  No procedures performed Allergies: Aspirin   Assessment / Plan:     Visit Diagnoses: Rheumatoid arthritis involving multiple sites with positive rheumatoid factor (HCC) - Positive RF, positive anti-CCP. Harriette Ohara was discontinued after colon rupture. She's been complaining of increase arthralgias and joint swelling. She has mild synovitis on examination today. She's doing fairly well without any immunosuppressive agent except for prednisone at a low-dose. She was unable to taper prednisone in the past. We had detailed discussion regarding different treatment options and  side effects. She was to go back on methotrexate. She would not be able to use xeljanz again. Indications CONTRAINDICATIONS were discussed at length. Informed consent was obtain. She was on methotrexate 0.8 ML subcutaneous in the past. I would start her on methotrexate 0.6 mL subcutaneous every week. She will need labs a month after starting methotrexate and then every 3 months to monitor for drug toxicity. If her symptoms are not controlled with 0.6 mL we may be able to increase it to 0.8 ML subcutaneous every week. She had problems with neutropenia in the past. She is supposed to get approval from her ophthalmologist before she will start methotrexate as she had recent eye surgery.  Drug Counseling  Alcohol use: Discussed  Patient was counseled on the purpose, proper use, and adverse effects of methotrexate  including nausea, infection, and signs and symptoms of pneumonitis.  Reviewed instructions with patient to take methotrexate weekly along with folic acid daily.  Discussed the importance of frequent monitoring of kidney and liver function and blood counts, and provided patient with standing lab instructions.  Counseled patient to avoid NSAIDs and alcohol while on methotrexate.  Provided patient with educational materials on methotrexate and answered all questions.  Advised patient to get annual influenza vaccine and to get a pneumococcal vaccine if patient has not already had one.  Patient voiced understanding.  Patient consented to methotrexate use.  Will upload into chart.    High risk medication use - (MTX 0.6 mL sq q wk to be started), folic acid 2 mg by mouth daily, prednisone 2.5-5 mg by mouth daily (off xeljanz due to colon rupture, has colostomy in place) - Plan: CBC with Differential/Platelet, COMPLETE METABOLIC PANEL WITH GFR in one month after starting methotrexate and then every 3 months.  Primary osteoarthritis of both hands: She is some chronic pain due to osteoarthritis.  Fibromyalgia - she experiences generalized pain from fibromyalgia.  DDD (degenerative disc disease), cervical: She continues to have some stiffness.  DDD (degenerative disc disease), lumbar chronic lower back pain persist.  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  History of coronary artery disease  History of neutropenia - W BC count is better partly because she is on prednisone.  History of hypertension  History of depression  Lapband APS April 2010    Orders: Orders Placed This Encounter  Procedures  . CBC with Differential/Platelet  . COMPLETE METABOLIC PANEL WITH GFR   No orders of the defined types were placed in this encounter.   Face-to-face time spent with patient was 30 minutes.Greater than 50% of time was spent in counseling and coordination of care.  Follow-Up  Instructions: Return in about 4 months (around 03/30/2018) for Rheumatoid arthritis, Osteoarthritis,FMS.   Pollyann Savoy, MD  Note - This record has been created using Animal nutritionist.  Chart creation errors have been sought, but may not always  have been located. Such creation errors do not reflect on  the standard of medical care.

## 2017-11-19 ENCOUNTER — Other Ambulatory Visit: Payer: Self-pay | Admitting: Rheumatology

## 2017-11-20 NOTE — Telephone Encounter (Signed)
Last Visit: 06/20/17 Next Visit: 11/29/17  Okay to refill per Dr. Corliss Skains

## 2017-11-29 ENCOUNTER — Encounter: Payer: Self-pay | Admitting: Rheumatology

## 2017-11-29 ENCOUNTER — Encounter: Payer: Self-pay | Admitting: *Deleted

## 2017-11-29 ENCOUNTER — Ambulatory Visit (INDEPENDENT_AMBULATORY_CARE_PROVIDER_SITE_OTHER): Payer: BLUE CROSS/BLUE SHIELD | Admitting: Rheumatology

## 2017-11-29 VITALS — BP 108/62 | HR 71 | Resp 16 | Ht 63.0 in | Wt 152.0 lb

## 2017-11-29 DIAGNOSIS — M797 Fibromyalgia: Secondary | ICD-10-CM | POA: Diagnosis not present

## 2017-11-29 DIAGNOSIS — Z79899 Other long term (current) drug therapy: Secondary | ICD-10-CM

## 2017-11-29 DIAGNOSIS — M0579 Rheumatoid arthritis with rheumatoid factor of multiple sites without organ or systems involvement: Secondary | ICD-10-CM

## 2017-11-29 DIAGNOSIS — Z9884 Bariatric surgery status: Secondary | ICD-10-CM

## 2017-11-29 DIAGNOSIS — M19042 Primary osteoarthritis, left hand: Secondary | ICD-10-CM | POA: Diagnosis not present

## 2017-11-29 DIAGNOSIS — Z933 Colostomy status: Secondary | ICD-10-CM

## 2017-11-29 DIAGNOSIS — M5030 Other cervical disc degeneration, unspecified cervical region: Secondary | ICD-10-CM

## 2017-11-29 DIAGNOSIS — M19041 Primary osteoarthritis, right hand: Secondary | ICD-10-CM

## 2017-11-29 DIAGNOSIS — Z8659 Personal history of other mental and behavioral disorders: Secondary | ICD-10-CM

## 2017-11-29 DIAGNOSIS — Z862 Personal history of diseases of the blood and blood-forming organs and certain disorders involving the immune mechanism: Secondary | ICD-10-CM

## 2017-11-29 DIAGNOSIS — Z8679 Personal history of other diseases of the circulatory system: Secondary | ICD-10-CM

## 2017-11-29 DIAGNOSIS — M5136 Other intervertebral disc degeneration, lumbar region: Secondary | ICD-10-CM

## 2017-11-29 NOTE — Patient Instructions (Addendum)
Standing Labs We placed an order today for your standing lab work.    Please come back and get your standing labs in 1 month after starting methotrexate and then every 3 months  We have open lab Monday through Friday from 8:30-11:30 AM and 1:30-4 PM at the office of Dr. Pollyann Savoy.   The office is located at [address], [place], [place] [address] No appointment is necessary.   Labs are drawn by [facility].  You may receive a bill from August for your lab work. If you have any questions regarding directions or hours of operation,  please call [phone].     Methotrexate tablets What is this medicine? METHOTREXATE (METH oh TREX ate) is a chemotherapy drug used to treat cancer including breast cancer, leukemia, and lymphoma. This medicine can also be used to treat psoriasis and certain kinds of arthritis. This medicine may be used for other purposes; ask your health care provider or pharmacist if you have questions. COMMON BRAND NAME(S): Rheumatrex, Trexall What should I tell my health care provider before I take this medicine? They need to know if you have any of these conditions: -fluid in the stomach area or lungs -if you often drink alcohol -infection or immune system problems -kidney disease or on hemodialysis -liver disease -low blood counts, like low white cell, platelet, or red cell counts -lung disease -radiation therapy -stomach ulcers -ulcerative colitis -an unusual or allergic reaction to methotrexate, other medicines, foods, dyes, or preservatives -pregnant or trying to get pregnant -breast-feeding How should I use this medicine? Take this medicine by mouth with a glass of water. Follow the directions on the prescription label. Take your medicine at regular intervals. Do not take it more often than directed. Do not stop taking except on your doctor's advice. Make sure you know why you are taking this medicine and how often you should take it. If this  medicine is used for a condition that is not cancer, like arthritis or psoriasis, it should be taken weekly, NOT daily. Taking this medicine more often than directed can cause serious side effects, even death. Talk to your healthcare provider about safe handling and disposal of this medicine. You may need to take special precautions. Talk to your pediatrician regarding the use of this medicine in children. While this drug may be prescribed for selected conditions, precautions do apply. Overdosage: If you think you have taken too much of this medicine contact a poison control center or emergency room at once. NOTE: This medicine is only for you. Do not share this medicine with others. What if I miss a dose? If you miss a dose, talk with your doctor or health care professional. Do not take double or extra doses. What may interact with this medicine? This medicine may interact with the following medication: -acitretin -aspirin and aspirin-like medicines including salicylates -azathioprine -certain antibiotics like penicillins, tetracycline, and chloramphenicol -cyclosporine -gold -hydroxychloroquine -live virus vaccines -NSAIDs, medicines for pain and inflammation, like ibuprofen or naproxen -other cytotoxic agents -penicillamine -phenylbutazone -phenytoin -probenecid -retinoids such as isotretinoin and tretinoin -steroid medicines like prednisone or cortisone -sulfonamides like sulfasalazine and trimethoprim/sulfamethoxazole -theophylline This list may not describe all possible interactions. Give your health care provider a list of all the medicines, herbs, non-prescription drugs, or dietary supplements you use. Also tell them if you smoke, drink alcohol, or use illegal drugs. Some items may interact with your medicine. What should I watch for while using this medicine? Avoid alcoholic drinks. This medicine  can make you more sensitive to the sun. Keep out of the sun. If you cannot avoid  being in the sun, wear protective clothing and use sunscreen. Do not use sun lamps or tanning beds/booths. You may need blood work done while you are taking this medicine. Call your doctor or health care professional for advice if you get a fever, chills or sore throat, or other symptoms of a cold or flu. Do not treat yourself. This drug decreases your body's ability to fight infections. Try to avoid being around people who are sick. This medicine may increase your risk to bruise or bleed. Call your doctor or health care professional if you notice any unusual bleeding. Check with your doctor or health care professional if you get an attack of severe diarrhea, nausea and vomiting, or if you sweat a lot. The loss of too much body fluid can make it dangerous for you to take this medicine. Talk to your doctor about your risk of cancer. You may be more at risk for certain types of cancers if you take this medicine. Both men and women must use effective birth control with this medicine. Do not become pregnant while taking this medicine or until at least 1 normal menstrual cycle has occurred after stopping it. Women should inform their doctor if they wish to become pregnant or think they might be pregnant. Men should not father a child while taking this medicine and for 3 months after stopping it. There is a potential for serious side effects to an unborn child. Talk to your health care professional or pharmacist for more information. Do not breast-feed an infant while taking this medicine. What side effects may I notice from receiving this medicine? Side effects that you should report to your doctor or health care professional as soon as possible: -allergic reactions like skin rash, itching or hives, swelling of the face, lips, or tongue -breathing problems or shortness of breath -diarrhea -dry, nonproductive cough -low blood counts - this medicine may decrease the number of white blood cells, red blood cells  and platelets. You may be at increased risk for infections and bleeding. -mouth sores -redness, blistering, peeling or loosening of the skin, including inside the mouth -signs of infection - fever or chills, cough, sore throat, pain or trouble passing urine -signs and symptoms of bleeding such as bloody or black, tarry stools; red or dark-brown urine; spitting up blood or brown material that looks like coffee grounds; red spots on the skin; unusual bruising or bleeding from the eye, gums, or nose -signs and symptoms of kidney injury like trouble passing urine or change in the amount of urine -signs and symptoms of liver injury like dark yellow or brown urine; general ill feeling or flu-like symptoms; light-colored stools; loss of appetite; nausea; right upper belly pain; unusually weak or tired; yellowing of the eyes or skin Side effects that usually do not require medical attention (report to your doctor or health care professional if they continue or are bothersome): -dizziness -hair loss -tiredness -upset stomach -vomiting This list may not describe all possible side effects. Call your doctor for medical advice about side effects. You may report side effects to FDA at 1-800-FDA-1088. Where should I keep my medicine? Keep out of the reach of children. Store at room temperature between 20 and 25 degrees C (68 and 77 degrees F). Protect from light. Throw away any unused medicine after the expiration date. NOTE: This sheet is a summary. It may not cover  all possible information. If you have questions about this medicine, talk to your doctor, pharmacist, or health care provider.  2018 Elsevier/Gold Standard (2015-08-16 05:39:22)

## 2017-12-24 ENCOUNTER — Other Ambulatory Visit: Payer: Self-pay | Admitting: Rheumatology

## 2017-12-24 NOTE — Telephone Encounter (Signed)
Last Visit: 11/29/17 Next Visit: 04/23/18 Labs: 11/09/17 stable  Okay to refill per Dr. Deveshwar 

## 2018-01-07 ENCOUNTER — Other Ambulatory Visit: Payer: Self-pay | Admitting: Rheumatology

## 2018-01-07 NOTE — Telephone Encounter (Signed)
Last Visit: 11/29/17 Next Visit: 04/23/18 Labs: 11/09/17 stable  Okay to refill per Dr. Corliss Skains

## 2018-01-21 ENCOUNTER — Ambulatory Visit: Payer: BLUE CROSS/BLUE SHIELD

## 2018-01-28 ENCOUNTER — Other Ambulatory Visit: Payer: Self-pay | Admitting: Rheumatology

## 2018-01-29 NOTE — Telephone Encounter (Signed)
Last visit: 11/29/2017 Next visit: 04/23/2018 Labs: 11/09/2017 stable  Okay to refill per Dr. Corliss Skains.

## 2018-02-20 ENCOUNTER — Other Ambulatory Visit: Payer: Self-pay | Admitting: Rheumatology

## 2018-02-21 ENCOUNTER — Other Ambulatory Visit: Payer: Self-pay | Admitting: Rheumatology

## 2018-02-21 NOTE — Telephone Encounter (Signed)
Last visit: 11/29/2017 Next visit: 04/23/2018  Okay to refill per Dr. Corliss Skains.

## 2018-02-22 NOTE — Telephone Encounter (Signed)
Last visit: 11/29/2017 Next visit: 04/23/2018 Labs: 11/09/17 Creat and BUN/creat ratio slightly elevated, GFR slightly low   Patient advised she is due for labs. She will update next week.  Okay to refill 30 day supply per Dr. Corliss Skains

## 2018-03-22 ENCOUNTER — Other Ambulatory Visit: Payer: Self-pay | Admitting: Rheumatology

## 2018-03-22 NOTE — Telephone Encounter (Signed)
Last visit: 11/29/2017 Next visit: 04/23/2018 Labs: 11/09/17 Creat and BUN/creat ratio slightly elevated, GFR slightly low

## 2018-03-25 ENCOUNTER — Telehealth: Payer: Self-pay | Admitting: Rheumatology

## 2018-03-25 ENCOUNTER — Other Ambulatory Visit: Payer: Self-pay | Admitting: Rheumatology

## 2018-03-25 DIAGNOSIS — Z79899 Other long term (current) drug therapy: Secondary | ICD-10-CM

## 2018-03-25 MED ORDER — METHOTREXATE SODIUM CHEMO INJECTION 50 MG/2ML
INTRAMUSCULAR | 0 refills | Status: DC
Start: 2018-03-25 — End: 2018-04-29

## 2018-03-25 NOTE — Telephone Encounter (Signed)
Patient request a refill on MTX sent to [facility]. Patient came in for lab work today.

## 2018-03-25 NOTE — Telephone Encounter (Signed)
Last visit: 11/29/2017 Next visit: 04/23/2018 Labs: 11/09/17 Creat and BUN/creat ratio slightly elevated, GFR slightly low   Patient updated labs 03/25/18.   Okay to refill 30 day supply per Dr. Corliss Skains

## 2018-03-25 NOTE — Telephone Encounter (Signed)
Attempted to contact the patient to advise she needs to update labs. Mailbox is full unable to leave a message.

## 2018-03-26 LAB — COMPLETE METABOLIC PANEL WITH GFR
AG Ratio: 1.6 (calc) (ref 1.0–2.5)
ALBUMIN MSPROF: 4 g/dL (ref 3.6–5.1)
ALKALINE PHOSPHATASE (APISO): 30 U/L — AB (ref 33–130)
ALT: 12 U/L (ref 6–29)
AST: 15 U/L (ref 10–35)
BILIRUBIN TOTAL: 0.6 mg/dL (ref 0.2–1.2)
BUN: 16 mg/dL (ref 7–25)
CHLORIDE: 103 mmol/L (ref 98–110)
CO2: 35 mmol/L — ABNORMAL HIGH (ref 20–32)
Calcium: 9.7 mg/dL (ref 8.6–10.4)
Creat: 0.89 mg/dL (ref 0.50–0.99)
GFR, Est African American: 77 mL/min/{1.73_m2} (ref >=60)
GFR, Est Non African American: 66 mL/min/{1.73_m2} (ref >=60)
GLOBULIN: 2.5 g/dL (ref 1.9–3.7)
GLUCOSE: 94 mg/dL (ref 65–99)
Potassium: 3.5 mmol/L (ref 3.5–5.3)
SODIUM: 142 mmol/L (ref 135–146)
Total Protein: 6.5 g/dL (ref 6.1–8.1)

## 2018-03-26 LAB — CBC WITH DIFFERENTIAL/PLATELET
BASOS PCT: 1.5 %
Basophils Absolute: 62 cells/uL (ref 0–200)
Eosinophils Absolute: 172 cells/uL (ref 15–500)
Eosinophils Relative: 4.2 %
HEMATOCRIT: 33.2 % — AB (ref 35.0–45.0)
HEMOGLOBIN: 11.5 g/dL — AB (ref 11.7–15.5)
LYMPHS ABS: 1283 {cells}/uL (ref 850–3900)
MCH: 33.7 pg — ABNORMAL HIGH (ref 27.0–33.0)
MCHC: 34.6 g/dL (ref 32.0–36.0)
MCV: 97.4 fL (ref 80.0–100.0)
MPV: 10.8 fL (ref 7.5–12.5)
Monocytes Relative: 7.6 %
NEUTROS ABS: 2271 {cells}/uL (ref 1500–7800)
Neutrophils Relative %: 55.4 %
Platelets: 218 10*3/uL (ref 140–400)
RBC: 3.41 10*6/uL — AB (ref 3.80–5.10)
RDW: 14.1 % (ref 11.0–15.0)
Total Lymphocyte: 31.3 %
WBC: 4.1 10*3/uL (ref 3.8–10.8)
WBCMIX: 312 {cells}/uL (ref 200–950)

## 2018-03-26 NOTE — Progress Notes (Signed)
wnl

## 2018-04-10 ENCOUNTER — Telehealth: Payer: Self-pay | Admitting: Rheumatology

## 2018-04-10 NOTE — Progress Notes (Signed)
Office Visit Note  Patient: Vanessa Collins             Date of Birth: 12/24/1946           MRN: [id number]             PCP: Clovis Riley, L.August Saucer, MD Referring: Clovis Riley, Elbert Ewings.August Saucer, MD Visit Date: 04/23/2018 Occupation: @GUAROCC @    Subjective:  Medication Management   History of Present Illness: Vanessa Collins is a 72 y.o. female history of rheumatoid arthritis, osteoarthritis, DDD and fibromyalgia syndrome.  She states she had a flare of rheumatoid arthritis about 1-1/2 weeks ago with pain and swelling in her right ankle joint to the point she was having difficulty walking.  She was given a prednisone taper which improved her symptoms.  She has been taking methotrexate 0.6 mL/week and she has been off 78 since she had intestinal rupture.  Activities of Daily Living:  Patient reports morning stiffness for 3 hours.   Patient Denies nocturnal pain.  Difficulty dressing/grooming: Denies Difficulty climbing stairs: Denies Difficulty getting out of chair: Denies Difficulty using hands for taps, buttons, cutlery, and/or writing: Denies   Review of Systems  Constitutional: Positive for fatigue. Negative for night sweats, weight gain and weight loss.  HENT: Negative for mouth sores, trouble swallowing, trouble swallowing, mouth dryness and nose dryness.   Eyes: Negative for pain, redness, visual disturbance and dryness.  Respiratory: Negative for cough, shortness of breath and difficulty breathing.   Cardiovascular: Positive for hypertension. Negative for chest pain, palpitations, irregular heartbeat and swelling in legs/feet.  Gastrointestinal: Negative for blood in stool, constipation and diarrhea.       Colostomy bag  Endocrine: Negative for increased urination.  Genitourinary: Negative for vaginal dryness.  Musculoskeletal: Positive for arthralgias, joint pain and morning stiffness. Negative for joint swelling, myalgias, muscle weakness, muscle tenderness and myalgias.  Skin: Negative for  color change, rash, hair loss, skin tightness, ulcers and sensitivity to sunlight.  Allergic/Immunologic: Negative for susceptible to infections.  Neurological: Negative for dizziness, memory loss, night sweats and weakness.  Hematological: Negative for swollen glands.  Psychiatric/Behavioral: Negative for depressed mood and sleep disturbance. The patient is not nervous/anxious.     PMFS History:  Patient Active Problem List   Diagnosis Date Noted  . History of depression 06/19/2017  . Rheumatoid arthritis involving multiple sites with positive rheumatoid factor (HCC) 01/25/2017  . High risk medication use 01/25/2017  . Immunosuppression (HCC) 01/25/2017  . Fibromyalgia 01/25/2017  . Primary osteoarthritis of both hands 01/25/2017  . Spondylosis of lumbar region without myelopathy or radiculopathy 01/25/2017  . DJD (degenerative joint disease), cervical 01/25/2017  . History of neutropenia 01/25/2017  . History of coronary artery disease 01/25/2017  . History of hypertension 01/25/2017  . 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 Wears dentures     Past Medical History:  Diagnosis Date  . Arthritis   . Heart disease   . Hyperlipidemia   . Wears dentures    gum disease    Family History  Problem Relation Age of Onset  . Macular degeneration Mother   . Cancer Father   . Rheum arthritis Father   . Bipolar disorder Father   . Bipolar disorder Daughter   . Rheum arthritis Daughter    Past Surgical History:  Procedure Laterality Date  . colostomy placement  09/2017  . CORONARY STENT PLACEMENT  08/2009  . EYE SURGERY    . LAPAROSCOPIC GASTRIC BANDING  04/05/2009  . ruptured  colon  09/2017   Social History   Social History Narrative  . Not on file     Objective: Vital Signs: BP 111/73 (BP Location: Left Arm, Patient Position: Sitting, Cuff Size: Normal)   Pulse 64   Resp 13   Ht 5\' 3"  (1.6 m)   Wt 155 lb 8 oz (70.5 kg)   BMI 27.55 kg/m    Physical Exam    Constitutional: She is oriented to person, place, and time. She appears well-developed and well-nourished.  HENT:  Head: Normocephalic and atraumatic.  Eyes: Conjunctivae and EOM are normal.  Neck: Normal range of motion.  Cardiovascular: Normal rate, regular rhythm, normal heart sounds and intact distal pulses.  Pulmonary/Chest: Effort normal and breath sounds normal.  Abdominal: Soft. Bowel sounds are normal.  Colostomy bag  Lymphadenopathy:    She has no cervical adenopathy.  Neurological: She is alert and oriented to person, place, and time.  Skin: Skin is warm and dry. Capillary refill takes less than 2 seconds.  Psychiatric: She has a normal mood and affect. Her behavior is normal.  Nursing note and vitals reviewed.    Musculoskeletal Exam: C-spine thoracic lumbar spine limited range of motion with some discomfort.  Shoulder joints elbow joints wrist joint MCPs PIPs DIPs were in good range of motion with no synovitis.  She has some thickening of PIP DIP joints.  Hip joints knee joints ankles MTPs PIPs were in good range of motion.  She has no synovitis or ankle joints today.  CDAI Exam: CDAI Homunculus Exam:   Joint Counts:  CDAI Tender Joint count: 0 CDAI Swollen Joint count: 0  Global Assessments:  Patient Global Assessment: 7 Provider Global Assessment: 5  CDAI Calculated Score: 12    Investigation: No additional findings. CBC Latest Ref Rng & Units 03/25/2018 11/09/2017 06/20/2017  WBC 3.8 - 10.8 Thousand/uL 4.1 7.8 5.2  Hemoglobin 11.7 - 15.5 g/dL 11.5(L) 11.1(L) 12.3  Hematocrit 35.0 - 45.0 % 33.2(L) 32.6(L) 36.9  Platelets 140 - 400 Thousand/uL 218 247 211   CMP Latest Ref Rng & Units 03/25/2018 11/09/2017 06/20/2017  Glucose 65 - 99 mg/dL 94 06/22/2017) 96  BUN 7 - 25 mg/dL 16 23 16   Creatinine 0.50 - 0.99 mg/dL 606(T ) 0.16  Sodium 135 - 146 mmol/L 142 141 140  Potassium 3.5 - 5.3 mmol/L 3.5 4.4 4.1  Chloride 98 - 110 mmol/L 103 101 100  CO2 20 - 32 mmol/L  35(H) 33(H) 32(H)  Calcium 8.6 - 10.4 mg/dL 9.7 9.8 9.6  Total Protein 6.1 - 8.1 g/dL 6.5 6.6 6.6  Total Bilirubin 0.2 - 1.2 mg/dL 0.6 0.4 0.5  Alkaline Phos 33 - 130 U/L - - 33  AST 10 - 35 U/L 15 13 23   ALT 6 - 29 U/L 12 9 26     Imaging: No results found.  Speciality Comments: No specialty comments available.    Procedures:  No procedures performed Allergies: Aspirin   Assessment / Plan:     Visit Diagnoses: Rheumatoid arthritis involving multiple sites with positive rheumatoid factor (HCC) - Positive RF, positive anti-CCP. 0.10(X was discontinued after colon rupture.  Patient reports a recent flare about 1-1/2-week ago which required prednisone taper.  The swelling was in her left ankle joint with a flare.  She had no synovitis on examination today.  I discussed the option of adding Plaquenil to methotrexate.  At this point she would like to hold off.  High risk medication use - (MTX 0.6 mL  sq q wk, folic acid 2 mg by mouth daily, prednisone 2.5-5 mg by mouth daily (off xeljanz due to colon rupture, has colostomy in place.  Her labs have been stable.  We will plan continue to monitor labs every 3 months.  Primary osteoarthritis of both hands-joint protection muscle strengthening was discussed.  Fibromyalgia-she continues to have some generalized pain and discomfort.  DDD (degenerative disc disease), cervical-she has some stiffness with range of motion.  DDD (degenerative disc disease), lumbar-doing well.  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  She will have surgery for reanastomosis in 2 months.  I have advised her to discontinue methotrexate a week prior to the surgery and restart 1 week later if there is no signs of infection after approved by surgeon.  Vitamin D deficiency - Plan: VITAMIN D 25 Hydroxy (Vit-D Deficiency, Fractures).  She is on supplement.  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 VITAMIN D 25 Hydroxy (Vit-D Deficiency, Fractures)  . Lipid panel  . Hepatic function panel   No orders of the defined types were placed in this encounter.   Face-to-face time spent with patient was 30 minutes.> 50% of time was spent in counseling and coordination of care.  Follow-Up Instructions: Return in about 5 months (around 09/23/2018) for Rheumatoid arthritis, Osteoarthritis,FMS.   Pollyann Savoy, MD  Note - This record has been created using Animal nutritionist.  Chart creation errors have been sought, but may not always  have been located. Such creation errors do not reflect on  the standard of medical care.

## 2018-04-10 NOTE — Telephone Encounter (Signed)
Patient called stating that she is having a lot of pain in her right foot and is requesting a prescription of Prednisone be called into [facility] in [place].

## 2018-04-10 NOTE — Telephone Encounter (Signed)
Patient states she is having pain and inflammation in her right foot. Patient state she is having trouble walking. Patient is requesting a prednisone taper. Please advise.

## 2018-04-11 MED ORDER — PREDNISONE 5 MG PO TABS: ORAL_TABLET | ORAL | 0 refills | Status: DC

## 2018-04-11 NOTE — Telephone Encounter (Signed)
Ok to send Pred taper 20 mg and taper by 5 mg q 4d

## 2018-04-19 ENCOUNTER — Other Ambulatory Visit: Payer: Self-pay | Admitting: Rheumatology

## 2018-04-23 ENCOUNTER — Encounter: Payer: Self-pay | Admitting: Rheumatology

## 2018-04-23 ENCOUNTER — Ambulatory Visit (INDEPENDENT_AMBULATORY_CARE_PROVIDER_SITE_OTHER): Payer: BLUE CROSS/BLUE SHIELD | Admitting: Rheumatology

## 2018-04-23 VITALS — BP 111/73 | HR 64 | Resp 13 | Ht 63.0 in | Wt 155.5 lb

## 2018-04-23 DIAGNOSIS — M0579 Rheumatoid arthritis with rheumatoid factor of multiple sites without organ or systems involvement: Secondary | ICD-10-CM

## 2018-04-23 DIAGNOSIS — Z933 Colostomy status: Secondary | ICD-10-CM

## 2018-04-23 DIAGNOSIS — Z8679 Personal history of other diseases of the circulatory system: Secondary | ICD-10-CM | POA: Diagnosis not present

## 2018-04-23 DIAGNOSIS — E559 Vitamin D deficiency, unspecified: Secondary | ICD-10-CM | POA: Diagnosis not present

## 2018-04-23 DIAGNOSIS — Z862 Personal history of diseases of the blood and blood-forming organs and certain disorders involving the immune mechanism: Secondary | ICD-10-CM

## 2018-04-23 DIAGNOSIS — Z8659 Personal history of other mental and behavioral disorders: Secondary | ICD-10-CM

## 2018-04-23 DIAGNOSIS — M19041 Primary osteoarthritis, right hand: Secondary | ICD-10-CM

## 2018-04-23 DIAGNOSIS — Z9884 Bariatric surgery status: Secondary | ICD-10-CM

## 2018-04-23 DIAGNOSIS — M19042 Primary osteoarthritis, left hand: Secondary | ICD-10-CM

## 2018-04-23 DIAGNOSIS — M5136 Other intervertebral disc degeneration, lumbar region: Secondary | ICD-10-CM

## 2018-04-23 DIAGNOSIS — M5030 Other cervical disc degeneration, unspecified cervical region: Secondary | ICD-10-CM

## 2018-04-23 DIAGNOSIS — Z79899 Other long term (current) drug therapy: Secondary | ICD-10-CM | POA: Diagnosis not present

## 2018-04-23 DIAGNOSIS — M797 Fibromyalgia: Secondary | ICD-10-CM

## 2018-04-23 DIAGNOSIS — M51369 Other intervertebral disc degeneration, lumbar region without mention of lumbar back pain or lower extremity pain: Secondary | ICD-10-CM

## 2018-04-23 DIAGNOSIS — I10 Essential (primary) hypertension: Secondary | ICD-10-CM

## 2018-04-23 LAB — HEPATIC FUNCTION PANEL
AG Ratio: 1.8 (calc) (ref 1.0–2.5)
ALKALINE PHOSPHATASE (APISO): 34 U/L (ref 33–130)
ALT: 15 U/L (ref 6–29)
AST: 14 U/L (ref 10–35)
Albumin: 4.1 g/dL (ref 3.6–5.1)
BILIRUBIN INDIRECT: 0.6 mg/dL (ref 0.2–1.2)
Bilirubin, Direct: 0.1 mg/dL (ref 0.0–0.2)
GLOBULIN: 2.3 g/dL (ref 1.9–3.7)
TOTAL PROTEIN: 6.4 g/dL (ref 6.1–8.1)
Total Bilirubin: 0.7 mg/dL (ref 0.2–1.2)

## 2018-04-23 LAB — LIPID PANEL
Cholesterol: 177 mg/dL (ref <200)
HDL: 87 mg/dL (ref >50)
LDL Cholesterol (Calc): 69 mg/dL (calc)
NON-HDL CHOLESTEROL (CALC): 90 mg/dL (ref <130)
Total CHOL/HDL Ratio: 2 (calc) (ref <5.0)
Triglycerides: 128 mg/dL (ref <150)

## 2018-04-23 NOTE — Telephone Encounter (Signed)
Last visit: 11/29/2017 Next visit: 04/23/2018 Labs:03/25/18 WNL  Okay to refill per Dr. Corliss Skains

## 2018-04-23 NOTE — Patient Instructions (Signed)
Standing Labs We placed an order today for your standing lab work.    Please come back and get your standing labs in July and every 3 months   We have open lab Monday through Friday from 8:30-11:30 AM and 1:30-4:00 PM  at the office of Dr. Chapin Arduini.   You may experience shorter wait times on Monday and Friday afternoons. The office is located at [address], [address], [place], [place] [address] No appointment is necessary.   Labs are drawn by [facility].  You may receive a bill from [facility] for your lab work. If you have any questions regarding directions or hours of operation,  please call [phone].    

## 2018-04-24 LAB — VITAMIN D 25 HYDROXY (VIT D DEFICIENCY, FRACTURES): Vit D, 25-Hydroxy: 49 ng/mL (ref 30–100)

## 2018-04-29 ENCOUNTER — Other Ambulatory Visit: Payer: Self-pay | Admitting: Rheumatology

## 2018-04-29 NOTE — Telephone Encounter (Signed)
Last Visit: 04/23/18 Next Visit: 09/12/18 Labs: 03/25/18 WNL  Okay to refill per Dr. Deveshwar  

## 2018-05-03 ENCOUNTER — Other Ambulatory Visit: Payer: Self-pay | Admitting: Rheumatology

## 2018-05-06 ENCOUNTER — Telehealth: Payer: Self-pay | Admitting: Rheumatology

## 2018-05-06 NOTE — Telephone Encounter (Signed)
Attempted to contact the patient and advised if she need prednisone taper refill to contact the office, to advise what may be going on.

## 2018-05-06 NOTE — Telephone Encounter (Signed)
Patient state she did not need a refill on her prednisone taper.

## 2018-05-06 NOTE — Telephone Encounter (Signed)
Patient left a voicemail stating she was returning your call.   

## 2018-06-07 ENCOUNTER — Other Ambulatory Visit: Payer: Self-pay | Admitting: Rheumatology

## 2018-06-10 NOTE — Telephone Encounter (Signed)
Last Visit: 04/23/18 Next Visit: 09/12/18  Okay to refill per Dr. Corliss Skains

## 2018-07-01 ENCOUNTER — Telehealth: Payer: Self-pay | Admitting: Rheumatology

## 2018-07-01 MED ORDER — PREDNISONE 5 MG PO TABS: ORAL_TABLET | ORAL | 0 refills | Status: DC

## 2018-07-01 NOTE — Telephone Encounter (Signed)
Patient called requesting prescription refill of Prednisone to be sent to [facility].  Patient states she fell on her back porch yesterday and is now experiencing pain all over.

## 2018-07-01 NOTE — Telephone Encounter (Signed)
Please offer patient an appointment for evaluation.  She may require a x-ray due to her recent fall.  If the patient is having a rheumatoid arthritis flare, it is ok to send in a prednisone taper starting at 20 mg by mouth daily and tapering by 5 mg every 4 days.

## 2018-07-01 NOTE — Telephone Encounter (Signed)
Called patient and offered patient an appointment and patient declined. Patient states she was seen by her PCP and does not need any xrays. Patient states the rheumatoid arthritis is flaring in her hands and she is in need of a prednisone taper. Prescription has been sent to the pharmacy and patient has been notified.

## 2018-07-08 ENCOUNTER — Other Ambulatory Visit: Payer: Self-pay | Admitting: Rheumatology

## 2018-07-08 NOTE — Telephone Encounter (Signed)
Last Visit: 04/23/18 Next Visit: 09/12/18 Labs: 03/25/18 WNL  Okay to refill per Dr. Corliss Skains

## 2018-07-15 ENCOUNTER — Encounter: Payer: Self-pay | Admitting: Surgery

## 2018-07-15 ENCOUNTER — Ambulatory Visit: Payer: Self-pay | Admitting: Surgery

## 2018-07-15 NOTE — H&P (Signed)
Vanessa Collins Documented: 07/15/2018 3:12 PM Location: [facility] Patient #: [id number] DOB: [birth year] Married / Language: [place] / Race: White Female  History of Present Illness Ardeth Sportsman MD; 07/15/2018 4:07 PM) The patient is a 72 year old female who presents with diverticulitis. Note for "Diverticulitis": ` ` ` Patient sent for surgical consultation at the request of Milus Height, [place]  Chief Complaint: History emergency colectomy/colostomy Hartmann resection. 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 Harriette Ohara. Developed colonic perforation & required emergency Hartmann resection in [place] while visiting her mother in October 2018. Lives down here. Wishes to consider colostomy takedown. Discussed with primary care office. Surgical consultation requested. She is tapered down to just 2.5 mg prednisone a day. Stationary bike about an hour a day. Usually empties her colostomy bag about twice a day. No history of prior irritable bowel her irregular bowels. She does not smoke. She had C-section and hysterectomy but no other abdominal surgeries before her emergent Hartmann. She's never had a colonoscopy. She is followed by Dr. Sharyn Lull for HTN issues. No major cardiac disease and she is aware of  No personal nor family history of GI/colon cancer, inflammatory bowel disease, irritable bowel syndrome, allergy such as Celiac Sprue, dietary/dairy problems, colitis, ulcers nor gastritis. No recent sick contacts/gastroenteritis. No travel outside the country. No changes in diet. No dysphagia to solids or liquids. No significant heartburn or reflux. No hematochezia, hematemesis, coffee ground emesis. No evidence of prior gastric/peptic ulceration. She did have adjustable gastric banding done laparoscopically by Dr Daphine Deutscher in our group about 10 years ago. It looks like it's been removed  since then since there is no evidence on the CAT scan. She has not been in our office for that for 5 years.  (Review of systems as stated in this history (HPI) or in the review of systems. Otherwise all other 12 point ROS are negative) ` ` `   Problem List/Past Medical Ardeth Sportsman, MD; 07/15/2018 4:00 PM) RHEUMATOID ARTHRITIS INVOLVING MULTIPLE SITES WITH POSITIVE RHEUMATOID FACTOR (M05.79) FIBROMYALGIA (M79.7)  Past Surgical History Ardeth Sportsman, MD; 07/15/2018 3:18 PM) CREATION, HARTMANN POUCH, WITH COLOSTOMY OR ILEOSTOMY [phone]) [10/08/2017]Arnette Schaumann, [place] for perforated colon  Allergies Maurilio Lovely; 07/15/2018 3:12 PM) No Known Drug Allergies [07/15/2018]: Allergies Reconciled  Medication History Maurilio Lovely; 07/15/2018 3:15 PM) Metoprolol Succinate ER (50MG  Tablet ER 24HR, Oral) Active. Simvastatin (80MG  Tablet, Oral) Active. ALPRAZolam (0.5MG  Tablet, Oral) Active. PredniSONE (5MG  Tablet, Oral) Active. Premarin (0.3MG  Tablet, Oral) Active. Sertraline HCl (50MG  Tablet, Oral) Active. Methotrexate Sodium (50MG /2ML Solution, Injection) Active. Celecoxib (200MG  Capsule, Oral) Active. Benicar HCT (40-25MG  Tablet, Oral) Active. Medications Reconciled Aspirin (81MG  Tablet DR, Oral) Active.    Vitals Maurilio Lovely; 07/15/2018 3:14 PM) 07/15/2018 3:13 PM Weight: 159.25 lb Height: 63in Body Surface Area: 1.76 m Body Mass Index: 28.21 kg/m  Temp.: 98.36F(Oral)  Pulse: 67 (Regular)  BP: 132/72 (Sitting, Left Arm, Standard)      Physical Exam Ardeth Sportsman MD; 07/15/2018 4:07 PM)  General Mental Status-Alert. General Appearance-Not in acute distress, Not Sickly. Orientation-Oriented X3. Hydration-Well hydrated. Voice-Normal.  Integumentary Global Assessment Upon inspection and palpation of skin surfaces of the - Axillae: non-tender, no inflammation or ulceration, no drainage. and Distribution of scalp and body hair  is normal. General Characteristics Temperature - normal warmth is noted.  Head and Neck Head-normocephalic, atraumatic with no lesions or palpable masses. Face Global Assessment - atraumatic,  no absence of expression. Neck Global Assessment - no abnormal movements, no bruit auscultated on the right, no bruit auscultated on the left, no decreased range of motion, non-tender. Trachea-midline. Thyroid Gland Characteristics - non-tender.  Eye Eyeball - Left-Extraocular movements intact, No Nystagmus. Eyeball - Right-Extraocular movements intact, No Nystagmus. Cornea - Left-No Hazy. Cornea - Right-No Hazy. Sclera/Conjunctiva - Left-No scleral icterus, No Discharge. Sclera/Conjunctiva - Right-No scleral icterus, No Discharge. Pupil - Left-Direct reaction to light normal. Pupil - Right-Direct reaction to light normal.  ENMT Ears Pinna - Left - no drainage observed, no generalized tenderness observed. Right - no drainage observed, no generalized tenderness observed. Nose and Sinuses External Inspection of the Nose - no destructive lesion observed. Inspection of the nares - Left - quiet respiration. Right - quiet respiration. Mouth and Throat Lips - Upper Lip - no fissures observed, no pallor noted. Lower Lip - no fissures observed, no pallor noted. Nasopharynx - no discharge present. Oral Cavity/Oropharynx - Tongue - no dryness observed. Oral Mucosa - no cyanosis observed. Hypopharynx - no evidence of airway distress observed.  Chest and Lung Exam Inspection Movements - Normal and Symmetrical. Accessory muscles - No use of accessory muscles in breathing. Palpation Palpation of the chest reveals - Non-tender. Auscultation Breath sounds - Normal and Clear.  Cardiovascular Auscultation Rhythm - Regular. Murmurs & Other Heart Sounds - Auscultation of the heart reveals - No Murmurs and No Systolic Clicks.  Abdomen Inspection Inspection of the abdomen reveals - No  Visible peristalsis and No Abnormal pulsations. Umbilicus - No Bleeding, No Urine drainage. Palpation/Percussion Palpation and Percussion of the abdomen reveal - Soft, Non Tender, No Rebound tenderness, No Rigidity (guarding) and No Cutaneous hyperesthesia. Note: Colostomy left infraumbilical. Pink without any obvious hernia. Low midline incision closed without hernia Abdomen soft. Not severely distended. No distasis recti. No umbilical or other anterior abdominal wall hernias  Female Genitourinary Sexual Maturity Tanner 5 - Adult hair pattern. Note: No vaginal bleeding nor discharge  Peripheral Vascular Upper Extremity Inspection - Left - No Cyanotic nailbeds, Not Ischemic. Right - No Cyanotic nailbeds, Not Ischemic.  Neurologic Neurologic evaluation reveals -normal attention span and ability to concentrate, able to name objects and repeat phrases. Appropriate fund of knowledge , normal sensation and normal coordination. Mental Status Affect - not angry, not paranoid. Cranial Nerves-Normal Bilaterally. Gait-Normal.  Neuropsychiatric Mental status exam performed with findings of-able to articulate well with normal speech/language, rate, volume and coherence, thought content normal with ability to perform basic computations and apply abstract reasoning and no evidence of hallucinations, delusions, obsessions or homicidal/suicidal ideation.  Musculoskeletal Global Assessment Spine, Ribs and Pelvis - no instability, subluxation or laxity. Right Upper Extremity - no instability, subluxation or laxity.  Lymphatic Head & Neck  General Head & Neck Lymphatics: Bilateral - Description - No Localized lymphadenopathy. Axillary  General Axillary Region: Bilateral - Description - No Localized lymphadenopathy. Femoral & Inguinal  Generalized Femoral & Inguinal Lymphatics: Left - Description - No Localized lymphadenopathy. Right - Description - No Localized  lymphadenopathy.    Assessment & Plan Elease Hashimoto Spillers CMA; 07/15/2018 3:53 PM)  PREOP COLON - ENCOUNTER FOR PREOPERATIVE EXAMINATION FOR GENERAL SURGICAL PROCEDURE (Z01.818) Impression: Resume consider colostomy takedown.  Colonoscopy beforehand.  See below  Current Plans You are being scheduled for surgery- Our schedulers will call you.  You should hear from our office's scheduling department within 5 working days about the location, date, and time of surgery. We try to make accommodations for patient's preferences  in scheduling surgery, but sometimes the OR schedule or the surgeon's schedule prevents [place] from making those accommodations.  If you have not heard from our office [phone]) in 5 working days, call the office and ask for your surgeon's nurse.  If you have other questions about your diagnosis, plan, or surgery, call the office and ask for your surgeon's nurse.  Written instructions provided Instructed to schedule colonoscopywith a gastroenterologist The anatomy & physiology of the digestive tract was discussed. The pathophysiology of the colon was discussed. Natural history risks without surgery was discussed. I feel the risks of no intervention will lead to serious problems that outweigh the operative risks; therefore, I recommended a partial colectomy to remove the pathology. Minimally invasive (Robotic/Laparoscopic) & open techniques were discussed.  Risks such as bleeding, infection, abscess, leak, reoperation, possible ostomy, hernia, heart attack, death, and other risks were discussed. I noted a good likelihood this will help address the problem. Goals of post-operative recovery were discussed as well. Need for adequate nutrition, daily bowel regimen and healthy physical activity, to optimize recovery was noted as well. We will work to minimize complications. Educational materials were available as well. Questions were answered. The patient expresses  understanding & wishes to proceed with surgery.  Pt Education - [facility] Colon Bowel Prep 2018 ERAS/Miralax/Antibiotics Started Neomycin Sulfate 500 MG Oral Tablet, 2 (two) Tablet SEE NOTE, #6, 07/15/2018, No Refill. Local Order: TAKE TWO TABLETS AT 2 PM, 3 PM, AND 10 PM THE DAY PRIOR TO SURGERY Started Flagyl 500 MG Oral Tablet, 2 (two) Tablet SEE NOTE, #6, 07/15/2018, No Refill. Local Order: Take at 2pm, 3pm, and 10pm the day prior to your colon operation Pt Education - Pamphlet Given - Laparoscopic Colorectal Surgery: discussed with patient and provided information. Pt Education - [facility] Colectomy post-op instructions: discussed with patient and provided information.  COLOSTOMY IN PLACE (Z93.3)  Current Plans Pt Education - [facility] Ostomy HCI (Willett Lefeber): discussed with patient and provided information.  PERFORATION OF SIGMOID COLON DUE TO DIVERTICULITIS (K57.20) Impression: Recovered status post emergent open Hartmann resection for perforated colon. Pathology consistent with diverticulitis. That was in October 2018.  It is reasonable to proceed with colostomy takedown. Her immunosuppression has been minimize to just 2.5 mg of prednisone a day.  Is graded exercise tolerance but sees her cardiologist, Dr. her Saralyn Pilar, regularly. Would benefit from cardiac clearance. Most likely just a quick note.  I think it would be a good idea to get colonoscopy beforehand. Perhaps could be coordinated day before surgery so she can avoid multiple bowel preps. She is interested in that.  Proceed with minimally invasive colostomy takedown. Robotically-assisted versus laparoscopically.  She would like to be able to go to her brother's birthday in [place] in early October. Therefore, most likely should try and get this done 4 weeks before or more likely after if there is not enough time.  Current Plans Pt Education - [facility] Diverticular Disease (AT)  Ardeth Sportsman, MD, FACS, MASCRS Gastrointestinal and Minimally  Invasive Surgery    [address], [address] [place], [place] [address] [phone] Main / Paging [phone] Fax

## 2018-07-24 ENCOUNTER — Other Ambulatory Visit: Payer: Self-pay | Admitting: Cardiology

## 2018-07-24 DIAGNOSIS — R079 Chest pain, unspecified: Secondary | ICD-10-CM

## 2018-08-05 ENCOUNTER — Ambulatory Visit (HOSPITAL_COMMUNITY)
Admission: RE | Admit: 2018-08-05 | Discharge: 2018-08-05 | Disposition: A | Discharge status: Home / Self Care | Payer: BLUE CROSS/BLUE SHIELD | Source: Ambulatory Visit | Attending: Cardiology | Admitting: Cardiology

## 2018-08-05 DIAGNOSIS — R079 Chest pain, unspecified: Secondary | ICD-10-CM | POA: Diagnosis not present

## 2018-08-05 MED ORDER — REGADENOSON 0.4 MG/5ML IV SOLN
INTRAVENOUS | Status: AC
  Filled 2018-08-05: qty 5

## 2018-08-05 MED ORDER — TECHNETIUM TC 99M TETROFOSMIN IV KIT
30.0000 | PACK | Freq: Once | INTRAVENOUS | Status: AC | PRN
  Administered 2018-08-05: 30 via INTRAVENOUS

## 2018-08-05 MED ORDER — REGADENOSON 0.4 MG/5ML IV SOLN
0.4000 mg | Freq: Once | INTRAVENOUS | Status: AC
  Administered 2018-08-05: 0.4 mg via INTRAVENOUS

## 2018-08-05 MED ORDER — TECHNETIUM TC 99M TETROFOSMIN IV KIT
10.0000 | PACK | Freq: Once | INTRAVENOUS | Status: AC | PRN
  Administered 2018-08-05: 10 via INTRAVENOUS

## 2018-08-16 ENCOUNTER — Telehealth: Payer: Self-pay | Admitting: Rheumatology

## 2018-08-16 MED ORDER — METHOTREXATE SODIUM CHEMO INJECTION 50 MG/2ML: INTRAMUSCULAR | 0 refills | Status: DC

## 2018-08-16 NOTE — Telephone Encounter (Signed)
Last Visit: 04/23/18 Next Visit: 09/12/18 Labs: 03/25/18 WNL  Patient advised she is due for labs and will update next week.   Okay to refill 30 days upply per Dr. Corliss Skains

## 2018-08-16 NOTE — Telephone Encounter (Signed)
Felicia from [facility] pharmacy called requesting refill of Methotrexate.  Patient is out of medication.  Please call us back for refill #[phone]

## 2018-08-29 ENCOUNTER — Other Ambulatory Visit: Payer: Self-pay | Admitting: Rheumatology

## 2018-08-29 NOTE — Progress Notes (Signed)
Office Visit Note  Patient: Vanessa Collins             Date of Birth: [birth year]           MRN: [id number]             PCP: Clovis Riley, L.August Saucer, MD Referring: Clovis Riley, Elbert Ewings.August Saucer, MD Visit Date: 09/12/2018 Occupation: @GUAROCC @  Subjective:  Pain in multiple joints.   History of Present Illness: Vanessa Collins is a 72 y.o. female with history of seropositive rheumatoid arthritis osteoarthritis and fibromyalgia.  She states she has been having increased pain and discomfort since she has been off 66.  She continues to have pain and discomfort in her both hands, bilateral knees and ankles.  She also has a lot of neck is stiffness.  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  She has not had reanastomosis of the intestine yet.  Activities of Daily Living:  Patient reports morning stiffness for 4 hours.   Patient Reports nocturnal pain.  Difficulty dressing/grooming: Reports Difficulty climbing stairs: Denies Difficulty getting out of chair: Reports Difficulty using hands for taps, buttons, cutlery, and/or writing: Reports  Review of Systems  Constitutional: Positive for fever. Negative for fatigue, night sweats, weight gain and weight loss.  HENT: Negative for mouth sores, trouble swallowing, trouble swallowing, mouth dryness and nose dryness.   Eyes: Negative for pain, redness, visual disturbance and dryness.  Respiratory: Negative for cough, shortness of breath and difficulty breathing.   Cardiovascular: Negative for chest pain, palpitations, hypertension, irregular heartbeat and swelling in legs/feet.  Gastrointestinal: Negative for blood in stool, constipation and diarrhea.  Endocrine: Negative for increased urination.  Genitourinary: Negative for difficulty urinating and vaginal dryness.  Musculoskeletal: Positive for arthralgias, joint pain, joint swelling, myalgias, muscle weakness, morning stiffness and myalgias. Negative for muscle tenderness.  Skin: Negative for  color change, rash, hair loss, skin tightness, ulcers and sensitivity to sunlight.  Allergic/Immunologic: Negative for susceptible to infections.  Neurological: Positive for numbness and weakness. Negative for dizziness, headaches, memory loss and night sweats.  Hematological: Negative for swollen glands.  Psychiatric/Behavioral: Positive for depressed mood and sleep disturbance. The patient is not nervous/anxious.     PMFS History:  Patient Active Problem List   Diagnosis Date Noted  . History of depression 06/19/2017  . Rheumatoid arthritis involving multiple sites with positive rheumatoid factor (HCC) 01/25/2017  . High risk medication use 01/25/2017  . Immunosuppression (HCC) 01/25/2017  . Fibromyalgia 01/25/2017  . Primary osteoarthritis of both hands 01/25/2017  . Spondylosis of lumbar region without myelopathy or radiculopathy 01/25/2017  . DJD (degenerative joint disease), cervical 01/25/2017  . History of neutropenia 01/25/2017  . History of coronary artery disease 01/25/2017  . History of hypertension 01/25/2017  . 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 Wears dentures     Past Medical History:  Diagnosis Date  . Arthritis   . Heart disease   . Hyperlipidemia   . Wears dentures    gum disease    Family History  Problem Relation Age of Onset  . Macular degeneration Mother   . Cancer Father   . Rheum arthritis Father   . Bipolar disorder Father   . Bipolar disorder Daughter   . Rheum arthritis Daughter    Past Surgical History:  Procedure Laterality Date  . CESAREAN SECTION    . COLECTOMY WITH COLOSTOMY CREATION/HARTMANN PROCEDURE  09/2017   Perforated diverticulitis  . CORONARY STENT PLACEMENT  08/2009  . EYE SURGERY    .  LAPAROSCOPIC GASTRIC BANDING  04/05/2009   Dr Daphine Deutscher  . VAGINAL HYSTERECTOMY     Social History   Social History Narrative  . Not on file    Objective: Vital Signs: BP 92/65 (BP Location: Left Arm, Patient Position: Sitting, Cuff Size:  Normal)   Pulse 68   Ht 5\' 3"  (1.6 m)   Wt 158 lb 6.4 oz (71.8 kg)   BMI 28.06 kg/m    Physical Exam  Constitutional: She is oriented to person, place, and time. She appears well-developed and well-nourished.  HENT:  Head: Normocephalic and atraumatic.  Eyes: Conjunctivae and EOM are normal.  Neck: Normal range of motion.  Cardiovascular: Normal rate, regular rhythm, normal heart sounds and intact distal pulses.  Pulmonary/Chest: Effort normal and breath sounds normal.  Abdominal: Soft. Bowel sounds are normal.  Lymphadenopathy:    She has no cervical adenopathy.  Neurological: She is alert and oriented to person, place, and time.  Skin: Skin is warm and dry. Capillary refill takes less than 2 seconds.  Psychiatric: She has a normal mood and affect. Her behavior is normal.  Nursing note and vitals reviewed.    Musculoskeletal Exam: She has limited range of motion of cervical and lumbar spine.  Shoulder joints elbow joints wrist joints MCPs PIPs DIPs were in good range of motion.  No synovitis was noted.  She has good range of motion of her hip joints and knee joints.  She has some swelling over bilateral ankle joints.  CDAI Exam: CDAI Score: 1.5  Patient Global Assessment: 9 (mm); Provider Global Assessment: 6 (mm) Swollen: 0 ; Tender: 0  Joint Exam   Not documented   There is currently no information documented on the homunculus. Go to the Rheumatology activity and complete the homunculus joint exam.  Investigation: No additional findings.  Imaging: No results found.  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 03/25/17 was discontinued after colon rupture.  Patient has been having increased pain and discomfort in her ankle joints.  Her methotrexate dose was decreased because of elevation in creatinine.  If we have to add another medication we can add Imuran 50 mg p.o. daily if T PMT is normal.  She will need lab work in 2 weeks after adding Imuran.  Medication counseling:  TPMT: Pending  Patient was counseled on the purpose, proper use, and adverse effects of azathioprine including risk of infection, nausea, rash, and hair loss.  Reviewed risk of cancer after long term use.  Discussed risk of myelosupression and reviewed importance of frequent lab work to monitor blood counts.  Reviewed drug-drug interactions including contraindication with allopurinol.  Provided patient with educational materials on azathioprine and answered all questions.  Patient consented to azathioprine.  Will upload consent into the media tab.    High risk medication use - (MTX 0.6 mL sq q wk, folic acid 2 mg by mouth daily, prednisone 2.5-5 mg by mouth daily (off xeljanz due to colon rupture, has colostomy in place.   - Plan: CBC with Differential/Platelet, COMPLETE METABOLIC PANEL WITH GFR, Thiopurine methyltransferase(tpmt)rbc  Primary osteoarthritis of both hands-she has been having pain and discomfort in her bilateral hands but no synovitis was noted.  Fibromyalgia-she is having a flare of fibromyalgia with increased pain and discomfort.  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  She will have surgery for reanastomosis in 2 months.  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 CBC with Differential/Platelet  . COMPLETE METABOLIC PANEL WITH GFR  . Thiopurine methyltransferase(tpmt)rbc   No orders of the defined types were placed in this encounter.   Face-to-face time spent with patient was 30 minutes. Greater than 50% of time was spent in counseling and coordination of care.  Follow-Up Instructions: Return in about 3 months (around 12/12/2018) for Rheumatoid arthritis, Osteoarthritis.   Pollyann Savoy, MD  Note - This record has been created using Animal nutritionist.  Chart creation errors have been sought, but may not always  have been located. Such creation errors do not reflect on  the standard of medical care.

## 2018-08-30 NOTE — Telephone Encounter (Signed)
Last Visit: 04/23/18 Next Visit: 09/12/18  Okay to refill per Dr. Deveshwar 

## 2018-09-12 ENCOUNTER — Encounter: Payer: Self-pay | Admitting: Rheumatology

## 2018-09-12 ENCOUNTER — Ambulatory Visit (INDEPENDENT_AMBULATORY_CARE_PROVIDER_SITE_OTHER): Payer: BLUE CROSS/BLUE SHIELD | Admitting: Rheumatology

## 2018-09-12 VITALS — BP 92/65 | HR 68 | Ht 63.0 in | Wt 158.4 lb

## 2018-09-12 DIAGNOSIS — E559 Vitamin D deficiency, unspecified: Secondary | ICD-10-CM

## 2018-09-12 DIAGNOSIS — Z8659 Personal history of other mental and behavioral disorders: Secondary | ICD-10-CM

## 2018-09-12 DIAGNOSIS — M19041 Primary osteoarthritis, right hand: Secondary | ICD-10-CM

## 2018-09-12 DIAGNOSIS — Z862 Personal history of diseases of the blood and blood-forming organs and certain disorders involving the immune mechanism: Secondary | ICD-10-CM

## 2018-09-12 DIAGNOSIS — M5136 Other intervertebral disc degeneration, lumbar region: Secondary | ICD-10-CM

## 2018-09-12 DIAGNOSIS — M51369 Other intervertebral disc degeneration, lumbar region without mention of lumbar back pain or lower extremity pain: Secondary | ICD-10-CM

## 2018-09-12 DIAGNOSIS — Z9884 Bariatric surgery status: Secondary | ICD-10-CM

## 2018-09-12 DIAGNOSIS — M5030 Other cervical disc degeneration, unspecified cervical region: Secondary | ICD-10-CM

## 2018-09-12 DIAGNOSIS — M19042 Primary osteoarthritis, left hand: Secondary | ICD-10-CM

## 2018-09-12 DIAGNOSIS — I10 Essential (primary) hypertension: Secondary | ICD-10-CM

## 2018-09-12 DIAGNOSIS — Z79899 Other long term (current) drug therapy: Secondary | ICD-10-CM | POA: Diagnosis not present

## 2018-09-12 DIAGNOSIS — Z8679 Personal history of other diseases of the circulatory system: Secondary | ICD-10-CM

## 2018-09-12 DIAGNOSIS — M797 Fibromyalgia: Secondary | ICD-10-CM

## 2018-09-12 DIAGNOSIS — M0579 Rheumatoid arthritis with rheumatoid factor of multiple sites without organ or systems involvement: Secondary | ICD-10-CM

## 2018-09-12 DIAGNOSIS — Z933 Colostomy status: Secondary | ICD-10-CM

## 2018-09-12 NOTE — Patient Instructions (Signed)
Azathioprine tablets What is this medicine? AZATHIOPRINE (ay za THYE oh preen) suppresses the immune system. It is used to prevent organ rejection after a transplant. It is also used to treat rheumatoid arthritis. This medicine may be used for other purposes; ask your health care provider or pharmacist if you have questions. COMMON BRAND NAME(S): Azasan, Imuran What should I tell my health care provider before I take this medicine? They need to know if you have any of these conditions: -infection -kidney disease -liver disease -an unusual or allergic reaction to azathioprine, other medicines, lactose, foods, dyes, or preservatives -pregnant or trying to get pregnant -breast feeding How should I use this medicine? Take this medicine by mouth with a full glass of water. Follow the directions on the prescription label. Take your medicine at regular intervals. Do not take your medicine more often than directed. Continue to take your medicine even if you feel better. Do not stop taking except on your doctor's advice. Talk to your pediatrician regarding the use of this medicine in children. Special care may be needed. Overdosage: If you think you have taken too much of this medicine contact a poison control center or emergency room at once. NOTE: This medicine is only for you. Do not share this medicine with others. What if I miss a dose? If you miss a dose, take it as soon as you can. If it is almost time for your next dose, take only that dose. Do not take double or extra doses. What may interact with this medicine? Do not take this medicine with any of the following medications: -febuxostat -mercaptopurine This medicine may also interact with the following medications: -allopurinol -aminosalicylates like sulfasalazine, mesalamine, balsalazide, and olsalazine -leflunomide -medicines called ACE inhibitors like benazepril, captopril, enalapril, fosinopril, quinapril, lisinopril, ramipril, and  trandolapril -mycophenolate -sulfamethoxazole; trimethoprim -vaccines -warfarin This list may not describe all possible interactions. Give your health care provider a list of all the medicines, herbs, non-prescription drugs, or dietary supplements you use. Also tell them if you smoke, drink alcohol, or use illegal drugs. Some items may interact with your medicine. What should I watch for while using this medicine? Visit your doctor or health care professional for regular checks on your progress. You will need frequent blood checks during the first few months you are receiving the medicine. If you get a cold or other infection while receiving this medicine, call your doctor or health care professional. Do not treat yourself. The medicine may increase your risk of getting an infection. Women should inform their doctor if they wish to become pregnant or think they might be pregnant. There is a potential for serious side effects to an unborn child. Talk to your health care professional or pharmacist for more information. Men may have a reduced sperm count while they are taking this medicine. Talk to your health care professional for more information. This medicine may increase your risk of getting certain kinds of cancer. Talk to your doctor about healthy lifestyle choices, important screenings, and your risk. What side effects may I notice from receiving this medicine? Side effects that you should report to your doctor or health care professional as soon as possible: -allergic reactions like skin rash, itching or hives, swelling of the face, lips, or tongue -changes in vision -confusion -fever, chills, or any other sign of infection -loss of balance or coordination -severe stomach pain -unusual bleeding, bruising -unusually weak or tired -vomiting -yellowing of the eyes or skin Side effects that usually do   not require medical attention (report to your doctor or health care professional if they  continue or are bothersome): -hair loss -nausea This list may not describe all possible side effects. Call your doctor for medical advice about side effects. You may report side effects to FDA at 1-800-FDA-1088. Where should I keep my medicine? Keep out of the reach of children. Store at room temperature between 15 and 25 degrees C (59 and 77 degrees F). Protect from light. Throw away any unused medicine after the expiration date. NOTE: This sheet is a summary. It may not cover all possible information. If you have questions about this medicine, talk to your doctor, pharmacist, or health care provider.  2018 Elsevier/Gold Standard (2014-04-07 12:00:31)  

## 2018-09-13 ENCOUNTER — Telehealth: Payer: Self-pay | Admitting: *Deleted

## 2018-09-13 MED ORDER — METHOTREXATE SODIUM CHEMO INJECTION 50 MG/2ML: INTRAMUSCULAR | 0 refills | Status: DC

## 2018-09-13 NOTE — Telephone Encounter (Signed)
-----   Message from Pollyann Savoy, MD sent at 09/13/2018  2:19 PM EDT ----- Her potassium is low.  She should contact her PCP.  Please forward lab results to her PCP.

## 2018-09-13 NOTE — Progress Notes (Signed)
Her potassium is low.  She should contact her PCP.  Please forward lab results to her PCP.

## 2018-09-13 NOTE — Telephone Encounter (Signed)
Patient states she needs a refill on MTX.   Last Visit: 09/12/18 Next Visit: 12/04/18 Labs: 09/12/18 decreased potassium   Okay to refill per Dr. Corliss Skains

## 2018-09-14 ENCOUNTER — Other Ambulatory Visit: Payer: Self-pay | Admitting: Rheumatology

## 2018-09-16 NOTE — Telephone Encounter (Signed)
Last Visit: 09/12/18 Next Visit: 12/04/18  Okay to refill per Dr. Corliss Skains

## 2018-09-17 ENCOUNTER — Telehealth: Payer: Self-pay | Admitting: Rheumatology

## 2018-09-17 LAB — CBC WITH DIFFERENTIAL/PLATELET
BASOS ABS: 83 {cells}/uL (ref 0–200)
Basophils Relative: 1 %
EOS PCT: 1.7 %
Eosinophils Absolute: 141 cells/uL (ref 15–500)
HEMATOCRIT: 37.3 % (ref 35.0–45.0)
Hemoglobin: 12.8 g/dL (ref 11.7–15.5)
LYMPHS ABS: 996 {cells}/uL (ref 850–3900)
MCH: 33.4 pg — ABNORMAL HIGH (ref 27.0–33.0)
MCHC: 34.3 g/dL (ref 32.0–36.0)
MCV: 97.4 fL (ref 80.0–100.0)
MPV: 11.2 fL (ref 7.5–12.5)
Monocytes Relative: 5.3 %
Neutro Abs: 6640 cells/uL (ref 1500–7800)
Neutrophils Relative %: 80 %
Platelets: 244 10*3/uL (ref 140–400)
RBC: 3.83 10*6/uL (ref 3.80–5.10)
RDW: 13.2 % (ref 11.0–15.0)
TOTAL LYMPHOCYTE: 12 %
WBC mixed population: 440 cells/uL (ref 200–950)
WBC: 8.3 10*3/uL (ref 3.8–10.8)

## 2018-09-17 LAB — COMPLETE METABOLIC PANEL WITH GFR
AG RATIO: 2 (calc) (ref 1.0–2.5)
ALT: 21 U/L (ref 6–29)
AST: 25 U/L (ref 10–35)
Albumin: 4.5 g/dL (ref 3.6–5.1)
Alkaline phosphatase (APISO): 39 U/L (ref 33–130)
BUN / CREAT RATIO: 22 (calc) (ref 6–22)
BUN: 21 mg/dL (ref 7–25)
CO2: 35 mmol/L — AB (ref 20–32)
CREATININE: 0.96 mg/dL — AB (ref 0.60–0.93)
Calcium: 10.2 mg/dL (ref 8.6–10.4)
Chloride: 100 mmol/L (ref 98–110)
GFR, Est African American: 69 mL/min/{1.73_m2} (ref >=60)
GFR, Est Non African American: 60 mL/min/{1.73_m2} (ref >=60)
GLOBULIN: 2.2 g/dL (ref 1.9–3.7)
Glucose, Bld: 86 mg/dL (ref 65–99)
Potassium: 3.3 mmol/L — ABNORMAL LOW (ref 3.5–5.3)
Sodium: 142 mmol/L (ref 135–146)
Total Bilirubin: 0.7 mg/dL (ref 0.2–1.2)
Total Protein: 6.7 g/dL (ref 6.1–8.1)

## 2018-09-17 LAB — THIOPURINE METHYLTRANSFERASE (TPMT), RBC: THIOPURINE METHYLTRANSFERASE, RBC: 15 nmol/h/mL

## 2018-09-17 NOTE — Telephone Encounter (Signed)
Patient called stating she had an appointment with Dr. Corliss Skains on 09/12/18 and was told she would increase her dosage of Methotrexate.  Patient states her prescription sent to the pharmacy is the same as she always takes.  Patient states Dr. Corliss Skains was also going to call in "something else to help with her RA."   Patient is requesting a return call to discuss the medications.

## 2018-09-17 NOTE — Telephone Encounter (Signed)
If she is willing to restart on Imuran, please a schedule an appointment with [employer].  Patient will have to discontinue methotrexate and start on Imuran 50 mg p.o. daily we will check labs in 2 weeks and then increase it 200 mg p.o. daily.  After that the labs will be checked in 2 weeks and then every 2 months.

## 2018-09-17 NOTE — Telephone Encounter (Signed)
Patient advised that the MTX was not due to be increased due to her increase in her creatinine. Patient advised that Imuran was discussed with her at her last appointment and we were awaiting the results of the TPMT before sending to the pharmacy. TPMT is normal. Okay to send Imuran to the pharmacy?

## 2018-09-18 NOTE — Telephone Encounter (Signed)
Called patient to schedule appointment to discuss Imuran.  Left voicemail.

## 2018-09-24 NOTE — Telephone Encounter (Signed)
Called patient again today to schedule appointment to discuss Imuran.  Left voicemail.

## 2018-09-30 ENCOUNTER — Ambulatory Visit: Payer: Self-pay

## 2018-09-30 NOTE — Telephone Encounter (Signed)
Patient called the office on Friday.  She was very frustrated and stated that she has given up and does not want to try a new medication.  Scheduled an appointment for 10/7 to discuss medication history and medication options.  She no showed for her appointment today.  Discussed with Dr. Corliss Skains and will not proceed further.

## 2018-10-02 ENCOUNTER — Ambulatory Visit (INDEPENDENT_AMBULATORY_CARE_PROVIDER_SITE_OTHER): Payer: BLUE CROSS/BLUE SHIELD | Admitting: Pharmacist

## 2018-10-02 DIAGNOSIS — Z79899 Other long term (current) drug therapy: Secondary | ICD-10-CM | POA: Diagnosis not present

## 2018-10-02 DIAGNOSIS — M0579 Rheumatoid arthritis with rheumatoid factor of multiple sites without organ or systems involvement: Secondary | ICD-10-CM | POA: Diagnosis not present

## 2018-10-02 MED ORDER — METHOTREXATE SODIUM CHEMO INJECTION 50 MG/2ML: INTRAMUSCULAR | 0 refills | Status: DC

## 2018-10-02 NOTE — Progress Notes (Signed)
Pharmacy Counseling Note  Patient presents to [facility] orthopedics to discuss medication options requested by Dr. Corliss Skains.  She has a history of seropositive rheumatoid arthritis, osteoarthritis, and fibromyalgia.  Her current medication regimen includes methotrexate 15 mg once weekly which was decreased due to increase in serum creatinine.    Previous medications include Enbrel and Humira in which she had an inadequate response.  Also tried Orencia which caused headaches and nausea.  Patient previously on Xeljanz with good response but had adverse event of GI perforation.  At last office visit Dr. Corliss Skains discussed replacing methotrexate with Imuran.  The concern is methotrexate cannot be increased any further due to kidney function and patient still does not have adequate relief.  She is very frustrated with her current condition and medication options.  Reassured patient we understand her frustration and we want to find a medication regimen that offers her adequate relief without causing serious adverse events.  Patient was understanding.  Other medications that have been discussed in the past but patient declined include Plaquenil and Remicade infusion.  Provided education materials for Imuran, Plaquenil, Remicade for patient to review and research.   Patient states she is to have ostomy surgery in November and would not like to change her medications at this time.  Patient states she is due for refill on methotrexate.  Okay to fill per Dr. Corliss Skains at current dose.  Instructed patient to call and set up office visit with Dr. Earlene Plater for when she is ready to discuss other medication options post surgery.  Verlin Fester, PharmD, [facility] Clinical Pharmacist  10/02/2018 4:55 PM         .

## 2018-10-02 NOTE — Patient Instructions (Signed)
Follow up after surgery about medication options.  Schedule office visit with Dr. Corliss Skains.  Hydroxychloroquine tablets What is this medicine? HYDROXYCHLOROQUINE (hye drox ee KLOR oh kwin) is used to treat rheumatoid arthritis and systemic lupus erythematosus. It is also used to treat malaria. This medicine may be used for other purposes; ask your health care provider or pharmacist if you have questions. COMMON BRAND NAME(S): Plaquenil, Quineprox What should I tell my health care provider before I take this medicine? They need to know if you have any of these conditions: -diabetes -eye disease, vision problems -G6PD deficiency -history of blood diseases -history of irregular heartbeat -if you often drink alcohol -kidney disease -liver disease -porphyria -psoriasis -seizures -an unusual or allergic reaction to chloroquine, hydroxychloroquine, other medicines, foods, dyes, or preservatives -pregnant or trying to get pregnant -breast-feeding How should I use this medicine? Take this medicine by mouth with a glass of water. Follow the directions on the prescription label. Avoid taking antacids within 4 hours of taking this medicine. It is best to separate these medicines by at least 4 hours. Do not cut, crush or chew this medicine. You can take it with or without food. If it upsets your stomach, take it with food. Take your medicine at regular intervals. Do not take your medicine more often than directed. Take all of your medicine as directed even if you think you are better. Do not skip doses or stop your medicine early. Talk to your pediatrician regarding the use of this medicine in children. While this drug may be prescribed for selected conditions, precautions do apply. Overdosage: If you think you have taken too much of this medicine contact a poison control center or emergency room at once. NOTE: This medicine is only for you. Do not share this medicine with others. What if I miss a  dose? If you miss a dose, take it as soon as you can. If it is almost time for your next dose, take only that dose. Do not take double or extra doses. What may interact with this medicine? Do not take this medicine with any of the following medications: -cisapride -dofetilide -dronedarone -live virus vaccines -penicillamine -pimozide -thioridazine -ziprasidone This medicine may also interact with the following medications: -ampicillin -antacids -cimetidine -cyclosporine -digoxin -medicines for diabetes, like insulin, glipizide, glyburide -medicines for seizures like carbamazepine, phenobarbital, phenytoin -mefloquine -methotrexate -other medicines that prolong the QT interval (cause an abnormal heart rhythm) -praziquantel This list may not describe all possible interactions. Give your health care provider a list of all the medicines, herbs, non-prescription drugs, or dietary supplements you use. Also tell them if you smoke, drink alcohol, or use illegal drugs. Some items may interact with your medicine. What should I watch for while using this medicine? Tell your doctor or healthcare professional if your symptoms do not start to get better or if they get worse. Avoid taking antacids within 4 hours of taking this medicine. It is best to separate these medicines by at least 4 hours. Tell your doctor or health care professional right away if you have any change in your eyesight. Your vision and blood may be tested before and during use of this medicine. This medicine can make you more sensitive to the sun. Keep out of the sun. If you cannot avoid being in the sun, wear protective clothing and use sunscreen. Do not use sun lamps or tanning beds/booths. What side effects may I notice from receiving this medicine? Side effects that you should report to  your doctor or health care professional as soon as possible: -allergic reactions like skin rash, itching or hives, swelling of the face,  lips, or tongue -changes in vision -decreased hearing or ringing of the ears -redness, blistering, peeling or loosening of the skin, including inside the mouth -seizures -sensitivity to light -signs and symptoms of a dangerous change in heartbeat or heart rhythm like chest pain; dizziness; fast or irregular heartbeat; palpitations; feeling faint or lightheaded, falls; breathing problems -signs and symptoms of liver injury like dark yellow or brown urine; general ill feeling or flu-like symptoms; light-colored stools; loss of appetite; nausea; right upper belly pain; unusually weak or tired; yellowing of the eyes or skin -signs and symptoms of low blood sugar such as feeling anxious; confusion; dizziness; increased hunger; unusually weak or tired; sweating; shakiness; cold; irritable; headache; blurred vision; fast heartbeat; loss of consciousness -uncontrollable head, mouth, neck, arm, or leg movements Side effects that usually do not require medical attention (report to your doctor or health care professional if they continue or are bothersome): -anxious -diarrhea -dizziness -hair loss -headache -irritable -loss of appetite -nausea, vomiting -stomach pain This list may not describe all possible side effects. Call your doctor for medical advice about side effects. You may report side effects to FDA at 1-800-FDA-1088. Where should I keep my medicine? Keep out of the reach of children. In children, this medicine can cause overdose with small doses. Store at room temperature between 15 and 30 degrees C (59 and 86 degrees F). Protect from moisture and light. Throw away any unused medicine after the expiration date. NOTE: This sheet is a summary. It may not cover all possible information. If you have questions about this medicine, talk to your doctor, pharmacist, or health care provider.  2018 Elsevier/Gold Standard (2016-07-26 14:16:15) Azathioprine tablets What is this  medicine? AZATHIOPRINE (ay za THYE oh preen) suppresses the immune system. It is used to prevent organ rejection after a transplant. It is also used to treat rheumatoid arthritis. This medicine may be used for other purposes; ask your health care provider or pharmacist if you have questions. COMMON BRAND NAME(S): Azasan, Imuran What should I tell my health care provider before I take this medicine? They need to know if you have any of these conditions: -infection -kidney disease -liver disease -an unusual or allergic reaction to azathioprine, other medicines, lactose, foods, dyes, or preservatives -pregnant or trying to get pregnant -breast feeding How should I use this medicine? Take this medicine by mouth with a full glass of water. Follow the directions on the prescription label. Take your medicine at regular intervals. Do not take your medicine more often than directed. Continue to take your medicine even if you feel better. Do not stop taking except on your doctor's advice. Talk to your pediatrician regarding the use of this medicine in children. Special care may be needed. Overdosage: If you think you have taken too much of this medicine contact a poison control center or emergency room at once. NOTE: This medicine is only for you. Do not share this medicine with others. What if I miss a dose? If you miss a dose, take it as soon as you can. If it is almost time for your next dose, take only that dose. Do not take double or extra doses. What may interact with this medicine? Do not take this medicine with any of the following medications: -febuxostat -mercaptopurine This medicine may also interact with the following medications: -allopurinol -aminosalicylates like sulfasalazine, mesalamine,  balsalazide, and olsalazine -leflunomide -medicines called ACE inhibitors like benazepril, captopril, enalapril, fosinopril, quinapril, lisinopril, ramipril, and  trandolapril -mycophenolate -sulfamethoxazole; trimethoprim -vaccines -warfarin This list may not describe all possible interactions. Give your health care provider a list of all the medicines, herbs, non-prescription drugs, or dietary supplements you use. Also tell them if you smoke, drink alcohol, or use illegal drugs. Some items may interact with your medicine. What should I watch for while using this medicine? Visit your doctor or health care professional for regular checks on your progress. You will need frequent blood checks during the first few months you are receiving the medicine. If you get a cold or other infection while receiving this medicine, call your doctor or health care professional. Do not treat yourself. The medicine may increase your risk of getting an infection. Women should inform their doctor if they wish to become pregnant or think they might be pregnant. There is a potential for serious side effects to an unborn child. Talk to your health care professional or pharmacist for more information. Men may have a reduced sperm count while they are taking this medicine. Talk to your health care professional for more information. This medicine may increase your risk of getting certain kinds of cancer. Talk to your doctor about healthy lifestyle choices, important screenings, and your risk. What side effects may I notice from receiving this medicine? Side effects that you should report to your doctor or health care professional as soon as possible: -allergic reactions like skin rash, itching or hives, swelling of the face, lips, or tongue -changes in vision -confusion -fever, chills, or any other sign of infection -loss of balance or coordination -severe stomach pain -unusual bleeding, bruising -unusually weak or tired -vomiting -yellowing of the eyes or skin Side effects that usually do not require medical attention (report to your doctor or health care professional if they  continue or are bothersome): -hair loss -nausea This list may not describe all possible side effects. Call your doctor for medical advice about side effects. You may report side effects to FDA at 1-800-FDA-1088. Where should I keep my medicine? Keep out of the reach of children. Store at room temperature between 15 and 25 degrees C (59 and 77 degrees F). Protect from light. Throw away any unused medicine after the expiration date. NOTE: This sheet is a summary. It may not cover all possible information. If you have questions about this medicine, talk to your doctor, pharmacist, or health care provider.  2018 Elsevier/Gold Standard (2014-04-07 12:00:31) Infliximab injection What is this medicine? INFLIXIMAB (in FLIX i mab) is used to treat Crohn's disease and ulcerative colitis. It is also used to treat ankylosing spondylitis, plaque psoriasis, and some forms of arthritis. This medicine may be used for other purposes; ask your health care provider or pharmacist if you have questions. COMMON BRAND NAME(S): INFLECTRA, Remicade, RENFLEXIS What should I tell my health care provider before I take this medicine? They need to know if you have any of these conditions: -cancer -current or past resident of [place] -diabetes -exposure to tuberculosis -Guillain-Barre syndrome -heart failure -hepatitis or liver disease -immune system problems -infection -lung or breathing disease, like COPD -multiple sclerosis -receiving phototherapy for the skin -seizure disorder -an unusual or allergic reaction to infliximab, mouse proteins, other medicines, foods, dyes, or preservatives -pregnant or trying to get pregnant -breast-feeding How should I use this medicine? This medicine is for injection into a vein. It is usually given by  a health care professional in a hospital or clinic setting. A special MedGuide will be given to you by the pharmacist with each prescription and  refill. Be sure to read this information carefully each time. Talk to your pediatrician regarding the use of this medicine in children. While this drug may be prescribed for children as young as 38 years of age for selected conditions, precautions do apply. Overdosage: If you think you have taken too much of this medicine contact a poison control center or emergency room at once. NOTE: This medicine is only for you. Do not share this medicine with others. What if I miss a dose? It is important not to miss your dose. Call your doctor or health care professional if you are unable to keep an appointment. What may interact with this medicine? Do not take this medicine with any of the following medications: -biologic medicines such as abatacept, adalimumab, anakinra, certolizumab, etanercept, golimumab, rituximab, secukinumab, tocilizumab, tofactinib, ustekinumab -live vaccines This list may not describe all possible interactions. Give your health care provider a list of all the medicines, herbs, non-prescription drugs, or dietary supplements you use. Also tell them if you smoke, drink alcohol, or use illegal drugs. Some items may interact with your medicine. What should I watch for while using this medicine? Your condition will be monitored carefully while you are receiving this medicine. Visit your doctor or health care professional for regular checks on your progress. You may need blood work done while you are taking this medicine. Before beginning therapy, your doctor may do a test to see if you have been exposed to tuberculosis. Call your doctor or health care professional for advice if you get a fever, chills or sore throat, or other symptoms of a cold or flu. Do not treat yourself. This drug decreases your body's ability to fight infections. Try to avoid being around people who are sick. This medicine may make the symptoms of heart failure worse in some patients. If you notice symptoms such as  increased shortness of breath or swelling of the ankles or legs, contact your health care provider right away. If you are going to have surgery or dental work, tell your health care professional or dentist that you have received this medicine. If you take this medicine for plaque psoriasis, stay out of the sun. If you cannot avoid being in the sun, wear protective clothing and use sunscreen. Do not use sun lamps or tanning beds/booths. Talk to your doctor about your risk of cancer. You may be more at risk for certain types of cancers if you take this medicine. What side effects may I notice from receiving this medicine? Side effects that you should report to your doctor or health care professional as soon as possible: -allergic reactions like skin rash, itching or hives, swelling of the face, lips, or tongue -breathing problems -changes in vision -chest pain -fever or chills, usually related to the infusion -joint pain -pain, tingling, numbness in the hands or feet -redness, blistering, peeling or loosening of the skin, including inside the mouth -seizures -signs of infection - fever or chills, cough, sore throat, flu-like symptoms, pain or difficulty passing urine -signs and symptoms of liver injury like dark yellow or brown urine; general ill feeling; light-colored stools; loss of appetite; nausea; right upper belly pain; unusually weak or tired; yellowing of the eyes or skin -signs and symptoms of a stroke like changes in vision; confusion; trouble speaking or understanding; severe headaches; sudden numbness or weakness  of the face, arm or leg; trouble walking; dizziness; loss of balance or coordination -swelling of the ankles, feet, or hands -swollen lymph nodes in the neck, underarm, or groin areas -unusual bleeding or bruising -unusually weak or tired Side effects that usually do not require medical attention (report to your doctor or health care professional if they continue or are  bothersome): -headache -nausea -stomach pain -upset stomach This list may not describe all possible side effects. Call your doctor for medical advice about side effects. You may report side effects to FDA at 1-800-FDA-1088. Where should I keep my medicine? This drug is given in a hospital or clinic and will not be stored at home. NOTE: This sheet is a summary. It may not cover all possible information. If you have questions about this medicine, talk to your doctor, pharmacist, or health care provider.  2018 Elsevier/Gold Standard (2017-01-10 13:45:32)

## 2018-10-02 NOTE — Addendum Note (Signed)
Addended by: Verlin Fester C on: 10/02/2018 05:05 PM   Modules accepted: Orders

## 2018-10-03 NOTE — Patient Instructions (Addendum)
Vanessa Collins  10/03/2018   Your procedure is scheduled on: 10-09-18   Report to [facility] Main  Entrance    Report to admitting at 6:30AM    Call this number if you have problems the morning of surgery [phone]     Remember: PLEASE FOLLOW ALL BOWEL PREP INSTRUCTIONS PROVIDED BY YOUR SURGEON!  DRINK 2 PRESURGERY ENSURE DRINKS THE NIGHT BEFORE SURGERY AT 10:00 PM NOTHING BY MOUTH EXCEPT CLEAR LIQUIDS UNTIL THREE HOURS PRIOR TO SCHEDULED SURGERY  DRINK 1 PRESURGERY DRINK THE DAY OF THE PROCEDURE 3 HOURS PRIOR TO SCHEDULED SURGERY AT ___5:30AM___.  BRUSH YOUR TEETH MORNING OF SURGERY AND RINSE YOUR MOUTH OUT, NO CHEWING GUM CANDY OR MINTS.      CLEAR LIQUID DIET   Foods Allowed                                                                     Foods Excluded  Coffee and tea, regular and decaf                             liquids that you cannot  Plain Jell-O in any flavor                                             see through such as: Fruit ices (not with fruit pulp)                                     milk, soups, orange juice  Iced Popsicles                                    All solid food Carbonated beverages, regular and diet                                    Cranberry, grape and apple juices Sports drinks like Gatorade Lightly seasoned clear broth or consume(fat free) Sugar, honey syrup  Sample Menu Breakfast                                Lunch                                     Supper Cranberry juice                    Beef broth                            Chicken broth Jell-O  Grape juice                           Apple juice Coffee or tea                        Jell-O                                      Popsicle                                                Coffee or tea                        Coffee or tea  _____________________________________________________________________       Take  these medicines the morning of surgery with A SIP OF WATER: ZYRTEC, PEPCID, METOPROLOL, SERTRALINE, SIMVASTATIN                                 You may not have any metal on your body including hair pins and              piercings  Do not wear jewelry, make-up, lotions, powders or perfumes, deodorant             Do not wear nail polish.  Do not shave  48 hours prior to surgery.        Do not bring valuables to the hospital. [facility] IS NOT             RESPONSIBLE   FOR VALUABLES.  Contacts, dentures or bridgework may not be worn into surgery.  Leave suitcase in the car. After surgery it may be brought to your room.                 Please read over the following fact sheets you were given: _____________________________________________________________________             [facility] - Preparing for Surgery Before surgery, you can play an important role.  Because skin is not sterile, your skin needs to be as free of germs as possible.  You can reduce the number of germs on your skin by washing with CHG (chlorahexidine gluconate) soap before surgery.  CHG is an antiseptic cleaner which kills germs and bonds with the skin to continue killing germs even after washing. Please DO NOT use if you have an allergy to CHG or antibacterial soaps.  If your skin becomes reddened/irritated stop using the CHG and inform your nurse when you arrive at Short Stay. Do not shave (including legs and underarms) for at least 48 hours prior to the first CHG shower.  You may shave your face/neck. Please follow these instructions carefully:  1.  Shower with CHG Soap the night before surgery and the  morning of Surgery.  2.  If you choose to wash your hair, wash your hair first as usual with your  normal  shampoo.  3.  After you shampoo, rinse your hair and body thoroughly to remove the  shampoo.  4.  Use CHG as you would any other liquid soap.  You can apply chg directly  to the skin and  wash                       Gently with a scrungie or clean washcloth.  5.  Apply the CHG Soap to your body ONLY FROM THE NECK DOWN.   Do not use on face/ open                           Wound or open sores. Avoid contact with eyes, ears mouth and genitals (private parts).                       Wash face,  Genitals (private parts) with your normal soap.             6.  Wash thoroughly, paying special attention to the area where your surgery  will be performed.  7.  Thoroughly rinse your body with warm water from the neck down.  8.  DO NOT shower/wash with your normal soap after using and rinsing off  the CHG Soap.                9.  Pat yourself dry with a clean towel.            10.  Wear clean pajamas.            11.  Place clean sheets on your bed the night of your first shower and do not  sleep with pets. Day of Surgery : Do not apply any lotions/deodorants the morning of surgery.  Please wear clean clothes to the hospital/surgery center.  FAILURE TO FOLLOW THESE INSTRUCTIONS MAY RESULT IN THE CANCELLATION OF YOUR SURGERY PATIENT SIGNATURE_________________________________  NURSE SIGNATURE__________________________________  ________________________________________________________________________   Adam Phenix  An incentive spirometer is a tool that can help keep your lungs clear and active. This tool measures how well you are filling your lungs with each breath. Taking long deep breaths may help reverse or decrease the chance of developing breathing (pulmonary) problems (especially infection) following:  A long period of time when you are unable to move or be active. BEFORE THE PROCEDURE   If the spirometer includes an indicator to show your best effort, your nurse or respiratory therapist will set it to a desired goal.  If possible, sit up straight or lean slightly forward. Try not to slouch.  Hold the incentive spirometer in an upright position. INSTRUCTIONS FOR USE   1. Sit on the edge of your bed if possible, or sit up as far as you can in bed or on a chair. 2. Hold the incentive spirometer in an upright position. 3. Breathe out normally. 4. Place the mouthpiece in your mouth and seal your lips tightly around it. 5. Breathe in slowly and as deeply as possible, raising the piston or the ball toward the top of the column. 6. Hold your breath for 3-5 seconds or for as long as possible. Allow the piston or ball to fall to the bottom of the column. 7. Remove the mouthpiece from your mouth and breathe out normally. 8. Rest for a few seconds and repeat Steps 1 through 7 at least 10 times every 1-2 hours when you are awake. Take your time and take a few normal breaths between deep breaths. 9. The spirometer may include an indicator to  show your best effort. Use the indicator as a goal to work toward during each repetition. 10. After each set of 10 deep breaths, practice coughing to be sure your lungs are clear. If you have an incision (the cut made at the time of surgery), support your incision when coughing by placing a pillow or rolled up towels firmly against it. Once you are able to get out of bed, walk around indoors and cough well. You may stop using the incentive spirometer when instructed by your caregiver.  RISKS AND COMPLICATIONS  Take your time so you do not get dizzy or light-headed.  If you are in pain, you may need to take or ask for pain medication before doing incentive spirometry. It is harder to take a deep breath if you are having pain. AFTER USE  Rest and breathe slowly and easily.  It can be helpful to keep track of a log of your progress. Your caregiver can provide you with a simple table to help with this. If you are using the spirometer at home, follow these instructions: [place] IF:   You are having difficultly using the spirometer.  You have trouble using the spirometer as often as instructed.  Your pain medication is  not giving enough relief while using the spirometer.  You develop fever of 100.5 F (38.1 C) or higher. SEEK IMMEDIATE MEDICAL CARE IF:   You cough up bloody sputum that had not been present before.  You develop fever of 102 F (38.9 C) or greater.  You develop worsening pain at or near the incision site. MAKE SURE YOU:   Understand these instructions.  Will watch your condition.  Will get help right away if you are not doing well or get worse. Document Released: 04/23/2007 Document Revised: 03/04/2012 Document Reviewed: 06/24/2007 [facility] Patient Information 2014 [place], [place].   ________________________________________________________________________

## 2018-10-03 NOTE — Progress Notes (Signed)
LOV/ CARDIAC CLEARANCE, DR [facility] , ON CHART 07-24-18   EKG 07-24-18 O CHART FROM [facility]  STRESS TEST 08-05-18 ON CHART [facility]  CBCDIFF, CMP 09-12-18 Epic

## 2018-10-04 ENCOUNTER — Other Ambulatory Visit: Payer: Self-pay

## 2018-10-04 ENCOUNTER — Encounter (HOSPITAL_COMMUNITY)
Admission: RE | Admit: 2018-10-04 | Discharge: 2018-10-04 | Disposition: A | Discharge status: Home / Self Care | Payer: BLUE CROSS/BLUE SHIELD | Source: Ambulatory Visit | Attending: Surgery | Admitting: Surgery

## 2018-10-04 ENCOUNTER — Encounter (HOSPITAL_COMMUNITY): Payer: Self-pay

## 2018-10-04 DIAGNOSIS — Z01812 Encounter for preprocedural laboratory examination: Secondary | ICD-10-CM | POA: Insufficient documentation

## 2018-10-04 HISTORY — DX: Diverticulitis of intestine, part unspecified, without perforation or abscess without bleeding: K57.92

## 2018-10-04 HISTORY — DX: Essential (primary) hypertension: I10

## 2018-10-04 HISTORY — DX: Fibromyalgia: M79.7

## 2018-10-04 HISTORY — DX: Cardiac murmur, unspecified: R01.1

## 2018-10-04 HISTORY — DX: Vitamin D deficiency, unspecified: E55.9

## 2018-10-04 LAB — HEMOGLOBIN A1C
HEMOGLOBIN A1C: 5.2 % (ref 4.8–5.6)
MEAN PLASMA GLUCOSE: 102.54 mg/dL

## 2018-10-08 NOTE — Anesthesia Preprocedure Evaluation (Addendum)
Anesthesia Evaluation  Patient identified by MRN, date of birth, ID band Patient awake    Reviewed: Allergy & Precautions, NPO status , Patient's Chart, lab work & pertinent test results, reviewed documented beta blocker date and time   History of Anesthesia Complications Negative for: history of anesthetic complications  Airway Mallampati: II  TM Distance: >3 FB Neck ROM: Full    Dental  (+) Dental Advisory Given, Partial Upper   Pulmonary former smoker,    breath sounds clear to auscultation       Cardiovascular hypertension, Pt. on home beta blockers and Pt. on medications (-) angina+ Cardiac Stents  + Valvular Problems/Murmurs  Rhythm:Regular Rate:Normal     Neuro/Psych  Neuromuscular disease negative psych ROS   GI/Hepatic negative GI ROS, Neg liver ROS,   Endo/Other  negative endocrine ROS  Renal/GU negative Renal ROS  negative genitourinary   Musculoskeletal  (+) Arthritis , Osteoarthritis and Rheumatoid disorders,  Fibromyalgia -  Abdominal   Peds  Hematology negative hematology ROS (+)   Anesthesia Other Findings   Reproductive/Obstetrics                            Anesthesia Physical Anesthesia Plan  ASA: III  Anesthesia Plan: General   Post-op Pain Management:    Induction: Intravenous  PONV Risk Score and Plan: 4 or greater and Treatment may vary due to age or medical condition, Ondansetron and Dexamethasone  Airway Management Planned: Oral ETT  Additional Equipment: None  Intra-op Plan:   Post-operative Plan: Extubation in OR  Informed Consent: I have reviewed the patients History and Physical, chart, labs and discussed the procedure including the risks, benefits and alternatives for the proposed anesthesia with the patient or authorized representative who has indicated his/her understanding and acceptance.   Dental advisory given  Plan Discussed with: CRNA and  Anesthesiologist  Anesthesia Plan Comments: (IV in each arm, BP cuff on each arm during case (alternate periodically))       Anesthesia Quick Evaluation

## 2018-10-09 ENCOUNTER — Inpatient Hospital Stay (HOSPITAL_COMMUNITY)
Admission: RE | Admit: 2018-10-09 | Discharge: 2018-10-11 | DRG: 331 | Disposition: A | Discharge status: Home / Self Care | Payer: BLUE CROSS/BLUE SHIELD | Source: Ambulatory Visit | Attending: Surgery | Admitting: Surgery

## 2018-10-09 ENCOUNTER — Inpatient Hospital Stay (HOSPITAL_COMMUNITY): Payer: BLUE CROSS/BLUE SHIELD | Admitting: Anesthesiology

## 2018-10-09 ENCOUNTER — Encounter (HOSPITAL_COMMUNITY): Payer: Self-pay | Admitting: General Practice

## 2018-10-09 ENCOUNTER — Encounter (HOSPITAL_COMMUNITY)
Admission: RE | Disposition: A | Discharge status: Home / Self Care | Payer: Self-pay | Source: Ambulatory Visit | Attending: Surgery

## 2018-10-09 DIAGNOSIS — D849 Immunodeficiency, unspecified: Secondary | ICD-10-CM | POA: Diagnosis present

## 2018-10-09 DIAGNOSIS — K660 Peritoneal adhesions (postprocedural) (postinfection): Secondary | ICD-10-CM | POA: Diagnosis present

## 2018-10-09 DIAGNOSIS — K432 Incisional hernia without obstruction or gangrene: Secondary | ICD-10-CM | POA: Diagnosis present

## 2018-10-09 DIAGNOSIS — K435 Parastomal hernia without obstruction or  gangrene: Secondary | ICD-10-CM | POA: Diagnosis present

## 2018-10-09 DIAGNOSIS — K5732 Diverticulitis of large intestine without perforation or abscess without bleeding: Secondary | ICD-10-CM | POA: Diagnosis present

## 2018-10-09 DIAGNOSIS — Z87891 Personal history of nicotine dependence: Secondary | ICD-10-CM | POA: Diagnosis not present

## 2018-10-09 DIAGNOSIS — Z933 Colostomy status: Secondary | ICD-10-CM

## 2018-10-09 DIAGNOSIS — Z7982 Long term (current) use of aspirin: Secondary | ICD-10-CM

## 2018-10-09 DIAGNOSIS — Z886 Allergy status to analgesic agent status: Secondary | ICD-10-CM | POA: Diagnosis not present

## 2018-10-09 DIAGNOSIS — Z79899 Other long term (current) drug therapy: Secondary | ICD-10-CM | POA: Diagnosis not present

## 2018-10-09 DIAGNOSIS — Z433 Encounter for attention to colostomy: Secondary | ICD-10-CM

## 2018-10-09 DIAGNOSIS — I10 Essential (primary) hypertension: Secondary | ICD-10-CM | POA: Diagnosis present

## 2018-10-09 DIAGNOSIS — D899 Disorder involving the immune mechanism, unspecified: Secondary | ICD-10-CM

## 2018-10-09 DIAGNOSIS — M0579 Rheumatoid arthritis with rheumatoid factor of multiple sites without organ or systems involvement: Secondary | ICD-10-CM | POA: Diagnosis present

## 2018-10-09 DIAGNOSIS — Z9884 Bariatric surgery status: Secondary | ICD-10-CM

## 2018-10-09 DIAGNOSIS — I2510 Atherosclerotic heart disease of native coronary artery without angina pectoris: Secondary | ICD-10-CM | POA: Diagnosis present

## 2018-10-09 DIAGNOSIS — K5720 Diverticulitis of large intestine with perforation and abscess without bleeding: Principal | ICD-10-CM | POA: Diagnosis present

## 2018-10-09 DIAGNOSIS — Z7952 Long term (current) use of systemic steroids: Secondary | ICD-10-CM

## 2018-10-09 DIAGNOSIS — E785 Hyperlipidemia, unspecified: Secondary | ICD-10-CM | POA: Diagnosis present

## 2018-10-09 DIAGNOSIS — Z8659 Personal history of other mental and behavioral disorders: Secondary | ICD-10-CM

## 2018-10-09 DIAGNOSIS — M797 Fibromyalgia: Secondary | ICD-10-CM | POA: Diagnosis present

## 2018-10-09 HISTORY — PX: PROCTOSCOPY: SHX2266

## 2018-10-09 SURGERY — CLOSURE, COLOSTOMY, ROBOT-ASSISTED
Anesthesia: General | Site: Abdomen

## 2018-10-09 MED ORDER — KETAMINE HCL 10 MG/ML IJ SOLN
INTRAMUSCULAR | Status: DC | PRN
  Administered 2018-10-09: 30 mg via INTRAVENOUS

## 2018-10-09 MED ORDER — NEOMYCIN SULFATE 500 MG PO TABS: 1000.0000 mg | ORAL_TABLET | ORAL | Status: DC

## 2018-10-09 MED ORDER — MIDAZOLAM HCL 2 MG/2ML IJ SOLN
INTRAMUSCULAR | Status: AC
  Filled 2018-10-09: qty 2

## 2018-10-09 MED ORDER — CHLORHEXIDINE GLUCONATE CLOTH 2 % EX PADS: 6.0000 | MEDICATED_PAD | Freq: Once | CUTANEOUS | Status: DC

## 2018-10-09 MED ORDER — ENOXAPARIN SODIUM 40 MG/0.4ML ~~LOC~~ SOLN
40.0000 mg | SUBCUTANEOUS | Status: DC
  Administered 2018-10-11: 40 mg via SUBCUTANEOUS
  Filled 2018-10-09 (×2): qty 0.4

## 2018-10-09 MED ORDER — LIP MEDEX EX OINT
TOPICAL_OINTMENT | CUTANEOUS | Status: AC
  Filled 2018-10-09: qty 7

## 2018-10-09 MED ORDER — ONDANSETRON HCL 4 MG/2ML IJ SOLN: 4.0000 mg | Freq: Once | INTRAMUSCULAR | Status: DC | PRN

## 2018-10-09 MED ORDER — DIPHENHYDRAMINE HCL 50 MG/ML IJ SOLN: 12.5000 mg | Freq: Four times a day (QID) | INTRAMUSCULAR | Status: DC | PRN

## 2018-10-09 MED ORDER — BUPIVACAINE-EPINEPHRINE 0.5% -1:200000 IJ SOLN
INTRAMUSCULAR | Status: AC
  Filled 2018-10-09: qty 1

## 2018-10-09 MED ORDER — SODIUM CHLORIDE 0.9 % IV SOLN
2.0000 g | INTRAVENOUS | Status: AC
  Administered 2018-10-09: 2 g via INTRAVENOUS
  Filled 2018-10-09: qty 2

## 2018-10-09 MED ORDER — BUPIVACAINE HCL (PF) 0.25 % IJ SOLN
INTRAMUSCULAR | Status: AC
  Filled 2018-10-09: qty 60

## 2018-10-09 MED ORDER — FENTANYL CITRATE (PF) 100 MCG/2ML IJ SOLN
25.0000 ug | INTRAMUSCULAR | Status: DC | PRN
  Administered 2018-10-09 (×3): 50 ug via INTRAVENOUS

## 2018-10-09 MED ORDER — LIDOCAINE 2% (20 MG/ML) 5 ML SYRINGE
INTRAMUSCULAR | Status: DC | PRN
  Administered 2018-10-09: 60 mg via INTRAVENOUS

## 2018-10-09 MED ORDER — LORATADINE 10 MG PO TABS
10.0000 mg | ORAL_TABLET | Freq: Every day | ORAL | Status: DC
  Administered 2018-10-10 – 2018-10-11 (×2): 10 mg via ORAL
  Filled 2018-10-09 (×2): qty 1

## 2018-10-09 MED ORDER — MIDAZOLAM HCL 2 MG/2ML IJ SOLN
INTRAMUSCULAR | Status: DC | PRN
  Administered 2018-10-09: 2 mg via INTRAVENOUS

## 2018-10-09 MED ORDER — DEXAMETHASONE SODIUM PHOSPHATE 10 MG/ML IJ SOLN
INTRAMUSCULAR | Status: AC
  Filled 2018-10-09: qty 1

## 2018-10-09 MED ORDER — LIDOCAINE HCL 2 % IJ SOLN
INTRAMUSCULAR | Status: AC
  Filled 2018-10-09: qty 20

## 2018-10-09 MED ORDER — TRAMADOL HCL 50 MG PO TABS: 50.0000 mg | ORAL_TABLET | ORAL | Status: DC | PRN

## 2018-10-09 MED ORDER — SODIUM CHLORIDE 0.9 % IV SOLN: 1000.0000 mL | Freq: Three times a day (TID) | INTRAVENOUS | Status: DC | PRN

## 2018-10-09 MED ORDER — HYDROMORPHONE HCL 1 MG/ML IJ SOLN
0.5000 mg | INTRAMUSCULAR | Status: DC | PRN
  Administered 2018-10-09 – 2018-10-10 (×3): 2 mg via INTRAVENOUS
  Administered 2018-10-10: 1 mg via INTRAVENOUS
  Administered 2018-10-10 (×2): 2 mg via INTRAVENOUS
  Administered 2018-10-10: 1 mg via INTRAVENOUS
  Filled 2018-10-09 (×4): qty 2
  Filled 2018-10-09: qty 1
  Filled 2018-10-09 (×2): qty 2

## 2018-10-09 MED ORDER — LIDOCAINE 2% (20 MG/ML) 5 ML SYRINGE
INTRAMUSCULAR | Status: DC | PRN
  Administered 2018-10-09: 1.5 mg/kg/h via INTRAVENOUS

## 2018-10-09 MED ORDER — SODIUM CHLORIDE 0.9 % IV SOLN
INTRAVENOUS | Status: DC
  Filled 2018-10-09: qty 6

## 2018-10-09 MED ORDER — SODIUM CHLORIDE 0.9 % IJ SOLN
INTRAMUSCULAR | Status: DC | PRN
  Administered 2018-10-09: 20 mL

## 2018-10-09 MED ORDER — ONDANSETRON HCL 4 MG/2ML IJ SOLN
INTRAMUSCULAR | Status: DC | PRN
  Administered 2018-10-09: 4 mg via INTRAVENOUS

## 2018-10-09 MED ORDER — ENOXAPARIN SODIUM 40 MG/0.4ML ~~LOC~~ SOLN
40.0000 mg | Freq: Once | SUBCUTANEOUS | Status: AC
  Administered 2018-10-09: 40 mg via SUBCUTANEOUS
  Filled 2018-10-09: qty 0.4

## 2018-10-09 MED ORDER — PROPOFOL 10 MG/ML IV BOLUS
INTRAVENOUS | Status: DC | PRN
  Administered 2018-10-09: 110 mg via INTRAVENOUS

## 2018-10-09 MED ORDER — FOLIC ACID 1 MG PO TABS
2.0000 mg | ORAL_TABLET | Freq: Every day | ORAL | Status: DC
  Administered 2018-10-10 – 2018-10-11 (×2): 2 mg via ORAL
  Filled 2018-10-09 (×2): qty 2

## 2018-10-09 MED ORDER — LACTATED RINGERS IV SOLN
Freq: Once | INTRAVENOUS | Status: AC
  Administered 2018-10-09: 08:00:00 via INTRAVENOUS

## 2018-10-09 MED ORDER — ENSURE SURGERY PO LIQD
237.0000 mL | Freq: Two times a day (BID) | ORAL | Status: DC
  Administered 2018-10-10 (×2): 237 mL via ORAL
  Filled 2018-10-09 (×4): qty 237

## 2018-10-09 MED ORDER — DICLOFENAC SODIUM 1 % TD GEL: 4.0000 g | Freq: Four times a day (QID) | TRANSDERMAL | Status: DC | PRN

## 2018-10-09 MED ORDER — PREDNISONE 5 MG PO TABS
5.0000 mg | ORAL_TABLET | ORAL | Status: DC
  Administered 2018-10-10 – 2018-10-11 (×2): 5 mg via ORAL
  Filled 2018-10-09 (×2): qty 1

## 2018-10-09 MED ORDER — FAMOTIDINE 20 MG PO TABS
20.0000 mg | ORAL_TABLET | Freq: Two times a day (BID) | ORAL | Status: DC
  Administered 2018-10-09 – 2018-10-11 (×4): 20 mg via ORAL
  Filled 2018-10-09 (×4): qty 1

## 2018-10-09 MED ORDER — ALUM & MAG HYDROXIDE-SIMETH 200-200-20 MG/5ML PO SUSP: 30.0000 mL | Freq: Four times a day (QID) | ORAL | Status: DC | PRN

## 2018-10-09 MED ORDER — NITROGLYCERIN 0.4 MG SL SUBL: 0.4000 mg | SUBLINGUAL_TABLET | SUBLINGUAL | Status: DC | PRN

## 2018-10-09 MED ORDER — ESTROGENS CONJUGATED 0.3 MG PO TABS
0.3000 mg | ORAL_TABLET | Freq: Every day | ORAL | Status: DC
  Administered 2018-10-10 – 2018-10-11 (×2): 0.3 mg via ORAL
  Filled 2018-10-09 (×2): qty 1

## 2018-10-09 MED ORDER — HYDROCHLOROTHIAZIDE 25 MG PO TABS
25.0000 mg | ORAL_TABLET | Freq: Every day | ORAL | Status: DC
  Administered 2018-10-10 – 2018-10-11 (×2): 25 mg via ORAL
  Filled 2018-10-09 (×2): qty 1

## 2018-10-09 MED ORDER — LACTATED RINGERS IV SOLN
INTRAVENOUS | Status: DC | PRN
  Administered 2018-10-09 (×2): via INTRAVENOUS

## 2018-10-09 MED ORDER — LACTATED RINGERS IV SOLN
INTRAVENOUS | Status: DC | PRN
  Administered 2018-10-09: 09:00:00 via INTRAVENOUS

## 2018-10-09 MED ORDER — POLYETHYLENE GLYCOL 3350 17 GM/SCOOP PO POWD: 1.0000 | Freq: Once | ORAL | Status: DC

## 2018-10-09 MED ORDER — OLMESARTAN MEDOXOMIL-HCTZ 40-25 MG PO TABS: 1.0000 | ORAL_TABLET | Freq: Every day | ORAL | Status: DC

## 2018-10-09 MED ORDER — ONDANSETRON HCL 4 MG/2ML IJ SOLN
INTRAMUSCULAR | Status: AC
  Filled 2018-10-09: qty 2

## 2018-10-09 MED ORDER — LIDOCAINE 2% (20 MG/ML) 5 ML SYRINGE: INTRAMUSCULAR | Status: DC | PRN

## 2018-10-09 MED ORDER — FENTANYL CITRATE (PF) 250 MCG/5ML IJ SOLN
INTRAMUSCULAR | Status: AC
  Filled 2018-10-09: qty 5

## 2018-10-09 MED ORDER — LIDOCAINE 2% (20 MG/ML) 5 ML SYRINGE
INTRAMUSCULAR | Status: AC
  Filled 2018-10-09: qty 5

## 2018-10-09 MED ORDER — ONDANSETRON HCL 4 MG PO TABS: 4.0000 mg | ORAL_TABLET | Freq: Four times a day (QID) | ORAL | Status: DC | PRN

## 2018-10-09 MED ORDER — HYDROMORPHONE HCL 1 MG/ML IJ SOLN
INTRAMUSCULAR | Status: AC
  Filled 2018-10-09: qty 1

## 2018-10-09 MED ORDER — OXYCODONE HCL 5 MG PO TABS: 5.0000 mg | ORAL_TABLET | Freq: Once | ORAL | Status: DC | PRN

## 2018-10-09 MED ORDER — BUPIVACAINE-EPINEPHRINE 0.5% -1:200000 IJ SOLN
INTRAMUSCULAR | Status: DC | PRN
  Administered 2018-10-09: 30 mL

## 2018-10-09 MED ORDER — ROCURONIUM BROMIDE 100 MG/10ML IV SOLN
INTRAVENOUS | Status: AC
  Filled 2018-10-09: qty 1

## 2018-10-09 MED ORDER — CELECOXIB 200 MG PO CAPS
200.0000 mg | ORAL_CAPSULE | ORAL | Status: AC
  Administered 2018-10-09: 200 mg via ORAL
  Filled 2018-10-09: qty 1

## 2018-10-09 MED ORDER — SODIUM CHLORIDE 0.9 % IV SOLN
INTRAVENOUS | Status: DC | PRN
  Administered 2018-10-09: 1140 mL via INTRAPERITONEAL

## 2018-10-09 MED ORDER — IRBESARTAN 300 MG PO TABS
300.0000 mg | ORAL_TABLET | Freq: Every day | ORAL | Status: DC
  Administered 2018-10-10 – 2018-10-11 (×2): 300 mg via ORAL
  Filled 2018-10-09 (×2): qty 1

## 2018-10-09 MED ORDER — SUGAMMADEX SODIUM 200 MG/2ML IV SOLN
INTRAVENOUS | Status: AC
  Filled 2018-10-09: qty 2

## 2018-10-09 MED ORDER — ALVIMOPAN 12 MG PO CAPS
12.0000 mg | ORAL_CAPSULE | Freq: Two times a day (BID) | ORAL | Status: DC
  Filled 2018-10-09 (×2): qty 1

## 2018-10-09 MED ORDER — ROCURONIUM BROMIDE 100 MG/10ML IV SOLN
INTRAVENOUS | Status: DC | PRN
  Administered 2018-10-09: 10 mg via INTRAVENOUS
  Administered 2018-10-09 (×2): 20 mg via INTRAVENOUS
  Administered 2018-10-09: 60 mg via INTRAVENOUS
  Administered 2018-10-09: 20 mg via INTRAVENOUS

## 2018-10-09 MED ORDER — PROCHLORPERAZINE MALEATE 10 MG PO TABS: 10.0000 mg | ORAL_TABLET | Freq: Four times a day (QID) | ORAL | Status: DC | PRN

## 2018-10-09 MED ORDER — FENTANYL CITRATE (PF) 100 MCG/2ML IJ SOLN
INTRAMUSCULAR | Status: AC
  Filled 2018-10-09: qty 4

## 2018-10-09 MED ORDER — SODIUM CHLORIDE 0.9 % IJ SOLN
INTRAMUSCULAR | Status: AC
  Filled 2018-10-09: qty 20

## 2018-10-09 MED ORDER — ACETAMINOPHEN 500 MG PO TABS
1000.0000 mg | ORAL_TABLET | ORAL | Status: AC
  Administered 2018-10-09: 1000 mg via ORAL
  Filled 2018-10-09: qty 2

## 2018-10-09 MED ORDER — KETAMINE HCL 10 MG/ML IJ SOLN
INTRAMUSCULAR | Status: AC
  Filled 2018-10-09: qty 1

## 2018-10-09 MED ORDER — BUPIVACAINE LIPOSOME 1.3 % IJ SUSP
20.0000 mL | Freq: Once | INTRAMUSCULAR | Status: AC
  Administered 2018-10-09: 20 mL
  Filled 2018-10-09: qty 20

## 2018-10-09 MED ORDER — FENTANYL CITRATE (PF) 100 MCG/2ML IJ SOLN
INTRAMUSCULAR | Status: AC
  Filled 2018-10-09: qty 2

## 2018-10-09 MED ORDER — BISACODYL 5 MG PO TBEC
20.0000 mg | DELAYED_RELEASE_TABLET | Freq: Once | ORAL | Status: DC
  Filled 2018-10-09: qty 4

## 2018-10-09 MED ORDER — METOPROLOL SUCCINATE ER 25 MG PO TB24
25.0000 mg | ORAL_TABLET | Freq: Every day | ORAL | Status: DC
  Administered 2018-10-10 – 2018-10-11 (×2): 25 mg via ORAL
  Filled 2018-10-09 (×2): qty 1

## 2018-10-09 MED ORDER — METRONIDAZOLE 500 MG PO TABS: 1000.0000 mg | ORAL_TABLET | ORAL | Status: DC

## 2018-10-09 MED ORDER — ACETAMINOPHEN 500 MG PO TABS
1000.0000 mg | ORAL_TABLET | Freq: Four times a day (QID) | ORAL | Status: DC
  Administered 2018-10-09 – 2018-10-11 (×7): 1000 mg via ORAL
  Filled 2018-10-09 (×7): qty 2

## 2018-10-09 MED ORDER — DEXAMETHASONE SODIUM PHOSPHATE 10 MG/ML IJ SOLN
INTRAMUSCULAR | Status: DC | PRN
  Administered 2018-10-09: 10 mg via INTRAVENOUS

## 2018-10-09 MED ORDER — ASPIRIN EC 81 MG PO TBEC
81.0000 mg | DELAYED_RELEASE_TABLET | Freq: Every day | ORAL | Status: DC
  Administered 2018-10-10 – 2018-10-11 (×2): 81 mg via ORAL
  Filled 2018-10-09 (×2): qty 1

## 2018-10-09 MED ORDER — PROCHLORPERAZINE EDISYLATE 10 MG/2ML IJ SOLN: 5.0000 mg | Freq: Four times a day (QID) | INTRAMUSCULAR | Status: DC | PRN

## 2018-10-09 MED ORDER — DIPHENHYDRAMINE HCL 12.5 MG/5ML PO ELIX: 12.5000 mg | ORAL_SOLUTION | Freq: Four times a day (QID) | ORAL | Status: DC | PRN

## 2018-10-09 MED ORDER — OXYCODONE HCL 5 MG/5ML PO SOLN: 5.0000 mg | Freq: Once | ORAL | Status: DC | PRN

## 2018-10-09 MED ORDER — SUGAMMADEX SODIUM 200 MG/2ML IV SOLN
INTRAVENOUS | Status: DC | PRN
  Administered 2018-10-09: 150 mg via INTRAVENOUS

## 2018-10-09 MED ORDER — HYDRALAZINE HCL 20 MG/ML IJ SOLN: 10.0000 mg | INTRAMUSCULAR | Status: DC | PRN

## 2018-10-09 MED ORDER — METOCLOPRAMIDE HCL 5 MG/ML IJ SOLN: 10.0000 mg | Freq: Four times a day (QID) | INTRAMUSCULAR | Status: DC | PRN

## 2018-10-09 MED ORDER — TRAMADOL HCL 50 MG PO TABS: 50.0000 mg | ORAL_TABLET | Freq: Four times a day (QID) | ORAL | 0 refills | Status: DC | PRN

## 2018-10-09 MED ORDER — LACTATED RINGERS IV SOLN
INTRAVENOUS | Status: DC
  Administered 2018-10-09: 17:00:00 via INTRAVENOUS

## 2018-10-09 MED ORDER — 0.9 % SODIUM CHLORIDE (POUR BTL) OPTIME
TOPICAL | Status: DC | PRN
  Administered 2018-10-09: 2000 mL

## 2018-10-09 MED ORDER — HYDROMORPHONE HCL 1 MG/ML IJ SOLN
0.5000 mg | INTRAMUSCULAR | Status: DC | PRN
  Administered 2018-10-09: 0.5 mg via INTRAVENOUS
  Administered 2018-10-09: 1 mg via INTRAVENOUS
  Filled 2018-10-09: qty 1

## 2018-10-09 MED ORDER — SODIUM CHLORIDE 0.9 % IV SOLN
2.0000 g | Freq: Two times a day (BID) | INTRAVENOUS | Status: AC
  Administered 2018-10-09: 2 g via INTRAVENOUS
  Filled 2018-10-09: qty 2

## 2018-10-09 MED ORDER — TRAMADOL HCL 50 MG PO TABS
50.0000 mg | ORAL_TABLET | Freq: Four times a day (QID) | ORAL | Status: DC | PRN
  Administered 2018-10-09 – 2018-10-11 (×4): 100 mg via ORAL
  Filled 2018-10-09 (×4): qty 2

## 2018-10-09 MED ORDER — METOPROLOL TARTRATE 5 MG/5ML IV SOLN: 5.0000 mg | Freq: Four times a day (QID) | INTRAVENOUS | Status: DC | PRN

## 2018-10-09 MED ORDER — GABAPENTIN 300 MG PO CAPS
300.0000 mg | ORAL_CAPSULE | ORAL | Status: AC
  Administered 2018-10-09: 300 mg via ORAL
  Filled 2018-10-09: qty 1

## 2018-10-09 MED ORDER — ALVIMOPAN 12 MG PO CAPS
12.0000 mg | ORAL_CAPSULE | ORAL | Status: AC
  Administered 2018-10-09: 12 mg via ORAL
  Filled 2018-10-09: qty 1

## 2018-10-09 MED ORDER — VITAMIN D 1000 UNITS PO TABS
5000.0000 [IU] | ORAL_TABLET | Freq: Every day | ORAL | Status: DC
  Administered 2018-10-10 – 2018-10-11 (×2): 5000 [IU] via ORAL
  Filled 2018-10-09 (×3): qty 5

## 2018-10-09 MED ORDER — PROPOFOL 10 MG/ML IV BOLUS
INTRAVENOUS | Status: AC
  Filled 2018-10-09: qty 20

## 2018-10-09 MED ORDER — GABAPENTIN 300 MG PO CAPS
300.0000 mg | ORAL_CAPSULE | Freq: Two times a day (BID) | ORAL | Status: DC
  Administered 2018-10-09 – 2018-10-11 (×4): 300 mg via ORAL
  Filled 2018-10-09 (×4): qty 1

## 2018-10-09 MED ORDER — SERTRALINE HCL 50 MG PO TABS
50.0000 mg | ORAL_TABLET | Freq: Every day | ORAL | Status: DC
  Administered 2018-10-10 – 2018-10-11 (×2): 50 mg via ORAL
  Filled 2018-10-09 (×2): qty 1

## 2018-10-09 MED ORDER — ALPRAZOLAM 1 MG PO TABS: 1.0000 mg | ORAL_TABLET | Freq: Every evening | ORAL | Status: DC | PRN

## 2018-10-09 MED ORDER — SACCHAROMYCES BOULARDII 250 MG PO CAPS
250.0000 mg | ORAL_CAPSULE | Freq: Two times a day (BID) | ORAL | Status: DC
  Administered 2018-10-09 – 2018-10-11 (×4): 250 mg via ORAL
  Filled 2018-10-09 (×4): qty 1

## 2018-10-09 MED ORDER — ONDANSETRON HCL 4 MG/2ML IJ SOLN
4.0000 mg | Freq: Four times a day (QID) | INTRAMUSCULAR | Status: DC | PRN
  Administered 2018-10-11: 4 mg via INTRAVENOUS
  Filled 2018-10-09: qty 2

## 2018-10-09 MED ORDER — FENTANYL CITRATE (PF) 250 MCG/5ML IJ SOLN
INTRAMUSCULAR | Status: DC | PRN
  Administered 2018-10-09 (×5): 50 ug via INTRAVENOUS
  Administered 2018-10-09: 100 ug via INTRAVENOUS
  Administered 2018-10-09 (×2): 50 ug via INTRAVENOUS

## 2018-10-09 MED ORDER — ESTROPIPATE 1.5 MG PO TABS: 0.7500 mg | ORAL_TABLET | Freq: Every day | ORAL | Status: DC

## 2018-10-09 SURGICAL SUPPLY — 115 items
APPLIER CLIP 5 13 M/L LIGAMAX5 (MISCELLANEOUS)
APPLIER CLIP ROT 10 11.4 M/L (STAPLE)
BLADE EXTENDED COATED 6.5IN (ELECTRODE) ×3 IMPLANT
CANNULA REDUC XI 12-8 STAPL (CANNULA) ×2
CANNULA REDUCER 12-8 DVNC XI (CANNULA) ×4 IMPLANT
CATH FOLEY 2WAY SLVR  5CC 26FR (CATHETERS) ×1
CATH FOLEY 2WAY SLVR 5CC 26FR (CATHETERS) ×2 IMPLANT
CELLS DAT CNTRL 66122 CELL SVR (MISCELLANEOUS) IMPLANT
CHLORAPREP W/TINT 26ML (MISCELLANEOUS) ×3 IMPLANT
CLIP APPLIE 5 13 M/L LIGAMAX5 (MISCELLANEOUS) IMPLANT
CLIP APPLIE ROT 10 11.4 M/L (STAPLE) IMPLANT
CLIP VESOLOCK LG 6/CT PURPLE (CLIP) IMPLANT
CLIP VESOLOCK MED LG 6/CT (CLIP) IMPLANT
COVER SURGICAL LIGHT HANDLE (MISCELLANEOUS) ×6 IMPLANT
COVER TIP SHEARS 8 DVNC (MISCELLANEOUS) ×2 IMPLANT
COVER TIP SHEARS 8MM DA VINCI (MISCELLANEOUS) ×1
COVER WAND RF STERILE (DRAPES) IMPLANT
DECANTER SPIKE VIAL GLASS SM (MISCELLANEOUS) ×3 IMPLANT
DEVICE TROCAR PUNCTURE CLOSURE (ENDOMECHANICALS) IMPLANT
DRAIN CHANNEL 19F RND (DRAIN) IMPLANT
DRAPE ARM DVNC X/XI (DISPOSABLE) ×8 IMPLANT
DRAPE COLUMN DVNC XI (DISPOSABLE) ×2 IMPLANT
DRAPE DA VINCI XI ARM (DISPOSABLE) ×4
DRAPE DA VINCI XI COLUMN (DISPOSABLE) ×1
DRAPE INCISE IOBAN 66X45 STRL (DRAPES) ×3 IMPLANT
DRAPE SURG IRRIG POUCH 19X23 (DRAPES) ×3 IMPLANT
DRSG OPSITE POSTOP 4X10 (GAUZE/BANDAGES/DRESSINGS) IMPLANT
DRSG OPSITE POSTOP 4X6 (GAUZE/BANDAGES/DRESSINGS) ×3 IMPLANT
DRSG OPSITE POSTOP 4X8 (GAUZE/BANDAGES/DRESSINGS) IMPLANT
DRSG TEGADERM 2-3/8X2-3/4 SM (GAUZE/BANDAGES/DRESSINGS) ×3 IMPLANT
DRSG TEGADERM 4X4.75 (GAUZE/BANDAGES/DRESSINGS) ×15 IMPLANT
ELECT PENCIL ROCKER SW 15FT (MISCELLANEOUS) ×3 IMPLANT
ELECT REM PT RETURN 15FT ADLT (MISCELLANEOUS) ×3 IMPLANT
ENDOLOOP SUT PDS II  0 18 (SUTURE)
ENDOLOOP SUT PDS II 0 18 (SUTURE) IMPLANT
EVACUATOR SILICONE 100CC (DRAIN) IMPLANT
GAUZE SPONGE 2X2 8PLY STRL LF (GAUZE/BANDAGES/DRESSINGS) ×2 IMPLANT
GAUZE SPONGE 4X4 12PLY STRL (GAUZE/BANDAGES/DRESSINGS) IMPLANT
GLOVE BIOGEL PI IND STRL 6.5 (GLOVE) ×4 IMPLANT
GLOVE BIOGEL PI IND STRL 7.0 (GLOVE) ×12 IMPLANT
GLOVE BIOGEL PI INDICATOR 6.5 (GLOVE) ×2
GLOVE BIOGEL PI INDICATOR 7.0 (GLOVE) ×6
GLOVE ECLIPSE 6.5 STRL STRAW (GLOVE) ×6 IMPLANT
GLOVE ECLIPSE 8.0 STRL XLNG CF (GLOVE) ×12 IMPLANT
GLOVE INDICATOR 8.0 STRL GRN (GLOVE) ×12 IMPLANT
GLOVE SURG SS PI 6.5 STRL IVOR (GLOVE) ×6 IMPLANT
GOWN SPEC L3 XXLG W/TWL (GOWN DISPOSABLE) ×6 IMPLANT
GOWN STRL REUS W/ TWL LRG LVL3 (GOWN DISPOSABLE) ×12 IMPLANT
GOWN STRL REUS W/TWL LRG LVL3 (GOWN DISPOSABLE) ×6
GOWN STRL REUS W/TWL XL LVL3 (GOWN DISPOSABLE) ×15 IMPLANT
GRASPER SUT TROCAR 14GX15 (MISCELLANEOUS) ×3 IMPLANT
HOLDER FOLEY CATH W/STRAP (MISCELLANEOUS) ×3 IMPLANT
IRRIG SUCT STRYKERFLOW 2 WTIP (MISCELLANEOUS) ×3
IRRIGATION SUCT STRKRFLW 2 WTP (MISCELLANEOUS) ×2 IMPLANT
IRRIGATOR SUCT 8 DISP DVNC XI (IRRIGATION / IRRIGATOR) IMPLANT
IRRIGATOR SUCTION 8MM XI DISP (IRRIGATION / IRRIGATOR)
KIT PROCEDURE DA VINCI SI (MISCELLANEOUS) ×1
KIT PROCEDURE DVNC SI (MISCELLANEOUS) ×2 IMPLANT
KIT SIGMOIDOSCOPE (SET/KITS/TRAYS/PACK) ×3 IMPLANT
LUBRICANT JELLY K Y 4OZ (MISCELLANEOUS) ×3 IMPLANT
NEEDLE INSUFFLATION 14GA 120MM (NEEDLE) ×3 IMPLANT
PACK CARDIOVASCULAR III (CUSTOM PROCEDURE TRAY) ×3 IMPLANT
PACK COLON (CUSTOM PROCEDURE TRAY) ×3 IMPLANT
PAD POSITIONING PINK XL (MISCELLANEOUS) ×3 IMPLANT
PORT LAP GEL ALEXIS MED 5-9CM (MISCELLANEOUS) ×3 IMPLANT
POSITIONER SURGICAL ARM (MISCELLANEOUS) ×6 IMPLANT
RTRCTR WOUND ALEXIS 18CM MED (MISCELLANEOUS)
SCISSORS LAP 5X35 DISP (ENDOMECHANICALS) ×3 IMPLANT
SEAL CANN UNIV 5-8 DVNC XI (MISCELLANEOUS) ×8 IMPLANT
SEAL XI 5MM-8MM UNIVERSAL (MISCELLANEOUS) ×4
SEALER VESSEL DA VINCI XI (MISCELLANEOUS) ×1
SEALER VESSEL EXT DVNC XI (MISCELLANEOUS) ×2 IMPLANT
SLEEVE ADV FIXATION 5X100MM (TROCAR) IMPLANT
SOLUTION ELECTROLUBE (MISCELLANEOUS) ×3 IMPLANT
SPONGE GAUZE 2X2 STER 10/PKG (GAUZE/BANDAGES/DRESSINGS) ×1
SPONGE LAP 18X18 X RAY DECT (DISPOSABLE) ×3 IMPLANT
STAPLER 45 BLU RELOAD XI (STAPLE) IMPLANT
STAPLER 45 BLUE RELOAD XI (STAPLE)
STAPLER 45 GREEN RELOAD XI (STAPLE) ×2
STAPLER 45 GRN RELOAD XI (STAPLE) ×4 IMPLANT
STAPLER CANNULA SEAL DVNC XI (STAPLE) ×2 IMPLANT
STAPLER CANNULA SEAL XI (STAPLE) ×1
STAPLER ECHELON POWER CIR 31 (STAPLE) ×3 IMPLANT
STAPLER SHEATH (SHEATH) ×1
STAPLER SHEATH ENDOWRIST DVNC (SHEATH) ×2 IMPLANT
SUT MNCRL AB 4-0 PS2 18 (SUTURE) ×6 IMPLANT
SUT PDS AB 1 CT1 27 (SUTURE) ×12 IMPLANT
SUT PDS AB 1 CTX 36 (SUTURE) IMPLANT
SUT PDS AB 1 TP1 96 (SUTURE) IMPLANT
SUT PDS AB 2-0 CT2 27 (SUTURE) IMPLANT
SUT PROLENE 0 CT 2 (SUTURE) ×3 IMPLANT
SUT PROLENE 2 0 KS (SUTURE) ×3 IMPLANT
SUT PROLENE 2 0 SH DA (SUTURE) IMPLANT
SUT SILK 2 0 (SUTURE) ×1
SUT SILK 2 0 SH CR/8 (SUTURE) ×3 IMPLANT
SUT SILK 2-0 18XBRD TIE 12 (SUTURE) ×2 IMPLANT
SUT SILK 3 0 (SUTURE) ×1
SUT SILK 3 0 SH CR/8 (SUTURE) ×6 IMPLANT
SUT SILK 3-0 18XBRD TIE 12 (SUTURE) ×2 IMPLANT
SUT V-LOC BARB 180 2/0GR6 GS22 (SUTURE)
SUT VIC AB 2-0 SH 18 (SUTURE) ×3 IMPLANT
SUT VIC AB 3-0 SH 18 (SUTURE) ×3 IMPLANT
SUT VIC AB 3-0 SH 27 (SUTURE) ×1
SUT VIC AB 3-0 SH 27XBRD (SUTURE) ×2 IMPLANT
SUT VICRYL 0 UR6 27IN ABS (SUTURE) ×3 IMPLANT
SUTURE V-LC BRB 180 2/0GR6GS22 (SUTURE) IMPLANT
SYR 10ML LL (SYRINGE) ×3 IMPLANT
SYS LAPSCP GELPORT 120MM (MISCELLANEOUS)
SYSTEM LAPSCP GELPORT 120MM (MISCELLANEOUS) IMPLANT
TAPE UMBILICAL COTTON 1/8X30 (MISCELLANEOUS) ×3 IMPLANT
TOWEL OR NON WOVEN STRL DISP B (DISPOSABLE) ×3 IMPLANT
TRAY FOLEY W/BAG SLVR 14FR (SET/KITS/TRAYS/PACK) ×3 IMPLANT
TROCAR ADV FIXATION 5X100MM (TROCAR) ×3 IMPLANT
TUBING CONNECTING 10 (TUBING) ×6 IMPLANT
TUBING INSUFFLATION 10FT LAP (TUBING) ×3 IMPLANT

## 2018-10-09 NOTE — Progress Notes (Signed)
Dressing on the incision site is saturated with bloody drainage , cleansed ans dressing was changed. We will continue to monitor.

## 2018-10-09 NOTE — Progress Notes (Signed)
Patient was assisted to get out of the bed to walk ,when suddenly blood starts dripping down on her legs and to the floor patient was assisted back in the bed ,dressing on patients incision was quickly checked pressure was applied . Honeycomb dressing was changed and Abd pad and tape was also added to secure the dressing.Patient tolerates the dressing changed and was complaining of pain and pain medicine was given. We will notify on call provider and . We'll continue to monitor.

## 2018-10-09 NOTE — Op Note (Signed)
10/09/2018  1:38 PM  PATIENT:  Vanessa Collins  72 y.o. female  Patient Care Team: Alroy Dust, L.Marlou Sa, MD as PCP - General (Family Medicine) Michael Boston, MD as Consulting Physician (General Surgery) Charolette Forward, MD as Consulting Physician (Cardiology) Bo Merino, MD as Consulting Physician (Rheumatology) Ronnette Juniper, MD as Consulting Physician (Gastroenterology)  PRE-OPERATIVE DIAGNOSIS: Perforated diverticulitis status post open Hartmann resection WITH  desire for takedown of colostomy  POST-OPERATIVE DIAGNOSIS:     Perforated diverticulitis status post open Hartmann resection WITH  desire for takedown of colostomy. 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 An experienced assistant was required given the standard of surgical care given the complexity of the case.  This assistant was needed for exposure, dissection, suctioning, retraction, instrument exchange, etc.    ANESTHESIA:   local and general  EBL:  Total I/O In: 8937 [I.V.:1550; IV Piggyback:100] Out: 300 [Urine:200; Blood:100]  Delay start of Pharmacological VTE agent (>24hrs) due to surgical blood loss or risk of bleeding:  no  DRAINS: none   SPECIMEN:    1.  End colostomy. 2.  Rectosigmoid colon. 3.  Distal rectal anastomotic ring  DISPOSITION OF SPECIMEN:  PATHOLOGY  COUNTS:  YES  PLAN OF CARE: Admit to inpatient   PATIENT DISPOSITION:  PACU - hemodynamically stable.  INDICATION: Pleasant patient status post colectomy with end ostomy for perforated diverticulitis.  The patient has recovered from that surgery and has understandably requested ostomy takedown.  Medically  stabilized and felt reasonable to proceed.   I discussed the procedure with the patient:  The anatomy & physiology of the digestive tract was discussed.  The pathophysiology was discussed.  Possibility of remaining with an ostomy permanently was discussed.  I offered ostomy takedown.  Laparoscopic & open techniques were discussed.   Risks such as bleeding, infection, abscess, leak, reoperation, possible re-ostomy, injury to other organs, hernia, heart attack, death, and other risks were discussed.   I noted a good likelihood this will help address the problem.  Goals of post-operative recovery were discussed as well.  We will work to minimize complications.  Questions were answered.  The patient expresses understanding & wishes to proceed with surgery.  OR FINDINGS:   Moderate pelvic adhesions.  Moderate omental adhesions periumbilically and going up into parastomal hernia and periumbilical Swiss cheese hernias.  No bowel obstruction.  Remnant distal sigmoid with inflammation and stricture at mid rectum requiring low anterior resection.  DESCRIPTION:   Informed consent was confirmed.   The patient received IV antibiotics & underwent general anesthesia without any difficulty.  Foley catheter was sterilely placed. SCDs were active during the entire case.  I sutured the stomy closed using a pursestring  stitch.  The abdomen was prepped and draped in a sterile fashion.  A surgical timeout confirmed our plan.  I placed a port in the upper abdomen using varies entry technique along the left subcostal ridge at Palmer's point with the patient in steep reverse Trendelenburg.  Entry was clean.  I placed extra ports under visualization.  Could see prior laparoscopic adjustable gastric banding with banding going from right upper quadrant paramedian subcutaneous region up into the stomach.  No adhesions or inflammation.  Left lobe.  Proceeded  with lysis of adhesions to make sure there were no adhesions on the  anterior abdominal wall, to the ostomy, and to the pelvis.  This took several hours.  Initially started with freeing omental adhesions to the anterior abdominal wall.  Reduced out moderate swath of omentum in the moderate parastomal hernia around the left lower quadrant colostomy.  Freed up numerous small bowel loops off adhesions to the retroperitoneum and sigmoid mesentery and pelvis. Inspected small bowel loops to make sure no significant interloop adhesions or transition zones noted.  Did have to do dissection to take the rectosigmoid stump off the anterior abdominal wall and bladder.  W some sigmoid had been left.  Ended up elevating the rectosigmoid mesentery to be able to get into the presacral space and follow that down to be able to identify the true rectum distal to the sacral promontory.  Came around circumferentially.  The rectosigmoid colon was quite folded upon itself in a W type fashion down to the pelvis.  There was extra remaining sigmoid tissue.  It was rather inflamed consistent with persistent chronic diverticulitis.  It took some time to straighten this out to confirm anatomy.  There were dense adhesions to both adnexa.  We freed these adhesions off the rectosigmoid colon to the adnexa carefully.  I could see the left ureter and gonadal vessels and get those preserved and protected.  Right ureter and gonadal vessels further away not involved with pelvic dissection.  She was status post hysterectomy.  Eventually had to free adhesions to the vaginal cuff.  Eventually got to more soft normal-appearing rectum not inflamed.  Did little bit of lyse adhesions around the colostomy going through the anterior abdominal wall.  She had a moderate parastomal hernia site did not need to do much.  We therefore undocked the robot.  I made a biconcave curvilinear incision transversely around the ostomy.  I got into the subcutaneous tissues.  I used careful focused sharp dissection.  Some focused cautery  dissection as well.  That helped to free adhesions to the subcutaneous tisses & fascia.  I was able to enter into the peritoneum focally.  I did a gentle finger sweep.  Gradually came around circumferentially and freed the bowel from remaining adhesions to the abdominal wall.  We were able eviscerate some bowel proximally and distally.  Patient had plenty of remaining colon that could easily reach down to the pelvis.  Chose an area about 3 cm proximal to the skin of the colostomy with healthy mesentery.  Transected that with cautery & silk sutures.  I clamped the colon at the point of resection using a reusable pursestringer device.  Passed a 2-0 Keith needle. I transected at the descending/sigmoid junction with a scalpel. I got healthy bleeding mucosa.  We sent the rectosigmoid colon specimen off to go to pathology.  We sized the colon orifice.  I chose a 31 Ethicon EEA anvil stapler system.  I reinforced the prolene pursestring with interrupted silk suture.  I placed the anvil to the open end of the proximal remaining colon and closed around it using the pursestring.    We did copious irrigation with crystalloid solution.  Hemostasis was good.  The distal end of the colon at the handle easily reached down to the rectal stump, therefore, splenic flexure mobilization was not needed.  Placed a wound protector at the colostomy site with a 12 mm robotic port site robotic stapling to resect the external rectosigmoid colon.  Dr Marcello Moores scrubbed  down and did gentle anal dilation and advanced the EEA stapler up the rectal stump.  However she met resistance around 8-9 cm.  Did further robotic lysis of adhesions to come down to a persistent stricture in the mid rectum.  Initially thought it might of been due to some extra folds of the rectum as well as some thickened anterior peritoneal adhesions.  However these were freed and straightened up and still there is a tight stricture.  We therefore decided to transect at the mid  rectum just distal to the stricture.  I transected through the rectosigmoid mesentery, preserving most of the sigmoid mesentery to the retroperitoneum.  Transected through the these rectum at the mid rectum and skeletonized a little more inferiorly to the level of the stricture.  I stapled off at the mid rectum just distal to the stricture using a robotic stapler.  Remote.  First firing 90% across.  One more firing for the marked posterior corner.  With that, dilators can come up to the staple line without difficulty.  Proctoscopy confirmed no other obstruction or stricturing.  Dr. Marcello Moores is able to bring up the [facility] of the rectal stump.  The spike was brought out at the provimal end of the rectal stump under direct visualization.  I attached the anvil of the proximal colon the spike of the stapler. Anvil was tightened down and held clamped for 60 seconds. The EEA stapler was fired and held clamped for 30 seconds. The stapler was released & removed. We noted 2 excellent anastomotic rings. Blue stitch is in the proximal ring.  Dr Marcello Moores did rigid proctoscopy noted the anastomosis was at 8 cm from the anal verge consistent with the proximal rectum.  We did a final irrigation of antibiotic solution (900 mg clindamycin/240 mg gentamicin in a liter of crystalloid) & held that for the pelvic air leak test.  The rectum was insufflated the rectum while clamping the colon proximal to that anastomosis.  There was a negative air leak test. There was no tension of mesentery or bowel at the anastomosis.   Tissues looked viable.  Hemostasis was good.   Ureters & bowel uninjured. The anastomosis looked healthy.  The robot was undocked.  The rectosigmoid colon specimen was removed.  Greater omentum brought down towards the pelvis.  Ports and wound protector removed.  We changed gown and gloves.  The patient was re-draped per our clean protocol..  Sterile unused instruments were used from this point out per colon SSI  prevention protocol.  Patient had persistent periumbilical ventral hernia that was primarily repaired with #1 PDS interrupted sutures through the left lower quadrant colostomy.  Excise some excess hernia sac as well as redundant skin tissues around the colostomy.  Confirmed good healthy fascia.  I closed the abdominal wall ostomy wound using #1 PDS transverse running anterior rectus fascial closure. I closed the skin with some interrupted Monocryl stitches. I placed antibiotic-soaked wicks into the closure at the corners x2. I placed a sterile dressing.  Remaining port sites closed with 4-0 Monocryl suture.  They were too small to allow my pinky to go through, therefore not need to be more aggressively close to the fascia.  Patient extubated in recovery room stable.  Pleasant and chatty.  Denies pain.  I discussed operative findings, updated the patient's status, discussed probable steps to recovery, and gave postoperative recommendations to the patient's spouse.  Recommendations were made.  Questions were answered.  He expressed understanding & appreciation.  Adin Hector, M.D., F.A.C.S. Gastrointestinal and Minimally Invasive Surgery [facility] [address], [place], [place] [address] [phone] Main / Paging

## 2018-10-09 NOTE — Anesthesia Procedure Notes (Signed)
Procedure Name: Intubation Date/Time: 10/09/2018 8:24 AM Performed by: Florene Route, CRNA Patient Re-evaluated:Patient Re-evaluated prior to induction Oxygen Delivery Method: Circle system utilized Preoxygenation: Pre-oxygenation with 100% oxygen Induction Type: IV induction Ventilation: Mask ventilation without difficulty and Oral airway inserted - appropriate to patient size Laryngoscope Size: Hyacinth Meeker and 2 Grade View: Grade I Tube type: Oral Tube size: 7.5 mm Number of attempts: 1 Airway Equipment and Method: Stylet Placement Confirmation: ETT inserted through vocal cords under direct vision,  positive ETCO2 and breath sounds checked- equal and bilateral Secured at: 20 cm Tube secured with: Tape Dental Injury: Teeth and Oropharynx as per pre-operative assessment

## 2018-10-09 NOTE — Anesthesia Postprocedure Evaluation (Signed)
Anesthesia Post Note  Patient: Vanessa Collins  Procedure(s) Performed: XI ROBOTIC COLOSTOMY TAKEDOWN, LOWER ANTERIOR RESECTION, LYSIS OF ADHESIONS,PRIMARY INCISIONAL HERNIA REPAIR  ERAS PATHWAY (N/A Abdomen) RIDGID PROCTOSCOPY (N/A )     Patient location during evaluation: PACU Anesthesia Type: General Level of consciousness: awake and alert Pain management: pain level controlled Vital Signs Assessment: post-procedure vital signs reviewed and stable Respiratory status: spontaneous breathing, nonlabored ventilation and respiratory function stable Cardiovascular status: blood pressure returned to baseline and stable Postop Assessment: no apparent nausea or vomiting Anesthetic complications: no    Last Vitals:  Vitals:   10/09/18 1412 10/09/18 1415  BP: 112/60   Pulse: 75 73  Resp: 14   Temp:    SpO2: 100% 100%    Last Pain:  Vitals:   10/09/18 1412  TempSrc:   PainSc: 5                  Beryle Lathe

## 2018-10-09 NOTE — H&P (Signed)
Vanessa Collins DOB: [birth year]  `Patient Care Team: Clovis Riley, Elbert Ewings.August Saucer, MD as PCP - General (Family Medicine) Karie Soda, MD as Consulting Physician (General Surgery) Rinaldo Cloud, MD as Consulting Physician (Cardiology) Pollyann Savoy, MD as Consulting Physician (Rheumatology) Kerin Salen, MD as Consulting Physician (Gastroenterology)  ` Patient sent for surgical consultation at the request of Vanessa Collins, [place]  Chief Complaint: History emergency colectomy/colostomy Hartmann resection. 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 Vanessa Collins. Developed colonic perforation & required emergency Hartmann resection in [place] while visiting her mother in October 2018. Lives down here. Wishes to consider colostomy takedown. Discussed with primary care office. Surgical consultation requested. She is tapered down to just 2.5 mg prednisone a day. Stationary bike about an hour a day. Usually empties her colostomy bag about twice a day. No history of prior irritable bowel her irregular bowels. She does not smoke. She had C-section and hysterectomy but no other abdominal surgeries before her emergent Hartmann. She's never had a colonoscopy. She is followed by Dr. Sharyn Lull for HTN issues. No major cardiac disease and she is aware of  No personal nor family history of GI/colon cancer, inflammatory bowel disease, irritable bowel syndrome, allergy such as Celiac Sprue, dietary/dairy problems, colitis, ulcers nor gastritis. No recent sick contacts/gastroenteritis. No travel outside the country. No changes in diet. No dysphagia to solids or liquids. No significant heartburn or reflux. No hematochezia, hematemesis, coffee ground emesis. No evidence of prior gastric/peptic ulceration. She did have adjustable gastric banding done laparoscopically by Dr Daphine Deutscher in our group about 10 years ago. It looks like it's  been removed since then since there is no evidence on the CAT scan. She has not been in our office for that for 5 years.  Ready for surgery cardiac clearance done.  Stable on imuran.  Colonoscopy w one SSA polyp removed  (Review of systems as stated in this history (HPI) or in the review of systems. Otherwise all other 12 point ROS are negative) ` ` `   Problem List/Past Medical Ardeth Sportsman, MD; 07/15/2018 4:00 PM) RHEUMATOID ARTHRITIS INVOLVING MULTIPLE SITES WITH POSITIVE RHEUMATOID FACTOR (M05.79) FIBROMYALGIA (M79.7)  Past Surgical History Ardeth Sportsman, MD; 07/15/2018 3:18 PM) CREATION, HARTMANN POUCH, WITH COLOSTOMY OR ILEOSTOMY [id number]) [10/08/2017]Vanessa Collins, [place] for perforated colon  Allergies Vanessa Collins; 07/15/2018 3:12 PM) No Known Drug Allergies [07/15/2018]: Allergies Reconciled  Medication History Vanessa Collins; 07/15/2018 3:15 PM) Metoprolol Succinate ER (50MG  Tablet ER 24HR, Oral) Active. Simvastatin (80MG  Tablet, Oral) Active. ALPRAZolam (0.5MG  Tablet, Oral) Active. PredniSONE (5MG  Tablet, Oral) Active. Premarin (0.3MG  Tablet, Oral) Active. Sertraline HCl (50MG  Tablet, Oral) Active. Methotrexate Sodium (50MG /2ML Solution, Injection) Active. Celecoxib (200MG  Capsule, Oral) Active. Benicar HCT (40-25MG  Tablet, Oral) Active. Medications Reconciled Aspirin (81MG  Tablet DR, Oral) Active.    Vitals Vanessa Collins; 07/15/2018 3:14 PM) 07/15/2018 3:13 PM Weight: 159.25 lb Collins: 63in Body Surface Area: 1.76 m Body Mass Index: 28.21 kg/m  Temp.: 98.79F(Oral)  Pulse: 67 (Regular)  BP: 132/72 (Sitting, Left Arm, Standard)  BP 108/61   Pulse 64   Temp 98.4 F (36.9 C) (Oral)   Resp 16   Ht 5' 3.5" (1.613 m)   Wt 70.8 kg   SpO2 100%   BMI 27.20 kg/m     Physical Exam Ardeth Sportsman MD; 07/15/2018 4:07 PM)  General Mental Status-Alert. General Appearance-Not in acute distress, Not  Sickly. Orientation-Oriented X3. Hydration-Well hydrated. Voice-Normal.  Integumentary Global Assessment  Upon inspection and palpation of skin surfaces of the - Axillae: non-tender, no inflammation or ulceration, no drainage. and Distribution of scalp and body hair is normal. General Characteristics Temperature - normal warmth is noted.  Head and Neck Head-normocephalic, atraumatic with no lesions or palpable masses. Face Global Assessment - atraumatic, no absence of expression. Neck Global Assessment - no abnormal movements, no bruit auscultated on the right, no bruit auscultated on the left, no decreased range of motion, non-tender. Trachea-midline. Thyroid Gland Characteristics - non-tender.  Eye Eyeball - Left-Extraocular movements intact, No Nystagmus. Eyeball - Right-Extraocular movements intact, No Nystagmus. Cornea - Left-No Hazy. Cornea - Right-No Hazy. Sclera/Conjunctiva - Left-No scleral icterus, No Discharge. Sclera/Conjunctiva - Right-No scleral icterus, No Discharge. Pupil - Left-Direct reaction to light normal. Pupil - Right-Direct reaction to light normal.  ENMT Ears Pinna - Left - no drainage observed, no generalized tenderness observed. Right - no drainage observed, no generalized tenderness observed. Nose and Sinuses External Inspection of the Nose - no destructive lesion observed. Inspection of the nares - Left - quiet respiration. Right - quiet respiration. Mouth and Throat Lips - Upper Lip - no fissures observed, no pallor noted. Lower Lip - no fissures observed, no pallor noted. Nasopharynx - no discharge present. Oral Cavity/Oropharynx - Tongue - no dryness observed. Oral Mucosa - no cyanosis observed. Hypopharynx - no evidence of airway distress observed.  Chest and Lung Exam Inspection Movements - Normal and Symmetrical. Accessory muscles - No use of accessory muscles in breathing. Palpation Palpation of the chest  reveals - Non-tender. Auscultation Breath sounds - Normal and Clear.  Cardiovascular Auscultation Rhythm - Regular. Murmurs & Other Heart Sounds - Auscultation of the heart reveals - No Murmurs and No Systolic Clicks.  Abdomen Inspection Inspection of the abdomen reveals - No Visible peristalsis and No Abnormal pulsations. Umbilicus - No Bleeding, No Urine drainage. Palpation/Percussion Palpation and Percussion of the abdomen reveal - Soft, Non Tender, No Rebound tenderness, No Rigidity (guarding) and No Cutaneous hyperesthesia. Note: Colostomy left infraumbilical. Pink without any obvious hernia. Low midline incision closed without hernia Abdomen soft. Not severely distended. No distasis recti. No umbilical or other anterior abdominal wall hernias  Female Genitourinary Sexual Maturity Tanner 5 - Adult hair pattern. Note: No vaginal bleeding nor discharge  Peripheral Vascular Upper Extremity Inspection - Left - No Cyanotic nailbeds, Not Ischemic. Right - No Cyanotic nailbeds, Not Ischemic.  Neurologic Neurologic evaluation reveals -normal attention span and ability to concentrate, able to name objects and repeat phrases. Appropriate fund of knowledge , normal sensation and normal coordination. Mental Status Affect - not angry, not paranoid. Cranial Nerves-Normal Bilaterally. Gait-Normal.  Neuropsychiatric Mental status exam performed with findings of-able to articulate well with normal speech/language, rate, volume and coherence, thought content normal with ability to perform basic computations and apply abstract reasoning and no evidence of hallucinations, delusions, obsessions or homicidal/suicidal ideation.  Musculoskeletal Global Assessment Spine, Ribs and Pelvis - no instability, subluxation or laxity. Right Upper Extremity - no instability, subluxation or laxity.  Lymphatic Head & Neck  General Head & Neck Lymphatics: Bilateral - Description - No  Localized lymphadenopathy. Axillary  General Axillary Region: Bilateral - Description - No Localized lymphadenopathy. Femoral & Inguinal  Generalized Femoral & Inguinal Lymphatics: Left - Description - No Localized lymphadenopathy. Right - Description - No Localized lymphadenopathy.    Assessment & Plan Elease Hashimoto Spillers CMA; 07/15/2018 3:53 PM)  PREOP COLON - ENCOUNTER FOR PREOPERATIVE EXAMINATION FOR GENERAL SURGICAL PROCEDURE (L24.401) Impression: Colostomy  takedown.  Colonoscopy beforehand.  The anatomy & physiology of the digestive tract was discussed. The pathophysiology of the colon was discussed. Natural history risks without surgery was discussed. I feel the risks of no intervention will lead to serious problems that outweigh the operative risks; therefore, I recommended a partial colectomy to remove the pathology. Minimally invasive (Robotic/Laparoscopic) & open techniques were discussed.  Risks such as bleeding, infection, abscess, leak, reoperation, possible ostomy, hernia, heart attack, death, and other risks were discussed. I noted a good likelihood this will help address the problem. Goals of post-operative recovery were discussed as well. Need for adequate nutrition, daily bowel regimen and healthy physical activity, to optimize recovery was noted as well. We will work to minimize complications. Educational materials were available as well. Questions were answered. The patient expresses understanding & wishes to proceed with surgery.   PERFORATION OF SIGMOID COLON DUE TO DIVERTICULITIS (K57.20) Impression: Recovered status post emergent open Hartmann resection for perforated colon. Pathology consistent with diverticulitis. That was in October 2018.  It is reasonable to proceed with colostomy takedown. Her immunosuppression has been minimize to just 2.5 mg of prednisone a day.  She has great exercise tolerance.  Saw her cardiologist, Dr. Eldridge Dace.   Low risk.   Proceed with minimally invasive colostomy takedown. Robotically-assisted versus laparoscopically.  She would like to be able to go to her brother's birthday in [place] in early October. Therefore, after if there is not enough time.  Current Plans Pt Education - [facility] Diverticular Disease (AT)  Ardeth Sportsman, MD, FACS, MASCRS Gastrointestinal and Minimally Invasive Surgery    [address], [address] [place], [place] [address] [phone] Main / Paging [phone] Fax

## 2018-10-09 NOTE — Transfer of Care (Signed)
Immediate Anesthesia Transfer of Care Note  Patient: Vanessa Collins  Procedure(s) Performed: XI ROBOTIC COLOSTOMY TAKEDOWN, LOWER ANTERIOR RESECTION, LYSIS OF ADHESIONS,PRIMARY INCISIONAL HERNIA REPAIR  ERAS PATHWAY (N/A Abdomen) RIDGID PROCTOSCOPY (N/A )  Patient Location: PACU  Anesthesia Type:General  Level of Consciousness: awake, alert  and oriented  Airway & Oxygen Therapy: Patient Spontanous Breathing and Patient connected to face mask oxygen  Post-op Assessment: Report given to RN and Post -op Vital signs reviewed and stable  Post vital signs: Reviewed and stable  Last Vitals:  Vitals Value Taken Time  BP 100/89 10/09/2018 12:53 PM  Temp    Pulse 85 10/09/2018 12:55 PM  Resp 10 10/09/2018 12:55 PM  SpO2 100 % 10/09/2018 12:55 PM  Vitals shown include unvalidated device data.  Last Pain:  Vitals:   10/09/18 0715  TempSrc:   PainSc: 7          Complications: No apparent anesthesia complications

## 2018-10-09 NOTE — Interval H&P Note (Signed)
History and Physical Interval Note:  10/09/2018 7:23 AM  Vanessa Collins  has presented today for surgery, with the diagnosis of Desire for takedown of colostomy  The various methods of treatment have been discussed with the patient and family. After consideration of risks, benefits and other options for treatment, the patient has consented to  Procedure(s): XI ROBOTIC COLOSTOMY TAKEDOWN  ERAS PATHWAY (N/A) LAPAROSCOPIC POSSIBLE OPEN LYSIS OF ADHESIONS (N/A) RIDGID PROCTOSCOPY (N/A) as a surgical intervention .  The patient's history has been reviewed, patient examined, no change in status, stable for surgery.  I have reviewed the patient's chart and labs.  Questions were answered to the patient's satisfaction.    I have re-reviewed the the patient's records, history, medications, and allergies.  I have re-examined the patient.  I again discussed intraoperative plans and goals of post-operative recovery.  The patient agrees to proceed.  Vanessa Collins  Jun 14, 1946 [id number]  Patient Care Team: Clovis Riley, L.August Saucer, MD as PCP - General (Family Medicine) Karie Soda, MD as Consulting Physician (General Surgery) Rinaldo Cloud, MD as Consulting Physician (Cardiology) Pollyann Savoy, MD as Consulting Physician (Rheumatology) Kerin Salen, MD as Consulting Physician (Gastroenterology)  Patient Active Problem List   Diagnosis Date Noted  . History of depression 06/19/2017  . Rheumatoid arthritis involving multiple sites with positive rheumatoid factor (HCC) 01/25/2017  . High risk medication use 01/25/2017  . Immunosuppression (HCC) 01/25/2017  . Fibromyalgia 01/25/2017  . Primary osteoarthritis of both hands 01/25/2017  . Spondylosis of lumbar region without myelopathy or radiculopathy 01/25/2017  . DJD (degenerative joint disease), cervical 01/25/2017  . History of neutropenia 01/25/2017  . History of coronary artery disease 01/25/2017  . History of hypertension 01/25/2017  . 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 Wears dentures     Past Medical History:  Diagnosis Date  . Arthritis    rheumatoid , osteo   . Diverticulitis    with perforation and colostomy placement    . Fibromyalgia   . Heart disease   . Heart murmur   . Hyperlipidemia   . Hypertension   . Vitamin D deficiency   . Wears dentures    gum disease    Past Surgical History:  Procedure Laterality Date  . CESAREAN SECTION    . COLECTOMY WITH COLOSTOMY CREATION/HARTMANN PROCEDURE  09/2017   Perforated diverticulitis  . CORONARY STENT PLACEMENT  08/2009  . EYE SURGERY    . LAPAROSCOPIC GASTRIC BANDING  04/05/2009   Dr Daphine Deutscher  . VAGINAL HYSTERECTOMY      Social History   Socioeconomic History  . Marital status: Married    Spouse name: Not on file  . Number of children: Not on file  . Years of education: Not on file  . Highest education level: Not on file  Occupational History  . Not on file  Social Needs  . Financial resource strain: Not on file  . Food insecurity:    Worry: Not on file    Inability: Not on file  . Transportation needs:    Medical: Not on file    Non-medical: Not on file  Tobacco Use  . Smoking status: Former Games developer  . Smokeless tobacco: Never Used  Substance and Sexual Activity  . Alcohol use: No  . Drug use: No  . Sexual activity: Not on file  Lifestyle  . Physical activity:    Days per week: Not on file    Minutes per session: Not on file  . Stress: Not  on file  Relationships  . Social connections:    Talks on phone: Not on file    Gets together: Not on file    Attends religious service: Not on file    Active member of club or organization: Not on file    Attends meetings of clubs or organizations: Not on file    Relationship status: Not on file  . Intimate partner violence:    Fear of current or ex partner: Not on file    Emotionally abused: Not on file    Physically abused: Not on file    Forced sexual activity: Not on file  Other Topics Concern  .  Not on file  Social History Narrative  . Not on file    Family History  Problem Relation Age of Onset  . Macular degeneration Mother   . Cancer Father   . Rheum arthritis Father   . Bipolar disorder Father   . Bipolar disorder Daughter   . Rheum arthritis Daughter     Medications Prior to Admission  Medication Sig Dispense Refill Last Dose  . ALPRAZolam (XANAX) 0.5 MG tablet Take 1 mg by mouth at bedtime as needed for anxiety.    10/07/2018 at Unknown time  . aspirin 81 MG tablet Take 81 mg by mouth daily.   10/07/2018 at Unknown time  . celecoxib (CELEBREX) 200 MG capsule TAKE 1 CAPSULE BY MOUTH EVERY DAY WITH FOOD (Patient taking differently: Take 200 mg by mouth every evening. ) 30 capsule 2 10/07/2018 at Unknown time  . cetirizine (ZYRTEC) 10 MG tablet Take 10 mg by mouth daily.   10/09/2018 at 0400  . Cholecalciferol (VITAMIN D3) 5000 units CAPS Take 1 capsule by mouth daily.    10/07/2018 at Unknown time  . estropipate (OGEN) 0.75 MG tablet Take 0.75 mg by mouth daily.  0 10/07/2018 at Unknown time  . famotidine (PEPCID) 20 MG tablet Take 20 mg by mouth 2 (two) times daily.   10/09/2018 at 0400  . folic acid (FOLVITE) 1 MG tablet Take 2 tablets (2 mg total) daily by mouth. 180 tablet 3 10/07/2018 at Unknown time  . methotrexate 50 MG/2ML injection Inject 0.6 mL into the skin once weekly 8 mL 0 10/04/2018 at Unknown time  . metoprolol succinate (TOPROL-XL) 50 MG 24 hr tablet Take 25 mg by mouth daily. Take with or immediately following a meal.    10/09/2018 at 0400  . nitroGLYCERIN (NITROSTAT) 0.4 MG SL tablet Place 0.4 mg under the tongue as needed.   Has never used  . olmesartan-hydrochlorothiazide (BENICAR HCT) 40-25 MG tablet Take 1 tablet by mouth daily.  3 10/08/2018 at Unknown time  . predniSONE (DELTASONE) 5 MG tablet TAKE 1 TABLET BY MOUTH EVERY DAY OR AS DIRECTED (Patient taking differently: Take 5 mg by mouth every morning. ) 90 tablet 0 10/07/2018 at Unknown time  .  PREMARIN 0.3 MG tablet Take 0.3 mg by mouth daily.  12 10/08/2018 at Unknown time  . sertraline (ZOLOFT) 50 MG tablet Take 50 mg by mouth daily.    10/09/2018 at 0400  . simvastatin (ZOCOR) 80 MG tablet Take 80 mg by mouth daily.   10/09/2018 at 0400  . traMADol (ULTRAM) 50 MG tablet Take 50 mg by mouth as needed for moderate pain.    10/08/2018 at Unknown time  . TUBERCULIN SYR 1CC/27GX1/2" (B-D TB SYRINGE 1CC/27GX1/2") 27G X 1/2" 1 ML MISC Patient to inject methotrexate weekly. 12 each 3 10/04/2018  . diclofenac  sodium (VOLTAREN) 1 % GEL Voltaren Gel 3 grams to 3 large joints upto TID 3 TUBES with 3 refills (Patient not taking: Reported on 06/20/2017) 3 Tube 3 Not Taking at Unknown time  . predniSONE (DELTASONE) 5 MG tablet Take 4 tabs x 4 days, 3 tabs x 4 days, 2 tabs x 4 days, 1 tab x 4 days (Patient not taking: Reported on 10/04/2018) 40 tablet 0 Not Taking at Unknown time    Current Facility-Administered Medications  Medication Dose Route Frequency Provider Last Rate Last Dose  . bisacodyl (DULCOLAX) EC tablet 20 mg  20 mg Oral Once Karie Soda, MD      . cefoTEtan (CEFOTAN) 2 g in sodium chloride 0.9 % 100 mL IVPB  2 g Intravenous On Call to OR Karie Soda, MD      . Chlorhexidine Gluconate Cloth 2 % PADS 6 each  6 each Topical Once Karie Soda, MD       And  . Chlorhexidine Gluconate Cloth 2 % PADS 6 each  6 each Topical Once Karie Soda, MD      . clindamycin (CLEOCIN) 900 mg, gentamicin (GARAMYCIN) 240 mg in sodium chloride 0.9 % 1,000 mL for intraperitoneal lavage   Intraperitoneal To OR Karie Soda, MD      . enoxaparin (LOVENOX) injection 40 mg  40 mg Subcutaneous Once Karie Soda, MD      . lactated ringers infusion   Intravenous Once Beryle Lathe, MD         Allergies  Allergen Reactions  . Aspirin Other (See Comments)    Stomach cramping    BP 108/61   Pulse 64   Temp 98.4 F (36.9 C) (Oral)   Resp 16   Ht 5' 3.5" (1.613 m)   Wt 70.8 kg   SpO2 100%    BMI 27.20 kg/m   Labs: No results found for this or any previous visit (from the past 48 hour(s)).  Imaging / Studies: No results found.   Ardeth Sportsman, M.D., F.A.C.S. Gastrointestinal and Minimally Invasive Surgery [facility] [address] [place], [place] [address] [phone] Main / Paging  10/09/2018 7:24 AM    Ardeth Sportsman

## 2018-10-10 ENCOUNTER — Encounter (HOSPITAL_COMMUNITY): Payer: Self-pay | Admitting: Surgery

## 2018-10-10 ENCOUNTER — Other Ambulatory Visit: Payer: Self-pay

## 2018-10-10 DIAGNOSIS — E785 Hyperlipidemia, unspecified: Secondary | ICD-10-CM | POA: Diagnosis present

## 2018-10-10 DIAGNOSIS — I10 Essential (primary) hypertension: Secondary | ICD-10-CM | POA: Diagnosis present

## 2018-10-10 LAB — BASIC METABOLIC PANEL
ANION GAP: 5 (ref 5–15)
BUN: 14 mg/dL (ref 8–23)
CALCIUM: 8.3 mg/dL — AB (ref 8.9–10.3)
CO2: 33 mmol/L — ABNORMAL HIGH (ref 22–32)
Chloride: 103 mmol/L (ref 98–111)
Creatinine, Ser: 0.86 mg/dL (ref 0.44–1.00)
GLUCOSE: 105 mg/dL — AB (ref 70–99)
POTASSIUM: 3.9 mmol/L (ref 3.5–5.1)
Sodium: 141 mmol/L (ref 135–145)

## 2018-10-10 LAB — CBC
HCT: 28.8 % — ABNORMAL LOW (ref 36.0–46.0)
HEMOGLOBIN: 9.2 g/dL — AB (ref 12.0–15.0)
MCH: 33.2 pg (ref 26.0–34.0)
MCHC: 31.9 g/dL (ref 30.0–36.0)
MCV: 104 fL — ABNORMAL HIGH (ref 80.0–100.0)
NRBC: 0 % (ref 0.0–0.2)
PLATELETS: 187 10*3/uL (ref 150–400)
RBC: 2.77 MIL/uL — AB (ref 3.87–5.11)
RDW: 13.6 % (ref 11.5–15.5)
WBC: 9.1 10*3/uL (ref 4.0–10.5)

## 2018-10-10 LAB — MAGNESIUM: MAGNESIUM: 1.6 mg/dL — AB (ref 1.7–2.4)

## 2018-10-10 MED ORDER — MAGNESIUM SULFATE 2 GM/50ML IV SOLN
2.0000 g | Freq: Once | INTRAVENOUS | Status: AC
  Administered 2018-10-10: 2 g via INTRAVENOUS
  Filled 2018-10-10: qty 50

## 2018-10-10 NOTE — Progress Notes (Signed)
Patients sister Seanna Sisler called stating that RN needs to make a note for the Doctor's to know that they are worried and wants to get explanation about the patients bleeding episode on her incision. Also stating concerns about pain medicine not given right on time, RN explained the reason on the delay and family member understood the situation.

## 2018-10-10 NOTE — Progress Notes (Signed)
On call provider Dr. Romie Levee also advice application of pressured dressing. We will continue to monitor.

## 2018-10-10 NOTE — Progress Notes (Signed)
PREEYA CLECKLEY [id number] 03-09-1946  CARE TEAM:  PCP: Alroy Dust, L.Marlou Sa, MD  Outpatient Care Team: Patient Care Team: [place], Carlean Jews.Marlou Sa, MD as PCP - General (Family Medicine) Michael Boston, MD as Consulting Physician (General Surgery) Charolette Forward, MD as Consulting Physician (Cardiology) Bo Merino, MD as Consulting Physician (Rheumatology) Ronnette Juniper, MD as Consulting Physician (Gastroenterology)  Inpatient Treatment Team: Treatment Team: Attending Provider: Michael Boston, MD; Case Manager: Guadalupe Maple, RN   Problem List:   Principal Problem:   Diverticulitis of rectosigmoid s/p robotic LAR/colostomy takedown 10/09/2018 Active Problems:   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  Scared her.  She seems reassured.  Reinforce dressing as needed.  Remove all of dressing and wicks of postop day 3.  Dysphagia 1 diet.  Advance to soft diet as tolerated.  Bowel regimen.  Anxiolysis.    Control hypertension.  Follow low-dose prednisone for rheumatoid arthritis.  Hold off on more aggressive immunosuppression for now until further out from surgery  Follow off IV  fluids.  Hypomagnesemia - Replace magnesium  -VTE prophylaxis- SCDs, etc  -mobilize as tolerated to help recovery  -f/u pathology  20 minutes spent in review, evaluation, examination, counseling, and coordination of care.  More than 50% of that time was spent in counseling.  I updated the patient's status to the patient and nurse.  Recommendations were made.  Questions were answered.  They expressed understanding & appreciation.   10/10/2018    Subjective: (Chief complaint)  Had bloody drainage from incision when she stood up.  Scared her.  Tolerating liquids.  Afraid to try dysphasia 1 diet.  Trying to avoid using narcotic medications.  Pain mostly controlled but still sore.  Did get up and walk a little bit.  Felt pretty good.  Objective:  Vital signs:  Vitals:   10/09/18 2108 10/10/18 0147 10/10/18 0400 10/10/18 0508  BP: (!) 100/56 (!) 94/55 (!) 103/54 (!) 102/56  Pulse: 68 67 69 66  Resp: '18 18 18 18  ' Temp: 98.1 F (36.7 C) 98.1 F (36.7 C) 97.9 F (36.6 C) 98.4 F (36.9 C)  TempSrc: Oral Oral Oral Oral  SpO2: 98% 100% 100% 100%  Weight:    70.9 kg  Height:           Intake/Output   Yesterday:  10/16 0701 - 10/17 0700 In: 3121.7 [P.O.:680; I.V.:2241.7; IV Piggyback:200] Out: 1350 [Urine:1250; Blood:100] This shift:  Total I/O In: -  Out: 50 [Urine:50]  Bowel function:  Flatus: No  BM:  No  Drain: (No drain)   Physical Exam:  General: Pt awake/alert/oriented x4 in no acute distress Eyes: PERRL, normal EOM.  Sclera clear.  No icterus Neuro: CN II-XII intact w/o focal sensory/motor deficits. Lymph: No head/neck/groin lymphadenopathy Psych:  No delerium/psychosis/paranoia HENT: Normocephalic,  Mucus membranes moist.  No thrush Neck: Supple, No tracheal deviation Chest: No chest wall pain w good excursion CV:  Pulses intact.  Regular rhythm MS: Normal AROM mjr joints.  No obvious deformity  Abdomen: Soft.  Nondistended.  Mildly tender at  incisions only.  No evidence of peritonitis.  No incarcerated hernias. ABD pad over Pfannenstiel incision clean and dry.  No blood or soaking through.  Ext:  No deformity.  No mjr edema.  No cyanosis Skin: No petechiae / purpura  Results:   Labs: Results for orders placed or performed during the hospital encounter of 10/09/18 (from the past 48 hour(s))  Basic metabolic panel     Status: Abnormal   Collection Time: 10/10/18  4:48 AM  Result Value Ref Range   Sodium 141 135 - 145 mmol/L   Potassium 3.9 3.5 - 5.1 mmol/L   Chloride 103 98 - 111 mmol/L   CO2 33 (H) 22 - 32 mmol/L   Glucose, Bld 105 (H) 70 - 99 mg/dL   BUN 14 8 - 23 mg/dL   Creatinine, Ser 0.86 0.44 - 1.00 mg/dL   Calcium 8.3 (L) 8.9 - 10.3 mg/dL   GFR calc non Af Amer >60 >60 mL/min   GFR calc Af Amer >60 >60 mL/min    Comment: (NOTE) The eGFR has been calculated using the CKD EPI equation. This calculation has not been validated in all clinical situations. eGFR's persistently <60 mL/min signify possible Chronic Kidney Disease.    Anion gap 5 5 - 15    Comment: Performed at [facility], [place] [address] [place], [place] [address]  CBC     Status: Abnormal   Collection Time: 10/10/18  4:48 AM  Result Value Ref Range   WBC 9.1 4.0 - 10.5 K/uL   RBC 2.77 (L) 3.87 - 5.11 MIL/uL   Hemoglobin 9.2 (L) 12.0 - 15.0 g/dL   HCT 28.8 (L) 36.0 - 46.0 %   MCV 104.0 (H) 80.0 - 100.0 fL   MCH 33.2 26.0 - 34.0 pg   MCHC 31.9 30.0 - 36.0 g/dL   RDW 13.6 11.5 - 15.5 %   Platelets 187 150 - 400 K/uL   nRBC 0.0 0.0 - 0.2 %    Comment: Performed at [facility], [place] [address], [place], [place] [address]  Magnesium     Status: Abnormal   Collection Time: 10/10/18  4:48 AM  Result Value Ref Range   Magnesium 1.6 (L) 1.7 - 2.4 mg/dL    Comment: Performed at [facility], [place] [address] [place], [place] [address]    Imaging / Studies: No results found.  Medications /  Allergies: per chart  Antibiotics: Anti-infectives (From admission, onward)   Start     Dose/Rate Route Frequency Ordered Stop   10/09/18 2000  cefoTEtan (CEFOTAN) 2 g in sodium chloride 0.9 % 100 mL IVPB     2 g 200 mL/hr over 30 Minutes Intravenous Every 12 hours 10/09/18 1407 10/09/18 2049   10/09/18 1400  neomycin (MYCIFRADIN) tablet 1,000 mg  Status:  Discontinued     1,000 mg Oral 3 times per day 10/09/18 0654 10/09/18 0656   10/09/18 1400  metroNIDAZOLE (FLAGYL) tablet 1,000 mg  Status:  Discontinued     1,000 mg Oral 3 times per day 10/09/18 0654 10/09/18 0656   10/09/18 1215  clindamycin (CLEOCIN) 900 mg, gentamicin (GARAMYCIN) 240 mg in sodium chloride 0.9 % 1,000 mL for intraperitoneal lavage  Status:  Discontinued       As needed 10/09/18 1215 10/09/18 1251   10/09/18 0700  cefoTEtan (CEFOTAN) 2 g in sodium chloride 0.9 % 100 mL IVPB     2 g 200 mL/hr over 30 Minutes Intravenous On call to O.R. 10/09/18 0654 10/09/18 0848   10/09/18 0700  clindamycin (CLEOCIN) 900 mg, gentamicin (GARAMYCIN) 240 mg in sodium chloride 0.9 % 1,000 mL for intraperitoneal lavage  Status:  Discontinued      Intraperitoneal To Surgery 10/09/18 0654 10/09/18 1427        Note: Portions of this report may have been transcribed using voice recognition software. Every effort was made to ensure accuracy; however, inadvertent computerized transcription errors may be present.   Any transcriptional errors that result from this process are unintentional.     Adin Hector, MD, FACS, MASCRS Gastrointestinal and Minimally Invasive Surgery    [address], [place], [place] [address] [phone] Main / Paging [phone] Fax

## 2018-10-10 NOTE — Progress Notes (Signed)
I have reviewed and concur with this student's documentation.   

## 2018-10-11 ENCOUNTER — Other Ambulatory Visit: Payer: Self-pay | Admitting: Rheumatology

## 2018-10-11 MED ORDER — SODIUM CHLORIDE 0.9% FLUSH: 3.0000 mL | INTRAVENOUS | Status: DC | PRN

## 2018-10-11 MED ORDER — HYDROCODONE-ACETAMINOPHEN 5-325 MG PO TABS: 1.0000 | ORAL_TABLET | ORAL | 0 refills | Status: DC | PRN

## 2018-10-11 MED ORDER — BISMUTH SUBSALICYLATE 262 MG/15ML PO SUSP
30.0000 mL | Freq: Once | ORAL | Status: AC
  Administered 2018-10-11: 30 mL via ORAL
  Filled 2018-10-11: qty 236

## 2018-10-11 MED ORDER — SODIUM CHLORIDE 0.9 % IV SOLN: 250.0000 mL | INTRAVENOUS | Status: DC | PRN

## 2018-10-11 MED ORDER — SODIUM CHLORIDE 0.9% FLUSH
3.0000 mL | Freq: Two times a day (BID) | INTRAVENOUS | Status: DC
  Administered 2018-10-11: 3 mL via INTRAVENOUS

## 2018-10-11 NOTE — Discharge Summary (Signed)
Physician Discharge Summary    Patient ID: Vanessa Collins MRN: [id number] DOB/AGE: [birth year]  72 y.o.  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  Postoperatively, the patient gradually mobilized and advanced to a solid diet.  Pain and other symptoms were treated aggressively.    By the time of discharge, the patient was walking well the hallways, eating food, having flatus.  Pain was well-controlled on an oral medications.  Based on meeting discharge criteria and continuing to recover, I felt it was safe for the patient to be discharged from the hospital to further  recover with close followup. Postoperative recommendations were discussed in detail.  They are written as well.  Discharged Condition: good  Disposition:  Follow-up Information    Michael Boston, MD. Schedule an appointment as soon as possible for a visit in 3 week(s).   Specialty:  General Surgery Contact information: [facility] [place] [facility] [place] [address] [phone]           Discharge disposition: 01-Home or Self Care       Discharge Instructions    Call MD for:   Complete by:  As directed    FEVER > 101.5 F  (temperatures < 101.5 F are not significant)   Call MD for:   Complete by:  As directed    FEVER > 101.5 F  (temperatures < 101.5 F are not significant)   Call MD for:  extreme fatigue   Complete by:  As directed    Call MD for:  extreme fatigue   Complete by:  As directed    Call MD for:  persistant dizziness or light-headedness   Complete by:  As directed    Call MD for:  persistant dizziness or light-headedness   Complete by:  As directed    Call MD for:  persistant nausea and vomiting   Complete by:  As directed    Call MD for:  persistant nausea and vomiting   Complete by:  As directed    Call MD for:  redness, tenderness, or signs of infection (pain, swelling, redness, odor or green/yellow discharge around incision site)   Complete by:  As directed    Call MD for:  redness, tenderness, or signs of infection (pain, swelling, redness, odor or green/yellow discharge around incision site)   Complete  by:  As directed    Call MD for:  severe uncontrolled pain   Complete by:  As directed    Call MD for:  severe uncontrolled pain   Complete by:  As directed    Diet - low sodium heart healthy   Complete by:  As directed    Start with a bland diet such as soups, liquids, starchy foods, low fat foods, etc. the first few days at home. Gradually advance to a solid, low-fat, high fiber diet by the end of the first week at home.   Add a fiber  supplement to your diet (Metamucil, etc) If you feel full, bloated, or constipated, stay on a full liquid or pureed/blenderized diet for a few days until you feel better and are no longer constipated.   Diet - low sodium heart healthy   Complete by:  As directed    Start with a bland diet such as soups, liquids, starchy foods, low fat foods, etc. the first few days at home. Gradually advance to a solid, low-fat, high fiber diet by the end of the first week at home.   Add a fiber supplement to your diet (Metamucil, etc) If you feel full, bloated, or constipated, stay on a full liquid or pureed/blenderized diet for a few days until you feel better and are no longer constipated.   Discharge instructions   Complete by:  As directed    See Discharge Instructions If you are not getting better after two weeks or are noticing you are getting worse, contact our office [phone] for further advice.  We may need to adjust your medications, re-evaluate you in the office, send you to the emergency room, or see what other things we can do to help. The clinic staff is available to answer your questions during regular business hours (8:30am-5pm).  Please don't hesitate to call and ask to speak to one of our nurses for clinical concerns.    A surgeon from [facility] is always on call at the hospitals 24 hours/day If you have a medical emergency, go to the nearest emergency room or call 911.   Discharge instructions   Complete by:  As directed    See Discharge Instructions If you are not getting better after two weeks or are noticing you are getting worse, contact our office [phone] for further advice.  We may need to adjust your medications, re-evaluate you in the office, send you to the emergency room, or see what other things we can do to help. The clinic staff is available to answer your questions during regular business hours (8:30am-5pm).  Please don't hesitate to call and ask to  speak to one of our nurses for clinical concerns.    A surgeon from [facility] is always on call at the hospitals 24 hours/day If you have a medical emergency, go to the nearest emergency room or call 911.   Discharge wound care:   Complete by:  As directed    It is good for closed incisions and even open wounds to be washed every day.  Shower every day.  Short baths are fine.  Wash the incisions and wounds clean with soap & water.     You may leave closed incisions open to air if it is dry.   You may cover the incision with clean gauze & replace it after your daily shower for comfort.   If you have skin tapes (Steristrips) or skin glue (Dermabond)  on your incision, leave them in place.  They will fall off on their own like a scab.  You may trim any edges that curl up with clean scissors.    If you have skin staples, set up an appointment for them to be removed in the office in 10 days after surgery.  If you have a drain, wash around the skin exit site with soap & water and place a new dressing of gauze or band aid around the skin every day.  Keep the drain site clean & dry.   Discharge wound care:   Complete by:  As directed    It is good for closed incisions and even open wounds to be washed every day.  Shower every day.  Short baths are fine.  Wash the incisions and wounds clean with soap & water.     You may leave closed incisions open to air if it is dry.   You may cover the incision with clean gauze & replace it after your daily shower for comfort.   If you have skin tapes (Steristrips) or skin glue (Dermabond) on your incision, leave them in place.  They will fall off on their own like a scab.  You may trim any edges that curl up with clean scissors.    If you have skin staples, set up an appointment for them to be removed in the office in 10 days after surgery.  If you have a drain, wash around the skin exit site with soap & water and place a new dressing of gauze or band  aid around the skin every day.  Keep the drain site clean & dry.   Driving Restrictions   Complete by:  As directed    You may drive when: - you are no longer taking narcotic prescription pain medication - you can comfortably wear a seatbelt - you can safely make sudden turns/stops without pain.   Driving Restrictions   Complete by:  As directed    You may drive when: - you are no longer taking narcotic prescription pain medication - you can comfortably wear a seatbelt - you can safely make sudden turns/stops without pain.   Increase activity slowly   Complete by:  As directed    Start light daily activities --- self-care, walking, climbing stairs- beginning the day after surgery.  Gradually increase activities as tolerated.  Control your pain to be active.  Stop when you are tired.  Ideally, walk several times a day, eventually an hour a day.   Most people are back to most day-to-day activities in a few weeks.  It takes 4-6 weeks to get back to unrestricted, intense activity. If you can walk 30 minutes without difficulty, it is safe to try more intense activity such as jogging, treadmill, bicycling, low-impact aerobics, swimming, etc. Save the most intensive and strenuous activity for last (Usually 4-8 weeks after surgery) such as sit-ups, heavy lifting, contact sports, etc.  Refrain from any intense heavy lifting or straining until you are off narcotics for pain control.  You will have off days, but things should improve week-by-week. DO NOT PUSH THROUGH PAIN.  Let pain be your guide: If it hurts to do something, don't do it.   Increase activity slowly   Complete by:  As directed    Start light daily activities --- self-care, walking, climbing stairs- beginning the day after surgery.  Gradually increase activities as tolerated.  Control your pain to be active.  Stop when  you are tired.  Ideally, walk several times a day, eventually an hour a day.   Most people are back to most day-to-day  activities in a few weeks.  It takes 4-6 weeks to get back to unrestricted, intense activity. If you can walk 30 minutes without difficulty, it is safe to try more intense activity such as jogging, treadmill, bicycling, low-impact aerobics, swimming, etc. Save the most intensive and strenuous activity for last (Usually 4-8 weeks after surgery) such as sit-ups, heavy lifting, contact sports, etc.  Refrain from any intense heavy lifting or straining until you are off narcotics for pain control.  You will have off days, but things should improve week-by-week. DO NOT PUSH THROUGH PAIN.  Let pain be your guide: If it hurts to do something, don't do it.   Lifting restrictions   Complete by:  As directed    If you can walk 30 minutes without difficulty, it is safe to try more intense activity such as jogging, treadmill, bicycling, low-impact aerobics, swimming, etc. Save the most intensive and strenuous activity for last (Usually 4-8 weeks after surgery) such as sit-ups, heavy lifting, contact sports, etc.   Refrain from any intense heavy lifting or straining until you are off narcotics for pain control.  You will have off days, but things should improve week-by-week. DO NOT PUSH THROUGH PAIN.  Let pain be your guide: If it hurts to do something, don't do it.  Pain is your body warning you to avoid that activity for another week until the pain goes down.   Lifting restrictions   Complete by:  As directed    If you can walk 30 minutes without difficulty, it is safe to try more intense activity such as jogging, treadmill, bicycling, low-impact aerobics, swimming, etc. Save the most intensive and strenuous activity for last (Usually 4-8 weeks after surgery) such as sit-ups, heavy lifting, contact sports, etc.   Refrain from any intense heavy lifting or straining until you are off narcotics for pain control.  You will have off days, but things should improve week-by-week. DO NOT PUSH THROUGH PAIN.  Let pain be  your guide: If it hurts to do something, don't do it.  Pain is your body warning you to avoid that activity for another week until the pain goes down.   May shower / Bathe   Complete by:  As directed    May shower / Bathe   Complete by:  As directed    May walk up steps   Complete by:  As directed    May walk up steps   Complete by:  As directed    Sexual Activity Restrictions   Complete by:  As directed    You may have sexual intercourse when it is comfortable. If it hurts to do something, stop.   Sexual Activity Restrictions   Complete by:  As directed    You may have sexual intercourse when it is comfortable. If it hurts to do something, stop.      Allergies as of 10/11/2018      Reactions   Aspirin Other (See Comments)   Stomach cramping      Medication List    TAKE these medications   ALPRAZolam 0.5 MG tablet Commonly known as:  XANAX Take 1 mg by mouth at bedtime as needed for anxiety.   aspirin 81 MG tablet Take 81 mg by mouth daily.   celecoxib 200 MG capsule Commonly known as:  CELEBREX TAKE 1 CAPSULE  BY MOUTH EVERY DAY WITH FOOD What changed:  See the new instructions.   cetirizine 10 MG tablet Commonly known as:  ZYRTEC Take 10 mg by mouth daily.   diclofenac sodium 1 % Gel Commonly known as:  VOLTAREN Voltaren Gel 3 grams to 3 large joints upto TID 3 TUBES with 3 refills   estropipate 0.75 MG tablet Commonly known as:  OGEN Take 0.75 mg by mouth daily.   famotidine 20 MG tablet Commonly known as:  PEPCID Take 20 mg by mouth 2 (two) times daily.   folic acid 1 MG tablet Commonly known as:  FOLVITE Take 2 tablets (2 mg total) daily by mouth.   methotrexate 50 MG/2ML injection Inject 0.6 mL into the skin once weekly   metoprolol succinate 50 MG 24 hr tablet Commonly known as:  TOPROL-XL Take 25 mg by mouth daily. Take with or immediately following a meal.   nitroGLYCERIN 0.4 MG SL tablet Commonly known as:  NITROSTAT Place 0.4 mg under the  tongue as needed.   olmesartan-hydrochlorothiazide 40-25 MG tablet Commonly known as:  BENICAR HCT Take 1 tablet by mouth daily.   predniSONE 5 MG tablet Commonly known as:  DELTASONE TAKE 1 TABLET BY MOUTH EVERY DAY OR AS DIRECTED What changed:    See the new instructions.  Another medication with the same name was removed. Continue taking this medication, and follow the directions you see here.   PREMARIN 0.3 MG tablet Generic drug:  estrogens (conjugated) Take 0.3 mg by mouth daily.   sertraline 50 MG tablet Commonly known as:  ZOLOFT Take 50 mg by mouth daily.   simvastatin 80 MG tablet Commonly known as:  ZOCOR Take 80 mg by mouth daily.   traMADol 50 MG tablet Commonly known as:  ULTRAM Take 50 mg by mouth as needed for moderate pain. What changed:  Another medication with the same name was added. Make sure you understand how and when to take each.   traMADol 50 MG tablet Commonly known as:  ULTRAM Take 1-2 tablets (50-100 mg total) by mouth every 6 (six) hours as needed for moderate pain or severe pain. What changed:  You were already taking a medication with the same name, and this prescription was added. Make sure you understand how and when to take each.   TUBERCULIN SYR 1CC/27GX1/2" 27G X 1/2" 1 ML Misc Patient to inject methotrexate weekly.   Vitamin D3 5000 units Caps Take 1 capsule by mouth daily.            Discharge Care Instructions  (From admission, onward)         Start     Ordered   10/11/18 0000  Discharge wound care:    Comments:  It is good for closed incisions and even open wounds to be washed every day.  Shower every day.  Short baths are fine.  Wash the incisions and wounds clean with soap & water.     You may leave closed incisions open to air if it is dry.   You may cover the incision with clean gauze & replace it after your daily shower for comfort.   If you have skin tapes (Steristrips) or skin glue (Dermabond) on your  incision, leave them in place.  They will fall off on their own like a scab.  You may trim any edges that curl up with clean scissors.    If you have skin staples, set up an appointment for them to be  removed in the office in 10 days after surgery.  If you have a drain, wash around the skin exit site with soap & water and place a new dressing of gauze or band aid around the skin every day.  Keep the drain site clean & dry.   10/11/18 0740   10/11/18 0000  Discharge wound care:    Comments:  It is good for closed incisions and even open wounds to be washed every day.  Shower every day.  Short baths are fine.  Wash the incisions and wounds clean with soap & water.     You may leave closed incisions open to air if it is dry.   You may cover the incision with clean gauze & replace it after your daily shower for comfort.   If you have skin tapes (Steristrips) or skin glue (Dermabond) on your incision, leave them in place.  They will fall off on their own like a scab.  You may trim any edges that curl up with clean scissors.    If you have skin staples, set up an appointment for them to be removed in the office in 10 days after surgery.  If you have a drain, wash around the skin exit site with soap & water and place a new dressing of gauze or band aid around the skin every day.  Keep the drain site clean & dry.   10/11/18 0745          Significant Diagnostic Studies:  Results for orders placed or performed during the hospital encounter of 10/09/18 (from the past 72 hour(s))  Basic metabolic panel     Status: Abnormal   Collection Time: 10/10/18  4:48 AM  Result Value Ref Range   Sodium 141 135 - 145 mmol/L   Potassium 3.9 3.5 - 5.1 mmol/L   Chloride 103 98 - 111 mmol/L   CO2 33 (H) 22 - 32 mmol/L   Glucose, Bld 105 (H) 70 - 99 mg/dL   BUN 14 8 - 23 mg/dL   Creatinine, Ser 0.86 0.44 - 1.00 mg/dL   Calcium 8.3 (L) 8.9 - 10.3 mg/dL   GFR calc non Af Amer >60 >60 mL/min   GFR calc Af Amer >60  >60 mL/min    Comment: (NOTE) The eGFR has been calculated using the CKD EPI equation. This calculation has not been validated in all clinical situations. eGFR's persistently <60 mL/min signify possible Chronic Kidney Disease.    Anion gap 5 5 - 15    Comment: Performed at [facility], [place] [address] [place], [place] [address]  CBC     Status: Abnormal   Collection Time: 10/10/18  4:48 AM  Result Value Ref Range   WBC 9.1 4.0 - 10.5 K/uL   RBC 2.77 (L) 3.87 - 5.11 MIL/uL   Hemoglobin 9.2 (L) 12.0 - 15.0 g/dL   HCT 28.8 (L) 36.0 - 46.0 %   MCV 104.0 (H) 80.0 - 100.0 fL   MCH 33.2 26.0 - 34.0 pg   MCHC 31.9 30.0 - 36.0 g/dL   RDW 13.6 11.5 - 15.5 %   Platelets 187 150 - 400 K/uL   nRBC 0.0 0.0 - 0.2 %    Comment: Performed at [facility], [place] [address], [place], [place] [address]  Magnesium     Status: Abnormal   Collection Time: 10/10/18  4:48 AM  Result Value Ref Range   Magnesium 1.6 (L) 1.7 - 2.4 mg/dL  Comment: Performed at [facility], [place] [address], [place], [place] [address]    No results found.  Discharge Exam: Blood pressure 112/67, pulse 73, temperature 97.8 F (36.6 C), temperature source Oral, resp. rate 16, height 5' 3.5" (1.613 m), weight 73.1 kg, SpO2 99 %.  General: Pt awake/alert/oriented x4 in No acute distress Eyes: PERRL, normal EOM.  Sclera clear.  No icterus Neuro: CN II-XII intact w/o focal sensory/motor deficits. Lymph: No head/neck/groin lymphadenopathy Psych:  No delerium/psychosis/paranoia HENT: Normocephalic, Mucus membranes moist.  No thrush Neck: Supple, No tracheal deviation Chest: No chest wall pain w good excursion CV:  Pulses intact.  Regular rhythm MS: Normal AROM mjr joints.  No obvious deformity Abdomen: Soft.  Nondistended.  Mildly tender at incisions only.  No evidence of peritonitis.  No incarcerated hernias. Ext:  SCDs BLE.  No mjr edema.  No cyanosis Skin: No  petechiae / purpura  Past Medical History:  Diagnosis Date  . Arthritis    rheumatoid , osteo   . Diverticulitis    with perforation and colostomy placement    . Fibromyalgia   . Heart disease   . Heart murmur   . Hyperlipidemia   . Hypertension   . Vitamin D deficiency   . Wears dentures    gum disease    Past Surgical History:  Procedure Laterality Date  . CESAREAN SECTION    . COLECTOMY WITH COLOSTOMY CREATION/HARTMANN PROCEDURE  09/2017   Perforated diverticulitis  . CORONARY STENT PLACEMENT  08/2009  . EYE SURGERY    . LAPAROSCOPIC GASTRIC BANDING  04/05/2009   Dr Hassell Done  . PROCTOSCOPY N/A 10/09/2018   Procedure: RIDGID PROCTOSCOPY;  Surgeon: Michael Boston, MD;  Location: [facility];  Service: General;  Laterality: N/A;  . VAGINAL HYSTERECTOMY      Social History   Socioeconomic History  . Marital status: Married    Spouse name: Not on file  . Number of children: Not on file  . Years of education: Not on file  . Highest education level: Not on file  Occupational History  . Not on file  Social Needs  . Financial resource strain: Not on file  . Food insecurity:    Worry: Not on file    Inability: Not on file  . Transportation needs:    Medical: Not on file    Non-medical: Not on file  Tobacco Use  . Smoking status: Former Research scientist (life sciences)  . Smokeless tobacco: Never Used  Substance and Sexual Activity  . Alcohol use: No  . Drug use: No  . Sexual activity: Not on file  Lifestyle  . Physical activity:    Days per week: Not on file    Minutes per session: Not on file  . Stress: Not on file  Relationships  . Social connections:    Talks on phone: Not on file    Gets together: Not on file    Attends religious service: Not on file    Active member of club or organization: Not on file    Attends meetings of clubs or organizations: Not on file    Relationship status: Not on file  . Intimate partner violence:    Fear of current or ex partner: Not on file     Emotionally abused: Not on file    Physically abused: Not on file    Forced sexual activity: Not on file  Other Topics Concern  . Not on file  Social History Narrative  . Not  on file    Family History  Problem Relation Age of Onset  . Macular degeneration Mother   . Cancer Father   . Rheum arthritis Father   . Bipolar disorder Father   . Bipolar disorder Daughter   . Rheum arthritis Daughter     Current Facility-Administered Medications  Medication Dose Route Frequency Provider Last Rate Last Dose  . 0.9 %  sodium chloride infusion  1,000 mL Intravenous Q8H PRN Michael Boston, MD      . 0.9 %  sodium chloride infusion  250 mL Intravenous PRN Michael Boston, MD      . acetaminophen (TYLENOL) tablet 1,000 mg  1,000 mg Oral Lajuana Ripple, MD   1,000 mg at 10/11/18 0618  . ALPRAZolam (XANAX) tablet 1 mg  1 mg Oral QHS PRN Michael Boston, MD      . alum & mag hydroxide-simeth (MAALOX/MYLANTA) 200-200-20 MG/5ML suspension 30 mL  30 mL Oral Q6H PRN Michael Boston, MD      . alvimopan (ENTEREG) capsule 12 mg  12 mg Oral BID Michael Boston, MD      . aspirin EC tablet 81 mg  81 mg Oral Daily Michael Boston, MD   81 mg at 10/10/18 1100  . cholecalciferol (VITAMIN D) tablet 5,000 Units  5,000 Units Oral Daily Michael Boston, MD   5,000 Units at 10/10/18 1100  . diphenhydrAMINE (BENADRYL) 12.5 MG/5ML elixir 12.5 mg  12.5 mg Oral Q6H PRN Michael Boston, MD       Or  . diphenhydrAMINE (BENADRYL) injection 12.5 mg  12.5 mg Intravenous Q6H PRN Michael Boston, MD      . enoxaparin (LOVENOX) injection 40 mg  40 mg Subcutaneous Q24H Michael Boston, MD      . estrogens (conjugated) (PREMARIN) tablet 0.3 mg  0.3 mg Oral Daily Michael Boston, MD   0.3 mg at 10/10/18 1000  . famotidine (PEPCID) tablet 20 mg  20 mg Oral BID Michael Boston, MD   20 mg at 10/10/18 2200  . feeding supplement (ENSURE SURGERY) liquid 237 mL  237 mL Oral BID BM Michael Boston, MD   237 mL at 10/10/18 1400  . folic acid (FOLVITE) tablet  2 mg  2 mg Oral Daily Michael Boston, MD   2 mg at 10/10/18 1100  . gabapentin (NEURONTIN) capsule 300 mg  300 mg Oral BID Michael Boston, MD   300 mg at 10/10/18 2159  . hydrALAZINE (APRESOLINE) injection 10 mg  10 mg Intravenous Q2H PRN Michael Boston, MD      . irbesartan (AVAPRO) tablet 300 mg  300 mg Oral Daily Michael Boston, MD   300 mg at 10/10/18 1100   And  . hydrochlorothiazide (HYDRODIURIL) tablet 25 mg  25 mg Oral Daily Michael Boston, MD   25 mg at 10/10/18 1100  . HYDROmorphone (DILAUDID) injection 0.5-2 mg  0.5-2 mg Intravenous D4Y PRN Leighton Ruff, MD   1 mg at 81/44/81 2159  . loratadine (CLARITIN) tablet 10 mg  10 mg Oral Daily Michael Boston, MD   10 mg at 10/10/18 1100  . metoCLOPramide (REGLAN) injection 10 mg  10 mg Intravenous Q6H PRN Michael Boston, MD      . metoprolol succinate (TOPROL-XL) 24 hr tablet 25 mg  25 mg Oral Daily Michael Boston, MD   25 mg at 10/10/18 1100  . metoprolol tartrate (LOPRESSOR) injection 5 mg  5 mg Intravenous Q6H PRN Michael Boston, MD      . nitroGLYCERIN (NITROSTAT) SL tablet  0.4 mg  0.4 mg Sublingual Q5 min PRN Michael Boston, MD      . ondansetron [facility]) tablet 4 mg  4 mg Oral Q6H PRN Michael Boston, MD       Or  . ondansetron [facility]) injection 4 mg  4 mg Intravenous Q6H PRN Michael Boston, MD      . predniSONE (DELTASONE) tablet 5 mg  5 mg Oral Normajean Glasgow, MD   5 mg at 10/11/18 3338  . prochlorperazine (COMPAZINE) tablet 10 mg  10 mg Oral Q6H PRN Michael Boston, MD       Or  . prochlorperazine (COMPAZINE) injection 5-10 mg  5-10 mg Intravenous Q6H PRN Michael Boston, MD      . saccharomyces boulardii (FLORASTOR) capsule 250 mg  250 mg Oral BID Michael Boston, MD   250 mg at 10/10/18 2159  . sertraline (ZOLOFT) tablet 50 mg  50 mg Oral Daily Michael Boston, MD   50 mg at 10/10/18 1100  . sodium chloride flush (NS) 0.9 % injection 3 mL  3 mL Intravenous Gorden Harms, MD      . sodium chloride flush (NS) 0.9 % injection 3 mL  3 mL  Intravenous PRN Michael Boston, MD      . traMADol Veatrice Bourbon) tablet 50-100 mg  50-100 mg Oral Q6H PRN Michael Boston, MD   100 mg at 10/11/18 0220     Allergies  Allergen Reactions  . Aspirin Other (See Comments)    Stomach cramping    Signed: Morton Peters, MD, FACS, MASCRS [facility]    [address] [place], [place] [address] [phone] Main / Paging [phone] Fax   10/11/2018, 7:45 AM

## 2018-10-11 NOTE — Progress Notes (Signed)
Discharge and medication instructions reviewed with patient. Questions answered. Patient denies further questions. Patient's spouse is en route to drive patient home. No prescriptions given to patient. Lina Sar, RN

## 2018-10-11 NOTE — Progress Notes (Signed)
Pharmacy Brief Note - Alvimopan (Entereg)  The standing order set for alvimopan (Entereg) now includes an automatic order to discontinue the drug after the patient has had a bowel movement.  The change was approved by the Pharmacy & Therapeutics Committee and the Medical Executive Committee.    This patient has had a bowel movement documented by nursing.  Therefore, alvimopan has been discontinued.  If there are questions, please contact the pharmacy at [phone].  Thank you  Lynann Beaver PharmD, BCPS Pager [phone] 10/11/2018 11:55 AM

## 2018-10-11 NOTE — Discharge Instructions (Signed)
SURGERY: POST OP INSTRUCTIONS °(Surgery for small bowel obstruction, colon resection, etc) ° ° °###################################################################### ° °EAT °Gradually transition to a high fiber diet with a fiber supplement over the next few days after discharge ° °WALK °Walk an hour a day.  Control your pain to do that.   ° °CONTROL PAIN °Control pain so that you can walk, sleep, tolerate sneezing/coughing, go up/down stairs. ° °HAVE A BOWEL MOVEMENT DAILY °Keep your bowels regular to avoid problems.  OK to try a laxative to override constipation.  OK to use an antidairrheal to slow down diarrhea.  Call if not better after 2 tries ° °CALL IF YOU HAVE PROBLEMS/CONCERNS °Call if you are still struggling despite following these instructions. °Call if you have concerns not answered by these instructions ° °###################################################################### ° ° °DIET °Follow a light diet the first few days at home.  Start with a bland diet such as soups, liquids, starchy foods, low fat foods, etc.  If you feel full, bloated, or constipated, stay on a ful liquid or pureed/blenderized diet for a few days until you feel better and no longer constipated. °Be sure to drink plenty of fluids every day to avoid getting dehydrated (feeling dizzy, not urinating, etc.). °Gradually add a fiber supplement to your diet over the next week.  Gradually get back to a regular solid diet.  Avoid fast food or heavy meals the first week as you are more likely to get nauseated. °It is expected for your digestive tract to need a few months to get back to normal.  It is common for your bowel movements and stools to be irregular.  You will have occasional bloating and cramping that should eventually fade away.  Until you are eating solid food normally, off all pain medications, and back to regular activities; your bowels will not be normal. °Focus on eating a low-fat, high fiber diet the rest of your life  (See Getting to Good Bowel Health, below). ° °CARE of your INCISION or WOUND °It is good for closed incision and even open wounds to be washed every day.  Shower every day.  Short baths are fine.  Wash the incisions and wounds clean with soap & water.    °If you have a closed incision(s), wash the incision with soap & water every day.  You may leave closed incisions open to air if it is dry.   You may cover the incision with clean gauze & replace it after your daily shower for comfort. °If you have skin tapes (Steristrips) or skin glue (Dermabond) on your incision, leave them in place.  They will fall off on their own like a scab.  You may trim any edges that curl up with clean scissors.  If you have staples, set up an appointment for them to be removed in the office in 10 days after surgery.  °If you have a drain, wash around the skin exit site with soap & water and place a new dressing of gauze or band aid around the skin every day.  Keep the drain site clean & dry.    °If you have an open wound with packing, see wound care instructions.  In general, it is encouraged that you remove your dressing and packing, shower with soap & water, and replace your dressing once a day.  Pack the wound with clean gauze moistened with normal (0.9%) saline to keep the wound moist & uninfected.  Pressure on the dressing for 30 minutes will stop most wound   bleeding.  Eventually your body will heal & pull the open wound closed over the next few months.  °Raw open wounds will occasionally bleed or secrete yellow drainage until it heals closed.  Drain sites will drain a little until the drain is removed.  Even closed incisions can have mild bleeding or drainage the first few days until the skin edges scab over & seal.   °If you have an open wound with a wound vac, see wound vac care instructions. ° ° ° ° °ACTIVITIES as tolerated °Start light daily activities --- self-care, walking, climbing stairs-- beginning the day after surgery.   Gradually increase activities as tolerated.  Control your pain to be active.  Stop when you are tired.  Ideally, walk several times a day, eventually an hour a day.   °Most people are back to most day-to-day activities in a few weeks.  It takes 4-8 weeks to get back to unrestricted, intense activity. °If you can walk 30 minutes without difficulty, it is safe to try more intense activity such as jogging, treadmill, bicycling, low-impact aerobics, swimming, etc. °Save the most intensive and strenuous activity for last (Usually 4-8 weeks after surgery) such as sit-ups, heavy lifting, contact sports, etc.  Refrain from any intense heavy lifting or straining until you are off narcotics for pain control.  You will have off days, but things should improve week-by-week. °DO NOT PUSH THROUGH PAIN.  Let pain be your guide: If it hurts to do something, don't do it.  Pain is your body warning you to avoid that activity for another week until the pain goes down. °You may drive when you are no longer taking narcotic prescription pain medication, you can comfortably wear a seatbelt, and you can safely make sudden turns/stops to protect yourself without hesitating due to pain. °You may have sexual intercourse when it is comfortable. If it hurts to do something, stop. ° °MEDICATIONS °Take your usually prescribed home medications unless otherwise directed.   °Blood thinners:  °Usually you can restart any strong blood thinners after the second postoperative day.  It is OK to take aspirin right away.    ° If you are on strong blood thinners (warfarin/Coumadin, Plavix, Xerelto, Eliquis, Pradaxa, etc), discuss with your surgeon, medicine PCP, and/or cardiologist for instructions on when to restart the blood thinner & if blood monitoring is needed (PT/INR blood check, etc).   ° ° °PAIN CONTROL °Pain after surgery or related to activity is often due to strain/injury to muscle, tendon, nerves and/or incisions.  This pain is usually  short-term and will improve in a few months.  °To help speed the process of healing and to get back to regular activity more quickly, DO THE FOLLOWING THINGS TOGETHER: °1. Increase activity gradually.  DO NOT PUSH THROUGH PAIN °2. Use Ice and/or Heat °3. Try Gentle Massage and/or Stretching °4. Take over the counter pain medication °5. Take Narcotic prescription pain medication for more severe pain ° °Good pain control = faster recovery.  It is better to take more medicine to be more active than to stay in bed all day to avoid medications. °1.  Increase activity gradually °Avoid heavy lifting at first, then increase to lifting as tolerated over the next 6 weeks. °Do not “push through” the pain.  Listen to your body and avoid positions and maneuvers than reproduce the pain.  Wait a few days before trying something more intense °Walking an hour a day is encouraged to help your body recover faster   and more safely.  Start slowly and stop when getting sore.  If you can walk 30 minutes without stopping or pain, you can try more intense activity (running, jogging, aerobics, cycling, swimming, treadmill, sex, sports, weightlifting, etc.) °Remember: If it hurts to do it, then don’t do it! °2. Use Ice and/or Heat °You will have swelling and bruising around the incisions.  This will take several weeks to resolve. °Ice packs or heating pads (6-8 times a day, 30-60 minutes at a time) will help sooth soreness & bruising. °Some people prefer to use ice alone, heat alone, or alternate between ice & heat.  Experiment and see what works best for you.  Consider trying ice for the first few days to help decrease swelling and bruising; then, switch to heat to help relax sore spots and speed recovery. °Shower every day.  Short baths are fine.  It feels good!  Keep the incisions and wounds clean with soap & water.   °3. Try Gentle Massage and/or Stretching °Massage at the area of pain many times a day °Stop if you feel pain - do not  overdo it °4. Take over the counter pain medication °This helps the muscle and nerve tissues become less irritable and calm down faster °Choose ONE of the following over-the-counter anti-inflammatory medications: °Acetaminophen 500mg tabs (Tylenol) 1-2 pills with every meal and just before bedtime (avoid if you have liver problems or if you have acetaminophen in you narcotic prescription) °Naproxen 220mg tabs (ex. Aleve, Naprosyn) 1-2 pills twice a day (avoid if you have kidney, stomach, IBD, or bleeding problems) °Ibuprofen 200mg tabs (ex. Advil, Motrin) 3-4 pills with every meal and just before bedtime (avoid if you have kidney, stomach, IBD, or bleeding problems) °Take with food/snack several times a day as directed for at least 2 weeks to help keep pain / soreness down & more manageable. °5. Take Narcotic prescription pain medication for more severe pain °A prescription for strong pain control is often given to you upon discharge (for example: oxycodone/Percocet, hydrocodone/Norco/Vicodin, or tramadol/Ultram) °Take your pain medication as prescribed. °Be mindful that most narcotic prescriptions contain Tylenol (acetaminophen) as well - avoid taking too much Tylenol. °If you are having problems/concerns with the prescription medicine (does not control pain, nausea, vomiting, rash, itching, etc.), please call us [phone] to see if we need to switch you to a different pain medicine that will work better for you and/or control your side effects better. °If you need a refill on your pain medication, you must call the office before 4 pm and on weekdays only.  By federal law, prescriptions for narcotics cannot be called into a pharmacy.  They must be filled out on paper & picked up from our office by the patient or authorized caretaker.  Prescriptions cannot be filled after 4 pm nor on weekends.   ° °WHEN TO CALL US [phone] °Severe uncontrolled or worsening pain  °Fever over 101 F (38.5 C) °Concerns with  the incision: Worsening pain, redness, rash/hives, swelling, bleeding, or drainage °Reactions / problems with new medications (itching, rash, hives, nausea, etc.) °Nausea and/or vomiting °Difficulty urinating °Difficulty breathing °Worsening fatigue, dizziness, lightheadedness, blurred vision °Other concerns °If you are not getting better after two weeks or are noticing you are getting worse, contact our office [phone] for further advice.  We may need to adjust your medications, re-evaluate you in the office, send you to the emergency room, or see what other things we can do to help. °The   clinic staff is available to answer your questions during regular business hours (8:30am-5pm).  Please don’t hesitate to call and ask to speak to one of our nurses for clinical concerns.    °A surgeon from [facility] is always on call at the hospitals 24 hours/day °If you have a medical emergency, go to the nearest emergency room or call 911. ° °FOLLOW UP in our office °One the day of your discharge from the hospital (or the next business weekday), please call [facility] to set up or confirm an appointment to see your surgeon in the office for a follow-up appointment.  Usually it is 2-3 weeks after your surgery.   °If you have skin staples at your incision(s), let the office know so we can set up a time in the office for the nurse to remove them (usually around 10 days after surgery). °Make sure that you call for appointments the day of discharge (or the next business weekday) from the hospital to ensure a convenient appointment time. °IF YOU HAVE DISABILITY OR FAMILY LEAVE FORMS, BRING THEM TO THE OFFICE FOR PROCESSING.  DO NOT GIVE THEM TO YOUR DOCTOR. ° °[facility] °[address], [address], [place], [place]  [address] ? °[phone] - Main °[phone] - Toll Free,  [phone] - Fax °[email or web address] ° °GETTING TO GOOD BOWEL HEALTH. °It is  expected for your digestive tract to need a few months to get back to normal.  It is common for your bowel movements and stools to be irregular.  You will have occasional bloating and cramping that should eventually fade away.  Until you are eating solid food normally, off all pain medications, and back to regular activities; your bowels will not be normal.   °Avoiding constipation °The goal: ONE SOFT BOWEL MOVEMENT A DAY!    °Drink plenty of fluids.  Choose water first. °TAKE A FIBER SUPPLEMENT EVERY DAY THE REST OF YOUR LIFE °During your first week back home, gradually add back a fiber supplement every day °Experiment which form you can tolerate.   There are many forms such as powders, tablets, wafers, gummies, etc °Psyllium bran (Metamucil), methylcellulose (Citrucel), Miralax or Glycolax, Benefiber, Flax Seed.  °Adjust the dose week-by-week (1/2 dose/day to 6 doses a day) until you are moving your bowels 1-2 times a day.  Cut back the dose or try a different fiber product if it is giving you problems such as diarrhea or bloating. °Sometimes a laxative is needed to help jump-start bowels if constipated until the fiber supplement can help regulate your bowels.  If you are tolerating eating & you are farting, it is okay to try a gentle laxative such as double dose MiraLax, prune juice, or Milk of Magnesia.  Avoid using laxatives too often. °Stool softeners can sometimes help counteract the constipating effects of narcotic pain medicines.  It can also cause diarrhea, so avoid using for too long. °If you are still constipated despite taking fiber daily, eating solids, and a few doses of laxatives, call our office. °Controlling diarrhea °Try drinking liquids and eating bland foods for a few days to avoid stressing your intestines further. °Avoid dairy products (especially milk & ice cream) for a short time.  The intestines often can lose the ability to digest lactose when stressed. °Avoid foods that cause gassiness or  bloating.  Typical foods include beans and other legumes, cabbage, broccoli, and dairy foods.  Avoid greasy, spicy, fast foods.  Every person has   some sensitivity to other foods, so listen to your body and avoid those foods that trigger problems for you. °Probiotics (such as active yogurt, Align, etc) may help repopulate the intestines and colon with normal bacteria and calm down a sensitive digestive tract °Adding a fiber supplement gradually can help thicken stools by absorbing excess fluid and retrain the intestines to act more normally.  Slowly increase the dose over a few weeks.  Too much fiber too soon can backfire and cause cramping & bloating. °It is okay to try and slow down diarrhea with a few doses of antidiarrheal medicines.   °Bismuth subsalicylate (ex. Kayopectate, Pepto Bismol) for a few doses can help control diarrhea.  Avoid if pregnant.   °Loperamide (Imodium) can slow down diarrhea.  Start with one tablet (2mg) first.  Avoid if you are having fevers or severe pain.  °ILEOSTOMY PATIENTS WILL HAVE CHRONIC DIARRHEA since their colon is not in use.    °Drink plenty of liquids.  You will need to drink even more glasses of water/liquid a day to avoid getting dehydrated. °Record output from your ileostomy.  Expect to empty the bag every 3-4 hours at first.  Most people with a permanent ileostomy empty their bag 4-6 times at the least.   °Use antidiarrheal medicine (especially Imodium) several times a day to avoid getting dehydrated.  Start with a dose at bedtime & breakfast.  Adjust up or down as needed.  Increase antidiarrheal medications as directed to avoid emptying the bag more than 8 times a day (every 3 hours). °Work with your wound ostomy nurse to learn care for your ostomy.  See ostomy care instructions. °TROUBLESHOOTING IRREGULAR BOWELS °1) Start with a soft & bland diet. No spicy, greasy, or fried foods.  °2) Avoid gluten/wheat or dairy products from diet to see if symptoms improve. °3) Miralax  17gm or flax seed mixed in 8oz. water or juice-daily. May use 2-4 times a day as needed. °4) Gas-X, Phazyme, etc. as needed for gas & bloating.  °5) Prilosec (omeprazole) over-the-counter as needed °6)  Consider probiotics (Align, Activa, etc) to help calm the bowels down ° °Call your doctor if you are getting worse or not getting better.  Sometimes further testing (cultures, endoscopy, X-ray studies, CT scans, bloodwork, etc.) may be needed to help diagnose and treat the cause of the diarrhea. °[facility] °[address], [address], [place], [place]  [address] °[phone] - Main.    °[phone]  - Toll Free.   [phone] - Fax °[email or web address] ° ° °Diverticulitis °Diverticulitis is infection or inflammation of small pouches (diverticula) in the colon that form due to a condition called diverticulosis. Diverticula can trap stool (feces) and bacteria, causing infection and inflammation. °Diverticulitis may cause severe stomach pain and diarrhea. It may lead to tissue damage in the colon that causes bleeding. The diverticula may also burst (rupture) and cause infected stool to enter other areas of the abdomen. °Complications of diverticulitis can include: °· Bleeding. °· Severe infection. °· Severe pain. °· Rupture (perforation) of the colon. °· Blockage (obstruction) of the colon. ° °What are the causes? °This condition is caused by stool becoming trapped in the diverticula, which allows bacteria to grow in the diverticula. This leads to inflammation and infection. °What increases the risk? °You are more likely to develop this condition if: °· You have diverticulosis. The risk for diverticulosis increases if: °? You are overweight or obese. °? You use tobacco products. °? You do   not get enough exercise. °· You eat a diet that does not include enough fiber. High-fiber foods include fruits, vegetables, beans, nuts, and whole grains. ° °What are the signs or  symptoms? °Symptoms of this condition may include: °· Pain and tenderness in the abdomen. The pain is normally located on the left side of the abdomen, but it may occur in other areas. °· Fever and chills. °· Bloating. °· Cramping. °· Nausea. °· Vomiting. °· Changes in bowel routines. °· Blood in your stool. ° °How is this diagnosed? °This condition is diagnosed based on: °· Your medical history. °· A physical exam. °· Tests to make sure there is nothing else causing your condition. These tests may include: °? Blood tests. °? Urine tests. °? Imaging tests of the abdomen, including X-rays, ultrasounds, MRIs, or CT scans. ° °How is this treated? °Most cases of this condition are mild and can be treated at home. Treatment may include: °· Taking over-the-counter pain medicines. °· Following a clear liquid diet. °· Taking antibiotic medicines by mouth. °· Rest. ° °More severe cases may need to be treated at a hospital. Treatment may include: °· Not eating or drinking. °· Taking prescription pain medicine. °· Receiving antibiotic medicines through an IV tube. °· Receiving fluids and nutrition through an IV tube. °· Surgery. ° °When your condition is under control, your health care provider may recommend that you have a colonoscopy. This is an exam to look at the entire large intestine. During the exam, a lubricated, bendable tube is inserted into the anus and then passed into the rectum, colon, and other parts of the large intestine. A colonoscopy can show how severe your diverticula are and whether something else may be causing your symptoms. °Follow these instructions at home: °Medicines °· Take over-the-counter and prescription medicines only as told by your health care provider. These include fiber supplements, probiotics, and stool softeners. °· If you were prescribed an antibiotic medicine, take it as told by your health care provider. Do not stop taking the antibiotic even if you start to feel better. °· Do not  drive or use heavy machinery while taking prescription pain medicine. °General instructions °· Follow a full liquid diet or another diet as directed by your health care provider. After your symptoms improve, your health care provider may tell you to change your diet. He or she may recommend that you eat a diet that contains at least 25 g (25 grams) of fiber daily. Fiber makes it easier to pass stool. Healthy sources of fiber include: °? Berries. One cup contains 4-8 grams of fiber. °? Beans or lentils. One half cup contains 5-8 grams of fiber. °? Green vegetables. One cup contains 4 grams of fiber. °· Exercise for at least 30 minutes, 3 times each week. You should exercise hard enough to raise your heart rate and break a sweat. °· Keep all follow-up visits as told by your health care provider. This is important. You may need a colonoscopy. °Contact a health care provider if: °· Your pain does not improve. °· You have a hard time drinking or eating food. °· Your bowel movements do not return to normal. °Get help right away if: °· Your pain gets worse. °· Your symptoms do not get better with treatment. °· Your symptoms suddenly get worse. °· You have a fever. °· You vomit more than one time. °· You have stools that are bloody, black, or tarry. °Summary °· Diverticulitis is infection or inflammation of small pouches (  diverticula) in the colon that form due to a condition called diverticulosis. Diverticula can trap stool (feces) and bacteria, causing infection and inflammation. °· You are at higher risk for this condition if you have diverticulosis and you eat a diet that does not include enough fiber. °· Most cases of this condition are mild and can be treated at home. More severe cases may need to be treated at a hospital. °· When your condition is under control, your health care provider may recommend that you have an exam called a colonoscopy. This exam can show how severe your diverticula are and whether something  else may be causing your symptoms. °This information is not intended to replace advice given to you by your health care provider. Make sure you discuss any questions you have with your health care provider. °Document Released: 09/20/2005 Document Revised: 01/13/2017 Document Reviewed: 01/13/2017 °Elsevier Interactive Patient Education © 2018 [employer] ° °

## 2018-10-11 NOTE — Telephone Encounter (Signed)
Last Visit: 09/12/18 Next Visit: 12/04/18 Labs: 09/12/18 Her potassium is low. Potassium  Rechecked 10/10/18 WNL  Okay to refill per Dr. Corliss Skains

## 2018-10-29 ENCOUNTER — Telehealth (INDEPENDENT_AMBULATORY_CARE_PROVIDER_SITE_OTHER): Payer: Self-pay | Admitting: Rheumatology

## 2018-10-29 ENCOUNTER — Other Ambulatory Visit: Payer: Self-pay | Admitting: Rheumatology

## 2018-10-29 ENCOUNTER — Encounter (HOSPITAL_COMMUNITY): Payer: Self-pay

## 2018-10-29 NOTE — Telephone Encounter (Signed)
Received vm from patient checking status of forms. IC and lmvm advised needs to call Tirsa at [facility] and left ph number for her to call.

## 2018-10-29 NOTE — Telephone Encounter (Signed)
Last Visit: 09/12/18 Next Visit: 12/04/18  Okay to refill per Dr. Deveshwar 

## 2018-10-30 ENCOUNTER — Telehealth: Payer: Self-pay | Admitting: Rheumatology

## 2018-10-30 ENCOUNTER — Encounter: Payer: Self-pay | Admitting: *Deleted

## 2018-10-30 NOTE — Telephone Encounter (Signed)
Patient called stating she was checking on the status of her letter from Dr. Corliss Skains stating that she was unable to fly due to her diagnosis so she could receive a refund from the airlines, as well as her FMLA paperwork.  Patient states her husband dropped off the letter from the claims department and the deadline to submit documentation is 10/30/18.  Patient states she called Tirsa to check on her FMLA paperwork and was told that she never received it.  Patient states she will have her husband call back since he was the one who dropped off the information.

## 2018-10-30 NOTE — Telephone Encounter (Signed)
Yes

## 2018-10-30 NOTE — Telephone Encounter (Signed)
Letter written and placed at front for pick up. Patient's husband advised ready for pick up.

## 2018-10-30 NOTE — Telephone Encounter (Signed)
Information that was left did not state that patient need a letter and there was not a place for Dr. Corliss Skains to sign. When patient contacted the office yesterday she was unaware of what should be in the letter and would need to discuss it with her husband and contact the office.  Attempted to contact the patient to to discuss if she has been able to discuss with her husband what she needs in the letter. Left message for patient to call the office.

## 2018-10-30 NOTE — Telephone Encounter (Addendum)
Spoke with Vanessa Collins,patient's husband,  he states that the letter needs to state that the travelling with the RA would be too difficult for her. Patient was supposed to be travelling to [facility]. Patient was due to be on a flight for 4.5 hours. This is in regard to the airfare reimbursement Can we write this letter for the patient?

## 2018-10-30 NOTE — Telephone Encounter (Signed)
Patient's husband Chrissie Noa left a voicemail stating he was returning your call.

## 2018-11-15 NOTE — Progress Notes (Deleted)
Office Visit Note  Patient: Vanessa Collins             Date of Birth: [birth year]           MRN: [id number]             PCP: Clovis Riley, L.August Saucer, MD Referring: Clovis Riley, Elbert Ewings.August Saucer, MD Visit Date: 11/20/2018 Occupation: @GUAROCC @  Subjective:  No chief complaint on file.   History of Present Illness: Vanessa Collins is a 72 y.o. female ***   Activities of Daily Living:  Patient reports morning stiffness for *** {minute/hour:19697}.   Patient {ACTIONS;DENIES/REPORTS:21021675::"Denies"} nocturnal pain.  Difficulty dressing/grooming: {ACTIONS;DENIES/REPORTS:21021675::"Denies"} Difficulty climbing stairs: {ACTIONS;DENIES/REPORTS:21021675::"Denies"} Difficulty getting out of chair: {ACTIONS;DENIES/REPORTS:21021675::"Denies"} Difficulty using hands for taps, buttons, cutlery, and/or writing: {ACTIONS;DENIES/REPORTS:21021675::"Denies"}  No Rheumatology ROS completed.   PMFS History:  Patient Active Problem List   Diagnosis Date Noted  . Hypertension   . Hyperlipidemia   . Diverticulitis of rectosigmoid s/p robotic LAR/colostomy takedown 10/09/2018 10/09/2018  . 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 History of depression 06/19/2017  . Rheumatoid arthritis involving multiple sites with positive rheumatoid factor (HCC) 01/25/2017  . High risk medication use 01/25/2017  . Immunosuppression (HCC) 01/25/2017  . Fibromyalgia 01/25/2017  . Primary osteoarthritis of both hands 01/25/2017  . Spondylosis of lumbar region without myelopathy or radiculopathy 01/25/2017  . DJD (degenerative joint disease), cervical 01/25/2017  . History of neutropenia 01/25/2017  . History of coronary artery disease 01/25/2017  . 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 Wears dentures     Past Medical History:  Diagnosis Date  . Arthritis    rheumatoid , osteo   . Diverticulitis    with perforation and colostomy placement    . Fibromyalgia   . Heart disease   . Heart murmur    . Hyperlipidemia   . Hypertension   . Vitamin D deficiency   . Wears dentures    gum disease    Family History  Problem Relation Age of Onset  . Macular degeneration Mother   . Cancer Father   . Rheum arthritis Father   . Bipolar disorder Father   . Bipolar disorder Daughter   . Rheum arthritis Daughter    Past Surgical History:  Procedure Laterality Date  . CESAREAN SECTION    . COLECTOMY WITH COLOSTOMY CREATION/HARTMANN PROCEDURE  09/2017   Perforated diverticulitis  . CORONARY STENT PLACEMENT  08/2009  . EYE SURGERY    . LAPAROSCOPIC GASTRIC BANDING  04/05/2009   Dr 06/05/2009  . PROCTOSCOPY N/A 10/09/2018   Procedure: RIDGID PROCTOSCOPY;  Surgeon: 10/11/2018, MD;  Location: [facility];  Service: General;  Laterality: N/A;  . VAGINAL HYSTERECTOMY     Social History   Social History Narrative  . Not on file    Objective: Vital Signs: There were no vitals taken for this visit.   Physical Exam   Musculoskeletal Exam: ***  CDAI Exam: CDAI Score: Not documented Patient Global Assessment: Not documented; Provider Global Assessment: Not documented Swollen: Not documented; Tender: Not documented Joint Exam   Not documented   There is currently no information documented on the homunculus. Go to the Rheumatology activity and complete the homunculus joint exam.  Investigation: No additional findings.  Imaging: No results found.  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  No orders of the defined types were placed in this encounter.   Face-to-face time spent with patient was *** minutes. Greater than 50% of time was spent in counseling and coordination of care.  Follow-Up Instructions: No follow-ups on file.   Gearldine Bienenstock, PA-C  Note - This record has been created using Dragon software.  Chart creation errors have been sought, but may not always  have been located. Such creation errors do not reflect on  the standard of medical care.

## 2018-11-20 ENCOUNTER — Ambulatory Visit: Payer: BLUE CROSS/BLUE SHIELD | Admitting: Rheumatology

## 2018-11-26 NOTE — Progress Notes (Signed)
Office Visit Note  Patient: Vanessa Collins             Date of Birth: December 27, 1945           MRN: [id number]             PCP: Clovis Riley, L.August Saucer, MD Referring: Clovis Riley, Elbert Ewings.August Saucer, MD Visit Date: 12/04/2018 Occupation: @GUAROCC @  Subjective:  Pain in both hands    History of Present Illness: Vanessa Collins is a 72 y.o. female  history of seropositive rheumatoid arthritis, osteoarthritis, and fibromyalgia.  She is on MTX 0.6 ml once weekly and folic acid 2 mg po daily.  She is taking prednisone 5 mg po daily.  She is taking Celebrex 200 mg 1 capsule by mouth PRN for pain relief.  She continues to have pain in both hands but she denies joint swelling.  She has chronic pain in both ankle joints but she denies any joint swelling. She states she has stiffness for 4 hours in the morning.  She denies any pain at night. She has started taking tumeric which has been helpful.  She states she had a robotic colostomy takedown on 10/09/18 performed by Dr. 10/11/18.  She denies any complications and reports everything healed well.    Activities of Daily Living:  Patient reports morning stiffness for 4  hours.   Patient Denies nocturnal pain.  Difficulty dressing/grooming: Denies Difficulty climbing stairs: Denies Difficulty getting out of chair: Denies Difficulty using hands for taps, buttons, cutlery, and/or writing: Denies  Review of Systems  Constitutional: Positive for fatigue.  HENT: Negative for mouth sores, mouth dryness and nose dryness.   Eyes: Negative for pain, visual disturbance and dryness.  Respiratory: Negative for cough, hemoptysis, shortness of breath and difficulty breathing.   Cardiovascular: Negative for chest pain, palpitations, hypertension and swelling in legs/feet.  Gastrointestinal: Negative for blood in stool, constipation and diarrhea.  Endocrine: Negative for increased urination.  Genitourinary: Negative for painful urination.  Musculoskeletal: Positive for arthralgias, joint  pain, myalgias, morning stiffness, muscle tenderness and myalgias. Negative for joint swelling and muscle weakness.  Skin: Negative for color change, pallor, rash, hair loss, nodules/bumps, skin tightness, ulcers and sensitivity to sunlight.  Allergic/Immunologic: Negative for susceptible to infections.  Neurological: Negative for dizziness, numbness, headaches and weakness.  Hematological: Negative for swollen glands.  Psychiatric/Behavioral: Negative for depressed mood and sleep disturbance. The patient is not nervous/anxious.     PMFS History:  Patient Active Problem List   Diagnosis Date Noted  . Hypertension   . Hyperlipidemia   . Diverticulitis of rectosigmoid s/p robotic LAR/colostomy takedown 10/09/2018 10/09/2018  . 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 History of depression 06/19/2017  . Rheumatoid arthritis involving multiple sites with positive rheumatoid factor (HCC) 01/25/2017  . High risk medication use 01/25/2017  . Immunosuppression (HCC) 01/25/2017  . Fibromyalgia 01/25/2017  . Primary osteoarthritis of both hands 01/25/2017  . Spondylosis of lumbar region without myelopathy or radiculopathy 01/25/2017  . DJD (degenerative joint disease), cervical 01/25/2017  . History of neutropenia 01/25/2017  . History of coronary artery disease 01/25/2017  . 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 Wears dentures     Past Medical History:  Diagnosis Date  . Arthritis    rheumatoid , osteo   . Diverticulitis    with perforation and colostomy placement    . Fibromyalgia   . Heart disease   . Heart murmur   . Hyperlipidemia   . Hypertension   .  Vitamin D deficiency   . Wears dentures    gum disease    Family History  Problem Relation Age of Onset  . Macular degeneration Mother   . Cancer Father   . Rheum arthritis Father   . Bipolar disorder Father   . Bipolar disorder Daughter   . Rheum arthritis Daughter    Past Surgical  History:  Procedure Laterality Date  . CESAREAN SECTION    . COLECTOMY WITH COLOSTOMY CREATION/HARTMANN PROCEDURE  09/2017   Perforated diverticulitis  . CORONARY STENT PLACEMENT  08/2009  . EYE SURGERY    . LAPAROSCOPIC GASTRIC BANDING  04/05/2009   Dr Daphine Deutscher  . PROCTOSCOPY N/A 10/09/2018   Procedure: RIDGID PROCTOSCOPY;  Surgeon: Karie Soda, MD;  Location: [facility];  Service: General;  Laterality: N/A;  . VAGINAL HYSTERECTOMY     Social History   Social History Narrative  . Not on file    Objective: Vital Signs: BP 94/61 (BP Location: Left Arm, Patient Position: Sitting, Cuff Size: Normal)   Pulse 68   Resp 13   Ht 5' 2.5" (1.588 m)   Wt 151 lb 6.4 oz (68.7 kg)   BMI 27.25 kg/m    Physical Exam  Constitutional: She is oriented to person, place, and time. She appears well-developed and well-nourished.  HENT:  Head: Normocephalic and atraumatic.  Eyes: Conjunctivae and EOM are normal.  Neck: Normal range of motion.  Cardiovascular: Normal rate, regular rhythm, normal heart sounds and intact distal pulses.  Pulmonary/Chest: Effort normal and breath sounds normal.  Abdominal: Soft. Bowel sounds are normal.  Lymphadenopathy:    She has no cervical adenopathy.  Neurological: She is alert and oriented to person, place, and time.  Skin: Skin is warm and dry. Capillary refill takes less than 2 seconds.  Psychiatric: She has a normal mood and affect. Her behavior is normal.  Nursing note and vitals reviewed.    Musculoskeletal Exam:C-spine, thoracic spine, and lumbar spine good ROM.  No midline spinal tenderness.  No SI joint tenderness.  Shoulder joints, elbow joints, wrist joints, MCPs, PIPs, and DIPs good ROM with no synovitis. Bilateral CMC joint synovial thickening.  PIP and DIP synovial thickening consistent with osteoarthritis.  Hip joints, knee joints, ankle joints, MTPs, PIPs, and DIPs good ROM with no synovitis.  No warmth or effusion of knee joints.  No tenderness or  swelling of ankle joints.     CDAI Exam: CDAI Score: Not documented Patient Global Assessment: Not documented; Provider Global Assessment: Not documented Swollen: Not documented; Tender: Not documented Joint Exam   Not documented   There is currently no information documented on the homunculus. Go to the Rheumatology activity and complete the homunculus joint exam.  Investigation: No additional findings.  Imaging: No results found.  Recent Labs: Lab Results  Component Value Date   WBC 9.1 10/10/2018   HGB 9.2 (L) 10/10/2018   PLT 187 10/10/2018   NA 141 10/10/2018   K 3.9 10/10/2018   CL 103 10/10/2018   CO2 33 (H) 10/10/2018   GLUCOSE 105 (H) 10/10/2018   BUN 14 10/10/2018   CREATININE 0.86 10/10/2018   BILITOT 0.7 09/12/2018   ALKPHOS 33 06/20/2017   AST 25 09/12/2018   ALT 21 09/12/2018   PROT 6.7 09/12/2018   ALBUMIN 4.1 06/20/2017   CALCIUM 8.3 (L) 10/10/2018   GFRAA >60 10/10/2018    Speciality Comments: Prior therapy includes: Harriette Ohara (GI perforation), Humira (inadequate response), Enbrel (inadequate response), and Orencia (nausea and  headaches)  Procedures:  No procedures performed Allergies: Aspirin    Assessment / Plan:     Visit Diagnoses: Rheumatoid arthritis involving multiple sites with positive rheumatoid factor (HCC) - Positive RF, positive anti-CCP. Harriette Ohara was discontinued after colon rupture: She has no active synovitis on exam.  She is clinically doing well on MTX 0.6 ml sq injections once weekly and folic acid 2 mg po daily. She is unable to increase the dose of MTX due to elevated creatinine. She continues to take prednisone 5 mg by mouth daily.  She has not had any recent RA flares.  She continues to have chronic pain in both hands and both ankle joints but no synovitis noted.  She has morning stiffness lasting about 4 hours.  She will continue on this current treatment regimen.  She was advised to notify us if she develops increased joint pain  or joint swelling. She will follow up in 5 months.   High risk medication use - (MTX 0.6 mL sq q wk, folic acid 2 mg by mouth daily, prednisone 2.5-5 mg by mouth daily (off xeljanz due to colon rupture, has colostomy in place  Most recent CBC/BMP from hospital encounter showed decreased calcium and hemoglobin/hematocrit on 10/10/2018.  Next CBC/CMP due in January and then every 3 months.  Standing orders placed.) - Plan: CBC with Differential/Platelet, COMPLETE METABOLIC PANEL WITH GFR  Primary osteoarthritis of both hands: She has PIP and DIP synovial thickening consistent with osteoarthritis of both hands.  Complete fist formation bilaterally.  Joint protection and muscle strengthening were discussed.   Fibromyalgia: She continues to have generalized muscle aches and muscle tenderness.  She has chronic fatigue. She takes Celebrex and tramadol for pain relief.   DDD (degenerative disc disease), cervical: She has good ROM with no discomfort.  She has no symptoms of radiculopathy at this time.   DDD (degenerative disc disease), lumbar: No midline spinal tenderness.   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 CBC with Differential/Platelet  . COMPLETE METABOLIC PANEL WITH GFR   No orders of the defined types were placed in this encounter.    Follow-Up Instructions: Return in about 5 months (around 05/05/2019) for Rheumatoid arthritis, Osteoarthritis, Fibromyalgia, DDD.   Gearldine Bienenstock, PA-C   I examined and evaluated the patient with Sherron Ales PA.  Patient continues to have arthralgias.  She is in a lot of discomfort but no synovitis was noted on my examination  today.  We discussed combination therapy in the past.  Based on examination we decided to keep her on methotrexate monotherapy for right now.  Patient was in agreement.  The plan of care was discussed as noted above.  Pollyann Savoy, MD  Note - This record has been created using Animal nutritionist.  Chart creation errors have been sought, but may not always  have been located. Such creation errors do not reflect on  the standard of medical care.

## 2018-12-04 ENCOUNTER — Encounter: Payer: Self-pay | Admitting: Rheumatology

## 2018-12-04 ENCOUNTER — Ambulatory Visit (INDEPENDENT_AMBULATORY_CARE_PROVIDER_SITE_OTHER): Payer: BLUE CROSS/BLUE SHIELD | Admitting: Rheumatology

## 2018-12-04 VITALS — BP 94/61 | HR 68 | Resp 13 | Ht 62.5 in | Wt 151.4 lb

## 2018-12-04 DIAGNOSIS — Z79899 Other long term (current) drug therapy: Secondary | ICD-10-CM | POA: Diagnosis not present

## 2018-12-04 DIAGNOSIS — I10 Essential (primary) hypertension: Secondary | ICD-10-CM

## 2018-12-04 DIAGNOSIS — M0579 Rheumatoid arthritis with rheumatoid factor of multiple sites without organ or systems involvement: Secondary | ICD-10-CM

## 2018-12-04 DIAGNOSIS — Z8679 Personal history of other diseases of the circulatory system: Secondary | ICD-10-CM

## 2018-12-04 DIAGNOSIS — Z933 Colostomy status: Secondary | ICD-10-CM

## 2018-12-04 DIAGNOSIS — Z8659 Personal history of other mental and behavioral disorders: Secondary | ICD-10-CM

## 2018-12-04 DIAGNOSIS — M19042 Primary osteoarthritis, left hand: Secondary | ICD-10-CM

## 2018-12-04 DIAGNOSIS — Z9884 Bariatric surgery status: Secondary | ICD-10-CM

## 2018-12-04 DIAGNOSIS — M19041 Primary osteoarthritis, right hand: Secondary | ICD-10-CM | POA: Diagnosis not present

## 2018-12-04 DIAGNOSIS — M797 Fibromyalgia: Secondary | ICD-10-CM

## 2018-12-04 DIAGNOSIS — M5030 Other cervical disc degeneration, unspecified cervical region: Secondary | ICD-10-CM

## 2018-12-04 DIAGNOSIS — Z862 Personal history of diseases of the blood and blood-forming organs and certain disorders involving the immune mechanism: Secondary | ICD-10-CM

## 2018-12-04 DIAGNOSIS — E559 Vitamin D deficiency, unspecified: Secondary | ICD-10-CM

## 2018-12-04 DIAGNOSIS — M5136 Other intervertebral disc degeneration, lumbar region: Secondary | ICD-10-CM

## 2018-12-04 NOTE — Patient Instructions (Signed)
Standing Labs We placed an order today for your standing lab work.    Please come back and get your standing labs in January and every 3 months   We have open lab Monday through Friday from 8:30-11:30 AM and 1:30-4:00 PM  at the office of Dr. Shaili Deveshwar.   You may experience shorter wait times on Monday and Friday afternoons. The office is located at [address], [address], [place], [place] [address] No appointment is necessary.   Labs are drawn by [facility].  You may receive a bill from [facility] for your lab work. If you have any questions regarding directions or hours of operation,  please call [phone].   Just as a reminder please drink plenty of water prior to coming for your lab work. Thanks!   

## 2018-12-11 ENCOUNTER — Other Ambulatory Visit: Payer: Self-pay | Admitting: Rheumatology

## 2018-12-11 DIAGNOSIS — M0579 Rheumatoid arthritis with rheumatoid factor of multiple sites without organ or systems involvement: Secondary | ICD-10-CM

## 2018-12-11 NOTE — Telephone Encounter (Signed)
Last Visit: 12/04/18 Next Visit: 05/08/19 Labs: 09/12/18 low potassium   Okay to refill per Dr. Corliss Skains

## 2019-01-01 ENCOUNTER — Other Ambulatory Visit: Payer: Self-pay | Admitting: Rheumatology

## 2019-01-01 NOTE — Telephone Encounter (Signed)
Last Visit: 12/04/18 Next Visit: 05/08/19 Labs: 09/12/18 low potassium   Okay to refill per Dr. Deveshwar 

## 2019-01-03 ENCOUNTER — Telehealth: Payer: Self-pay | Admitting: Rheumatology

## 2019-01-03 NOTE — Telephone Encounter (Signed)
Patient paid $25.00 in cash, on 01/02/2019 for form sent to [facility].

## 2019-01-13 ENCOUNTER — Telehealth: Payer: Self-pay | Admitting: Rheumatology

## 2019-01-13 MED ORDER — PREDNISONE 5 MG PO TABS: ORAL_TABLET | ORAL | 0 refills | Status: DC

## 2019-01-13 NOTE — Telephone Encounter (Signed)
Prescription sent to the pharmacy and patient notified.  

## 2019-01-13 NOTE — Telephone Encounter (Signed)
Ok to send in prednisone taper starting at 20 mg and tapering by 5 mg every 4 days.  

## 2019-01-13 NOTE — Telephone Encounter (Signed)
Patient called stating she had pain in her hips, back, neck, hands, and ankles over the weekend to the point "where she couldn't move." Patient states she is having inflammation in hips, back, neck, hands and ankle. Patient states her husband is having to help her up to move around. Patient states it started last week and steadily got worse.  Patient is on MTX 0.6 mL weekly. Patient states she had extra Prednisone and took 4 tablets to help with the inflammation.  Patient is requesting Prednisone Dose Pack. Please advise.   Last Visit: 12/04/18 Next Visit: 05/08/19

## 2019-01-13 NOTE — Telephone Encounter (Signed)
Patient called stating she had pain in her hips, back, neck, hands, and ankles over the weekend to the point "where she couldn't move."  Patient states she had extra Prednisone and took 4 tablets to help with the inflammation.  Patient is requesting Prednisone Dose Pack, as well as a prescription refill of Prednisone 5 mg to be sent to [facility] on [address] in [place].  Patient is requesting the prescription be sent today.

## 2019-02-24 ENCOUNTER — Telehealth: Payer: Self-pay | Admitting: Rheumatology

## 2019-02-24 MED ORDER — PREDNISONE 5 MG PO TABS: ORAL_TABLET | ORAL | 0 refills | Status: DC

## 2019-02-24 NOTE — Telephone Encounter (Signed)
Patient called stating she has been experiencing pain and swelling in her ankles, back, and hands.  Patient states she is having a difficult time walking without holding onto something.  Patient is requesting a prescription of Prednisone dose pack to be sent to [facility] on [address] in [place].

## 2019-02-24 NOTE — Telephone Encounter (Addendum)
Patient states she is having to hold onto the hall to get get down the hall. Patient states she has slept for 16 hours and is miserable. Patient states she is currently on MTX 0.6 mL weekly and has not missed any doses.Patient called stating she has been experiencing pain and swelling in her ankles, back, and hands.  Patient is requesting a prescription for prednisone. Please advise.

## 2019-02-24 NOTE — Telephone Encounter (Signed)
Patient advised prescription has been sent to the pharmacy.  

## 2019-02-24 NOTE — Telephone Encounter (Signed)
Ok to send in prednisone taper starting at 20 mg and tapering by 5 mg every 4 days.  

## 2019-03-18 ENCOUNTER — Other Ambulatory Visit: Payer: Self-pay | Admitting: Rheumatology

## 2019-03-19 NOTE — Telephone Encounter (Signed)
Last Visit:12/04/18 Next Visit:05/08/19  Okay to refill per Dr.Deveshwar

## 2019-03-31 ENCOUNTER — Other Ambulatory Visit: Payer: Self-pay | Admitting: Rheumatology

## 2019-03-31 DIAGNOSIS — M0579 Rheumatoid arthritis with rheumatoid factor of multiple sites without organ or systems involvement: Secondary | ICD-10-CM

## 2019-04-01 NOTE — Telephone Encounter (Signed)
ok 

## 2019-04-01 NOTE — Telephone Encounter (Addendum)
Last Visit:12/04/18 Next Visit:05/08/19 Labs: 10/11/19 CO2 33 Glucose 105 Calcium 8.3 Hgb 9.2, RBC 2.77, Hct 28.8 MCV 104  Left message to advise patient she is due for labs.  Okay to refill MTX?

## 2019-04-02 ENCOUNTER — Ambulatory Visit: Admit: 2019-04-02 | Payer: Medicare Other | Admitting: Surgery

## 2019-04-02 SURGERY — REPAIR, HERNIA, VENTRAL, LAPAROSCOPIC
Anesthesia: General

## 2019-04-03 ENCOUNTER — Telehealth: Payer: Self-pay | Admitting: *Deleted

## 2019-04-03 NOTE — Telephone Encounter (Signed)
Spoke with patient and advised the patient we will need labs as her last labs were in October. Patient will got to the [facility] before next refill is due.

## 2019-04-14 ENCOUNTER — Telehealth: Payer: Self-pay | Admitting: Rheumatology

## 2019-04-14 ENCOUNTER — Other Ambulatory Visit: Payer: Self-pay | Admitting: Rheumatology

## 2019-04-14 NOTE — Telephone Encounter (Signed)
Attempted to contact patient and left message on machine to advise patient to call the office.  

## 2019-04-14 NOTE — Telephone Encounter (Signed)
Patient is scheduled for a virtual visit tomorrow 04/15/2019. Advised patient that based on the virtual visit/conversation with Dr. Corliss Skains, she would make the decision for any medication.

## 2019-04-14 NOTE — Telephone Encounter (Signed)
Last Visit: 12/04/2018 Next Visit: 05/08/2019  Okay to refill per Dr. Corliss Skains.

## 2019-04-14 NOTE — Telephone Encounter (Signed)
Patient calling to request rx for Prednisone. Patient's body is badly inflamed, and patient cannot fasten bra or bend to get shoes on. [facility]. Patient also  Scheduled for a virtual appt. tomorrow  4/21, but does not want to wait for medication. Please call to advise.

## 2019-04-15 ENCOUNTER — Telehealth (INDEPENDENT_AMBULATORY_CARE_PROVIDER_SITE_OTHER): Payer: BLUE CROSS/BLUE SHIELD | Admitting: Rheumatology

## 2019-04-15 ENCOUNTER — Encounter: Payer: Self-pay | Admitting: Rheumatology

## 2019-04-15 DIAGNOSIS — Z933 Colostomy status: Secondary | ICD-10-CM

## 2019-04-15 DIAGNOSIS — M19041 Primary osteoarthritis, right hand: Secondary | ICD-10-CM

## 2019-04-15 DIAGNOSIS — M19042 Primary osteoarthritis, left hand: Secondary | ICD-10-CM | POA: Diagnosis not present

## 2019-04-15 DIAGNOSIS — Z862 Personal history of diseases of the blood and blood-forming organs and certain disorders involving the immune mechanism: Secondary | ICD-10-CM

## 2019-04-15 DIAGNOSIS — M5030 Other cervical disc degeneration, unspecified cervical region: Secondary | ICD-10-CM

## 2019-04-15 DIAGNOSIS — M0579 Rheumatoid arthritis with rheumatoid factor of multiple sites without organ or systems involvement: Secondary | ICD-10-CM | POA: Diagnosis not present

## 2019-04-15 DIAGNOSIS — E559 Vitamin D deficiency, unspecified: Secondary | ICD-10-CM

## 2019-04-15 DIAGNOSIS — M5136 Other intervertebral disc degeneration, lumbar region: Secondary | ICD-10-CM

## 2019-04-15 DIAGNOSIS — Z79899 Other long term (current) drug therapy: Secondary | ICD-10-CM | POA: Diagnosis not present

## 2019-04-15 DIAGNOSIS — Z8659 Personal history of other mental and behavioral disorders: Secondary | ICD-10-CM

## 2019-04-15 DIAGNOSIS — Z8679 Personal history of other diseases of the circulatory system: Secondary | ICD-10-CM

## 2019-04-15 DIAGNOSIS — M797 Fibromyalgia: Secondary | ICD-10-CM

## 2019-04-15 DIAGNOSIS — Z9884 Bariatric surgery status: Secondary | ICD-10-CM

## 2019-04-15 MED ORDER — PREDNISONE 5 MG PO TABS: ORAL_TABLET | ORAL | 0 refills | Status: DC

## 2019-04-15 NOTE — Progress Notes (Signed)
Virtual Visit via Video Note  I connected with Vanessa Collins on 04/15/19 at  2:30 PM EDT by a video enabled telemedicine application and verified that I am speaking with the correct person using two identifiers.   I discussed the limitations of evaluation and management by telemedicine from my office and the availability of in person appointments. The patient expressed understanding and agreed to proceed.Patient was at home.   CC: Pain in multiple joints   History of Present Illness: Patient is a 73 year old female with a past medical history of seropositive rheumatoid arthritis, osteoarthritis, and fibromyalgia.  She is on MTX 0.6 ml sq once weekly, folic acid 2 mg po daily, prednisone 2.5 mg po daily. She is having pain in bilateral shoulder joints, elbow joints, and bilateral hands.  She is having difficulty with ADLs.  She is having difficulty walking due to the discomfort she is experiencing.  She states this the worst she has ever felt.     Review of Systems  Constitutional: Negative for fever and malaise/fatigue.  Eyes: Negative for photophobia, pain, discharge and redness.  Respiratory: Negative for cough, shortness of breath and wheezing.   Cardiovascular: Negative for chest pain and palpitations.  Gastrointestinal: Negative for blood in stool, constipation and diarrhea.  Genitourinary: Negative for dysuria.  Musculoskeletal: Positive for joint pain. Negative for back pain, myalgias and neck pain.       +Joint stiffness +Joint swelling  Skin: Negative for rash.  Neurological: Negative for dizziness and headaches.  Psychiatric/Behavioral: Negative for depression. The patient is not nervous/anxious and does not have insomnia.    Observations/Objective: Physical Exam  Constitutional: She is oriented to person, place, and time and well-developed, well-nourished, and in no distress.  HENT:  Head: Normocephalic and atraumatic.  Eyes: Conjunctivae are normal.  Pulmonary/Chest: Effort  normal.  Neurological: She is alert and oriented to person, place, and time.  Psychiatric: Mood, memory, affect and judgment normal.   Patient reports joint stiffness all ay.   Patient reports nocturnal pain.  Difficulty dressing/grooming: Denies Difficulty climbing stairs: Reports Difficulty getting out of chair: Reports Difficulty using hands for taps, buttons, cutlery, and/or writing: Reports   She has swelling and tenderness of all MCPs and PIP joints.   Assessment and Plan: Rheumatoid arthritis involving multiple sites with positive rheumatoid factor (HCC) - Positive RF, positive anti-CCP. Harriette Ohara was discontinued after colon rupture: She is currently having a rheumatoid arthritis flare.  She is having pain in both shoulder joints, both elbow joints, and both hands.  She has swelling of all  MCPs and PIPs joints.  She has been having difficulty with ADLs and walking due to the pain she has been experiencing.  She is currently on MTX 0.6 ml sq once weekly and folic acid 2 mg po daily. She has not missed any doses. She continues to take prednisone 5 mg po daily.  She requested a Prednisone taper.  We will send in prednisone 20 mg and she will taper by 5 mg every 4 days.  We discussed adding on Plaquenil to her current treatment regimen.  Indications, contraindications, and potential side effects of plaquenil were discussed. She will stop by the office to complete the consent form and receive a handout of information.  We will provide a PLQ eye exam form to take with her to her appointment.  She will notify us if she cannot tolerate taking PLQ.  She will follow up in 3 months.   Patient was counseled  on the purpose, proper use, and adverse effects of hydroxychloroquine including nausea/diarrhea, skin rash, headaches, and sun sensitivity.  Discussed importance of annual eye exams while on hydroxychloroquine to monitor to ocular toxicity and discussed importance of frequent laboratory monitoring.   Provided patient with eye exam form for baseline ophthalmologic exam.  Provided patient with educational materials on hydroxychloroquine and answered all questions.  She will come by the office to send the consent form.    She will take plaquenil 200 mg 1 tablet by mouth BID.    High risk medication use - MTX 0.6 mL sq q wk, folic acid 2 mg by mouth daily, prednisone 5 mg by mouth daily (off xeljanz due to colon rupture, has colostomy in place.)  We will be adding on PLQ to her current reigmen.  She will require lab work in 1 month then every 3 months. Standing orders are in place.  She will schedule a baseline PLQ eye exam.    Primary osteoarthritis of both hands:  She is having severe pain in both hands.  Fibromyalgia: She has generalized muscle aches and muscle tenderness.    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  Follow Up Instructions: She will follow up in 3 months. Standing orders are in place.  She is due to update lab work, and she is planning on going tomorrow.  Future order for G6PD will be placed today.   She will come by the office tomorrow to sign PLQ consent form. We will send in the prescription for PLQ after she signs the consent form. She will schedule a PLQ eye exam.   A prednisone taper will be sent to the pharmacy.    I discussed the assessment and treatment plan with the patient. The patient was provided an opportunity to ask questions and all were answered. The patient agreed with the plan and demonstrated an understanding of the instructions.   The patient was advised to call back or seek an in-person evaluation if the symptoms worsen or if the condition fails to improve as anticipated.  I provided 30 minutes of non-face-to-face time during this encounter.  Pollyann SavoyShaili Deveshwar, MD  Scribed by-  Gearldine Bienenstockaylor M Dale,  PA-C

## 2019-04-16 ENCOUNTER — Other Ambulatory Visit: Payer: Self-pay | Admitting: *Deleted

## 2019-04-16 DIAGNOSIS — Z79899 Other long term (current) drug therapy: Secondary | ICD-10-CM

## 2019-04-16 NOTE — Patient Instructions (Signed)
Standing Labs We placed an order today for your standing lab work.    Please come back and get your standing labs in 1 month after starting plaquenil and then every 3 months.  We have open lab Monday through Friday from 8:30-11:30 AM and 1:30-4:00 PM  at the office of Dr. Shaili Deveshwar.   You may experience shorter wait times on Monday and Friday afternoons. The office is located at [address], [address], [place], [place] [address] No appointment is necessary.   Labs are drawn by [facility].  You may receive a bill from [facility] for your lab work.  If you wish to have your labs drawn at another location, please call the office 24 hours in advance to send orders.  If you have any questions regarding directions or hours of operation,  please call [phone].   Just as a reminder please drink plenty of water prior to coming for your lab work. Thanks!   Hydroxychloroquine tablets What is this medicine? HYDROXYCHLOROQUINE (hye drox ee KLOR oh kwin) is used to treat rheumatoid arthritis and systemic lupus erythematosus. It is also used to treat malaria. This medicine may be used for other purposes; ask your health care provider or pharmacist if you have questions. COMMON BRAND NAME(S): Plaquenil, Quineprox What should I tell my health care provider before I take this medicine? They need to know if you have any of these conditions: -diabetes -eye disease, vision problems -G6PD deficiency -history of blood diseases -history of irregular heartbeat -if you often drink alcohol -kidney disease -liver disease -porphyria -psoriasis -seizures -an unusual or allergic reaction to chloroquine, hydroxychloroquine, other medicines, foods, dyes, or preservatives -pregnant or trying to get pregnant -breast-feeding How should I use this medicine? Take this medicine by mouth with a glass of water. Follow the directions on the prescription label. Avoid taking antacids within 4 hours of taking  this medicine. It is best to separate these medicines by at least 4 hours. Do not cut, crush or chew this medicine. You can take it with or without food. If it upsets your stomach, take it with food. Take your medicine at regular intervals. Do not take your medicine more often than directed. Take all of your medicine as directed even if you think you are better. Do not skip doses or stop your medicine early. Talk to your pediatrician regarding the use of this medicine in children. While this drug may be prescribed for selected conditions, precautions do apply. Overdosage: If you think you have taken too much of this medicine contact a poison control center or emergency room at once. NOTE: This medicine is only for you. Do not share this medicine with others. What if I miss a dose? If you miss a dose, take it as soon as you can. If it is almost time for your next dose, take only that dose. Do not take double or extra doses. What may interact with this medicine? Do not take this medicine with any of the following medications: -cisapride -dofetilide -dronedarone -live virus vaccines -penicillamine -pimozide -thioridazine -ziprasidone This medicine may also interact with the following medications: -ampicillin -antacids -cimetidine -cyclosporine -digoxin -medicines for diabetes, like insulin, glipizide, glyburide -medicines for seizures like carbamazepine, phenobarbital, phenytoin -mefloquine -methotrexate -other medicines that prolong the QT interval (cause an abnormal heart rhythm) -praziquantel This list may not describe all possible interactions. Give your health care provider a list of all the medicines, herbs, non-prescription drugs, or dietary supplements you use. Also tell them if you   smoke, drink alcohol, or use illegal drugs. Some items may interact with your medicine. What should I watch for while using this medicine? Tell your doctor or healthcare professional if your symptoms do  not start to get better or if they get worse. Avoid taking antacids within 4 hours of taking this medicine. It is best to separate these medicines by at least 4 hours. Tell your doctor or health care professional right away if you have any change in your eyesight. Your vision and blood may be tested before and during use of this medicine. This medicine can make you more sensitive to the sun. Keep out of the sun. If you cannot avoid being in the sun, wear protective clothing and use sunscreen. Do not use sun lamps or tanning beds/booths. What side effects may I notice from receiving this medicine? Side effects that you should report to your doctor or health care professional as soon as possible: -allergic reactions like skin rash, itching or hives, swelling of the face, lips, or tongue -changes in vision -decreased hearing or ringing of the ears -redness, blistering, peeling or loosening of the skin, including inside the mouth -seizures -sensitivity to light -signs and symptoms of a dangerous change in heartbeat or heart rhythm like chest pain; dizziness; fast or irregular heartbeat; palpitations; feeling faint or lightheaded, falls; breathing problems -signs and symptoms of liver injury like dark yellow or brown urine; general ill feeling or flu-like symptoms; light-colored stools; loss of appetite; nausea; right upper belly pain; unusually weak or tired; yellowing of the eyes or skin -signs and symptoms of low blood sugar such as feeling anxious; confusion; dizziness; increased hunger; unusually weak or tired; sweating; shakiness; cold; irritable; headache; blurred vision; fast heartbeat; loss of consciousness -uncontrollable head, mouth, neck, arm, or leg movements Side effects that usually do not require medical attention (report to your doctor or health care professional if they continue or are bothersome): -anxious -diarrhea -dizziness -hair loss -headache -irritable -loss of  appetite -nausea, vomiting -stomach pain This list may not describe all possible side effects. Call your doctor for medical advice about side effects. You may report side effects to FDA at 1-800-FDA-1088. Where should I keep my medicine? Keep out of the reach of children. In children, this medicine can cause overdose with small doses. Store at room temperature between 15 and 30 degrees C (59 and 86 degrees F). Protect from moisture and light. Throw away any unused medicine after the expiration date. NOTE: This sheet is a summary. It may not cover all possible information. If you have questions about this medicine, talk to your doctor, pharmacist, or health care provider.  2019 Elsevier/Gold Standard (2016-07-26 14:16:15)  

## 2019-04-17 ENCOUNTER — Telehealth: Payer: Self-pay | Admitting: Rheumatology

## 2019-04-17 LAB — CBC WITH DIFFERENTIAL/PLATELET
Absolute Monocytes: 299 cells/uL (ref 200–950)
Basophils Absolute: 58 cells/uL (ref 0–200)
Basophils Relative: 0.8 %
Eosinophils Absolute: 29 cells/uL (ref 15–500)
Eosinophils Relative: 0.4 %
HCT: 36.5 % (ref 35.0–45.0)
Hemoglobin: 12.4 g/dL (ref 11.7–15.5)
Lymphs Abs: 723 cells/uL — ABNORMAL LOW (ref 850–3900)
MCH: 34 pg — ABNORMAL HIGH (ref 27.0–33.0)
MCHC: 34 g/dL (ref 32.0–36.0)
MCV: 100 fL (ref 80.0–100.0)
MPV: 11.3 fL (ref 7.5–12.5)
Monocytes Relative: 4.1 %
Neutro Abs: 6190 cells/uL (ref 1500–7800)
Neutrophils Relative %: 84.8 %
Platelets: 253 10*3/uL (ref 140–400)
RBC: 3.65 10*6/uL — ABNORMAL LOW (ref 3.80–5.10)
RDW: 13.3 % (ref 11.0–15.0)
Total Lymphocyte: 9.9 %
WBC: 7.3 10*3/uL (ref 3.8–10.8)

## 2019-04-17 LAB — COMPLETE METABOLIC PANEL WITH GFR
AG Ratio: 1.8 (calc) (ref 1.0–2.5)
ALT: 25 U/L (ref 6–29)
AST: 25 U/L (ref 10–35)
Albumin: 4.1 g/dL (ref 3.6–5.1)
Alkaline phosphatase (APISO): 37 U/L (ref 37–153)
BUN: 24 mg/dL (ref 7–25)
CO2: 32 mmol/L (ref 20–32)
Calcium: 9.9 mg/dL (ref 8.6–10.4)
Chloride: 100 mmol/L (ref 98–110)
Creat: 0.84 mg/dL (ref 0.60–0.93)
GFR, Est African American: 82 mL/min/{1.73_m2} (ref >=60)
GFR, Est Non African American: 70 mL/min/{1.73_m2} (ref >=60)
Globulin: 2.3 g/dL (calc) (ref 1.9–3.7)
Glucose, Bld: 102 mg/dL (ref 65–139)
Potassium: 3.9 mmol/L (ref 3.5–5.3)
Sodium: 139 mmol/L (ref 135–146)
Total Bilirubin: 0.5 mg/dL (ref 0.2–1.2)
Total Protein: 6.4 g/dL (ref 6.1–8.1)

## 2019-04-17 LAB — GLUCOSE 6 PHOSPHATE DEHYDROGENASE: G-6PDH: 16.2 U/g Hgb (ref 7.0–20.5)

## 2019-04-17 MED ORDER — HYDROXYCHLOROQUINE SULFATE 200 MG PO TABS: 200.0000 mg | ORAL_TABLET | Freq: Every day | ORAL | 2 refills | Status: DC

## 2019-04-17 NOTE — Progress Notes (Signed)
CMP WNL.  RBC count is low but trending up.

## 2019-04-17 NOTE — Telephone Encounter (Signed)
Patient called stating she had virtual appointment on 04/15/19.  Patient states Dr. Corliss Skains told her she would send prescriptions of Plaquenil and Methotrexate to [facility] if her labwork came back okay.

## 2019-04-17 NOTE — Telephone Encounter (Signed)
Patient advised prescription for MTX was sent on 04/01/19 and her husband picked up on 04/04/19. Patient advised her insurance will only cover 30 days at a tome. Patient advised sending PLQ to pharmacy.

## 2019-04-18 NOTE — Progress Notes (Signed)
G6PDH WNL

## 2019-04-30 NOTE — Progress Notes (Signed)
Please place orders in Epic as patient is being scheduled for a pre-op appointment! Thank you! 

## 2019-05-05 NOTE — Progress Notes (Deleted)
Office Visit Note  Patient: Vanessa Collins             Date of Birth: 1946/12/20           MRN: [id number]             PCP: Clovis Riley, L.August Saucer, MD Referring: Clovis Riley, Elbert Ewings.August Saucer, MD Visit Date: 05/08/2019 Occupation: @GUAROCC @  Subjective:  No chief complaint on file.   History of Present Illness: Vanessa Collins is a 73 y.o. female ***   Activities of Daily Living:  Patient reports morning stiffness for *** {minute/hour:19697}.   Patient {ACTIONS;DENIES/REPORTS:21021675::"Denies"} nocturnal pain.  Difficulty dressing/grooming: {ACTIONS;DENIES/REPORTS:21021675::"Denies"} Difficulty climbing stairs: {ACTIONS;DENIES/REPORTS:21021675::"Denies"} Difficulty getting out of chair: {ACTIONS;DENIES/REPORTS:21021675::"Denies"} Difficulty using hands for taps, buttons, cutlery, and/or writing: {ACTIONS;DENIES/REPORTS:21021675::"Denies"}  No Rheumatology ROS completed.   PMFS History:  Patient Active Problem List   Diagnosis Date Noted  . Hypertension   . Hyperlipidemia   . Diverticulitis of rectosigmoid s/p robotic LAR/colostomy takedown 10/09/2018 10/09/2018  . 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 History of depression 06/19/2017  . Rheumatoid arthritis involving multiple sites with positive rheumatoid factor (HCC) 01/25/2017  . High risk medication use 01/25/2017  . Immunosuppression (HCC) 01/25/2017  . Fibromyalgia 01/25/2017  . Primary osteoarthritis of both hands 01/25/2017  . Spondylosis of lumbar region without myelopathy or radiculopathy 01/25/2017  . DJD (degenerative joint disease), cervical 01/25/2017  . History of neutropenia 01/25/2017  . History of coronary artery disease 01/25/2017  . 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 Wears dentures     Past Medical History:  Diagnosis Date  . Arthritis    rheumatoid , osteo   . Diverticulitis    with perforation and colostomy placement    . Fibromyalgia   . Heart disease   . Heart murmur    . Hyperlipidemia   . Hypertension   . Vitamin D deficiency   . Wears dentures    gum disease    Family History  Problem Relation Age of Onset  . Macular degeneration Mother   . Cancer Father   . Rheum arthritis Father   . Bipolar disorder Father   . Bipolar disorder Daughter   . Rheum arthritis Daughter    Past Surgical History:  Procedure Laterality Date  . CESAREAN SECTION    . COLECTOMY WITH COLOSTOMY CREATION/HARTMANN PROCEDURE  09/2017   Perforated diverticulitis  . CORONARY STENT PLACEMENT  08/2009  . EYE SURGERY    . LAPAROSCOPIC GASTRIC BANDING  04/05/2009   Dr Daphine Deutscher  . PROCTOSCOPY N/A 10/09/2018   Procedure: RIDGID PROCTOSCOPY;  Surgeon: Karie Soda, MD;  Location: [facility];  Service: General;  Laterality: N/A;  . VAGINAL HYSTERECTOMY     Social History   Social History Narrative  . Not on file    There is no immunization history on file for this patient.   Objective: Vital Signs: There were no vitals taken for this visit.   Physical Exam   Musculoskeletal Exam: ***  CDAI Exam: CDAI Score: Not documented Patient Global Assessment: Not documented; Provider Global Assessment: Not documented Swollen: Not documented; Tender: Not documented Joint Exam   Not documented   There is currently no information documented on the homunculus. Go to the Rheumatology activity and complete the homunculus joint exam.  Investigation: No additional findings.  Imaging: No results found.  Recent Labs: Lab Results  Component Value Date   WBC 7.3 04/16/2019   HGB 12.4 04/16/2019   PLT 253 04/16/2019  NA 139 04/16/2019   K 3.9 04/16/2019   CL 100 04/16/2019   CO2 32 04/16/2019   GLUCOSE 102 04/16/2019   BUN 24 04/16/2019   CREATININE 0.84 04/16/2019   BILITOT 0.5 04/16/2019   ALKPHOS 33 06/20/2017   AST 25 04/16/2019   ALT 25 04/16/2019   PROT 6.4 04/16/2019   ALBUMIN 4.1 06/20/2017   CALCIUM 9.9 04/16/2019   GFRAA 82 04/16/2019    Speciality  Comments: Prior therapy includes: Harriette OharaXeljanz (GI perforation), Humira (inadequate response), Enbrel (inadequate response), and Orencia (nausea and headaches)  Procedures:  No procedures performed Allergies: Aspirin   Assessment / Plan:     Visit Diagnoses: No diagnosis found.   Orders: No orders of the defined types were placed in this encounter.  No orders of the defined types were placed in this encounter.   Face-to-face time spent with patient was *** minutes. Greater than 50% of time was spent in counseling and coordination of care.  Follow-Up Instructions: No follow-ups on file.   Ellen HenriMarissa C Karey Stucki, CMA  Note - This record has been created using Animal nutritionistDragon software.  Chart creation errors have been sought, but may not always  have been located. Such creation errors do not reflect on  the standard of medical care.

## 2019-05-08 ENCOUNTER — Ambulatory Visit: Payer: Self-pay | Admitting: Rheumatology

## 2019-05-13 ENCOUNTER — Telehealth: Payer: Self-pay | Admitting: Rheumatology

## 2019-05-13 NOTE — Telephone Encounter (Signed)
Noted  

## 2019-05-13 NOTE — Telephone Encounter (Signed)
Patient left a voicemail stating the earliest eye exam appointment she could make was 05/28/19.

## 2019-05-21 ENCOUNTER — Telehealth: Payer: Self-pay | Admitting: Rheumatology

## 2019-05-21 MED ORDER — PREDNISONE 5 MG PO TABS: ORAL_TABLET | ORAL | 0 refills | Status: DC

## 2019-05-21 NOTE — Telephone Encounter (Signed)
Patient called stating she has additional information to give you.  Patient requested a return call.

## 2019-05-21 NOTE — Telephone Encounter (Signed)
Okay to give prednisone taper starting at 20 mg with every 4-day taper by 5 mg

## 2019-05-21 NOTE — Telephone Encounter (Signed)
Patient states is requesting a prednisone taper. Patient states she is having trouble turing her neck. Patient states she is having trouble walking. Patient states she is also having pain in her ankles and her hands. Patient states she is also having swelling. Patient states she had to stop the PLQ. Patient states it was giving her terrible headaches. Patient states she also has a wound on her leg that she is waiting to heal. Patient states once that heals she will try to restart medication. Patient states "I just have to have more prednisone". Please advise.

## 2019-05-21 NOTE — Telephone Encounter (Signed)
Patient advised prednisone taper sent to the pharmacy.  

## 2019-06-16 ENCOUNTER — Other Ambulatory Visit: Payer: Self-pay | Admitting: Rheumatology

## 2019-06-16 DIAGNOSIS — M0579 Rheumatoid arthritis with rheumatoid factor of multiple sites without organ or systems involvement: Secondary | ICD-10-CM

## 2019-06-16 NOTE — Telephone Encounter (Signed)
Please schedule patient for a follow up visit. Patient due July 2020. Thanks! 

## 2019-06-16 NOTE — Telephone Encounter (Signed)
Last Visit: 04/15/19 Next visit due July 2020. Message sent to the front to schedule patient. Labs: 04/16/19 CMP WNL. RBC count is low but trending up.   Okay to refill per Dr. Estanislado Pandy

## 2019-06-24 ENCOUNTER — Other Ambulatory Visit: Payer: Self-pay | Admitting: Rheumatology

## 2019-06-24 NOTE — Telephone Encounter (Signed)
Last Visit: 04/15/19 Next Visit: 07/24/19  Okay to refill per Dr. Estanislado Pandy

## 2019-07-02 ENCOUNTER — Ambulatory Visit: Payer: Self-pay | Admitting: Surgery

## 2019-07-02 ENCOUNTER — Telehealth: Payer: Self-pay | Admitting: Rheumatology

## 2019-07-02 NOTE — Telephone Encounter (Signed)
Okay to give prednisone taper starting at 15 mg and taper by 5 mg every 2 days until she reaches 5 mg p.o. daily.  She should discontinue methotrexate 1 week prior to the surgery and may start 1 or 2 weeks after the surgery if there are no signs of infection and she is healing well.

## 2019-07-02 NOTE — Telephone Encounter (Signed)
Patient states she is having surgery in a couple of weeks on 07/11/19. Patient states she was having trouble walking and increased her prednisone on her own to 15 mg for the last 2 days. Patient states she is now able to walk. Patient would like to 3 again for the next two days and then go taper down 10 mg. Patient advised she may stay on the PLQ. Patient states she is on the MTX and would like to know when to stop MTX. Patient is also requesting a bone density scan. Patient is unsure of where she had it last. Please advise.

## 2019-07-02 NOTE — Telephone Encounter (Signed)
Patient calling in reference to Prednisone. Patient request a call back to discuss.

## 2019-07-03 MED ORDER — PREDNISONE 5 MG PO TABS: ORAL_TABLET | ORAL | 0 refills | Status: DC

## 2019-07-03 NOTE — Telephone Encounter (Signed)
Patient advised sending in prednisone taper starting at 15 mg and taper by 5 mg every 2 days until she reaches 5 mg p.o. daily.  Patient advised she should discontinue methotrexate 1 week prior to the surgery and may start 1 or 2 weeks after the surgery if there are no signs of infection and she is healing well.Patient verbalized understanding.

## 2019-07-03 NOTE — Addendum Note (Signed)
Addended by: Carole Binning on: 07/03/2019 04:04 PM   Modules accepted: Orders

## 2019-07-03 NOTE — Telephone Encounter (Signed)
Attempted to contact the patient and left message for patient to call the office.  

## 2019-07-03 NOTE — Telephone Encounter (Signed)
Patient returned call to the office. Attempted to return patient's call and left message for patient to call the office.  

## 2019-07-08 ENCOUNTER — Other Ambulatory Visit (HOSPITAL_COMMUNITY)
Admission: RE | Admit: 2019-07-08 | Discharge: 2019-07-08 | Disposition: A | Discharge status: Home / Self Care | Payer: BC Managed Care – PPO | Source: Ambulatory Visit | Attending: Surgery | Admitting: Surgery

## 2019-07-08 ENCOUNTER — Other Ambulatory Visit (HOSPITAL_COMMUNITY): Payer: Self-pay | Admitting: *Deleted

## 2019-07-08 DIAGNOSIS — Z1159 Encounter for screening for other viral diseases: Secondary | ICD-10-CM | POA: Insufficient documentation

## 2019-07-08 NOTE — Patient Instructions (Addendum)
YOU HAD   A COVID 19 TEST ON 07-08-2019.ONCE YOUR COVID TEST IS COMPLETED, PLEASE BEGIN THE QUARANTINE INSTRUCTIONS AS OUTLINED IN YOUR HANDOUT.                Vanessa Collins    Your procedure is scheduled on: 07-11-2019   Report to [facility] Main  Entrance  Report to admitting at 7:30  AM      Call this number if you have problems the morning of surgery [phone]     Remember: Do not eat food or drink liquids :After Midnight. BRUSH YOUR TEETH MORNING OF SURGERY AND RINSE YOUR MOUTH OUT, NO CHEWING GUM CANDY OR MINTS.     Take these medicines the morning of surgery with A SIP OF WATER: Zoloft, Zyrtec, Toprol-XL, Synthroid, Pepcid                               You may not have any metal on your body including hair pins and              piercings              Do not wear jewelry, make-up, lotions, powders or perfumes, deodorant             Do not wear nail polish.  Do not shave  48 hours prior to surgery.                Do not bring valuables to the hospital. [facility] IS NOT             RESPONSIBLE   FOR VALUABLES.  Contacts, dentures or bridgework may not be worn into surgery.                   Please read over the following fact sheets you were given: _____________________________________________________________________             [facility] - Preparing for Surgery Before surgery, you can play an important role .  Because skin is not sterile, your skin needs to be as free of germs as possible .  You can reduce the number of germs on your skin by washing with CHG (chlorahexidine gluconate) soap before surgery.   CHG is an antiseptic cleaner which kills germs and bonds with the skin to continue killing germs even after washing. Please DO NOT use if you have an allergy to CHG or antibacterial soaps .  If your skin becomes reddened/irritated stop using the CHG and inform your nurse when you arrive at Short Stay. Do not shave (including legs and underarms) for  at least 48 hours prior to the first CHG shower.  Please follow these instructions carefully:  1.  Shower with CHG Soap the night before surgery and the  morning of Surgery.  2.  If you choose to wash your hair, wash your hair first as usual with your  normal  shampoo.  3.  After you shampoo, rinse your hair and body thoroughly to remove the  shampoo.                                        4.  Use CHG as you would any other liquid soap.  You can apply chg directly  to the skin and wash  Gently with a scrungie or clean washcloth.  5.  Apply the CHG Soap to your body ONLY FROM THE NECK DOWN.   Do not use on face/ open                           Wound or open sores. Avoid contact with eyes, ears mouth and genitals (private parts).                       Wash face,  Genitals (private parts) with your normal soap.             6.  Wash thoroughly, paying special attention to the area where your surgery  will be performed.  7.  Thoroughly rinse your body with warm water from the neck down.  8.  DO NOT shower/wash with your normal soap after using and rinsing off  the CHG Soap.             9.  Pat yourself dry with a clean towel.            10.  Wear clean pajamas.            11.  Place clean sheets on your bed the night of your first shower and do not  sleep with pets . Day of Surgery : Do not apply any lotions/deodorants the morning of surgery.  Please wear clean clothes to the hospital/surgery center.   FAILURE TO FOLLOW THESE INSTRUCTIONS MAY RESULT IN THE CANCELLATION OF YOUR SURGERY PATIENT SIGNATURE_________________________________  NURSE SIGNATURE__________________________________  ________________________________________________________________________

## 2019-07-08 NOTE — Progress Notes (Signed)
STRESS TEST 08-05-18 Epic TELEPHONE NOTE ANDREA HATTON LPN ADVISING PT TO STOP METHOTREXATE 1 WEEK PRIOR TO SURGERY 07-03-19 EPIC

## 2019-07-09 ENCOUNTER — Encounter (HOSPITAL_COMMUNITY): Payer: Self-pay

## 2019-07-09 ENCOUNTER — Encounter (HOSPITAL_COMMUNITY)
Admission: RE | Admit: 2019-07-09 | Discharge: 2019-07-09 | Disposition: A | Discharge status: Home / Self Care | Payer: BC Managed Care – PPO | Source: Ambulatory Visit | Attending: Surgery | Admitting: Surgery

## 2019-07-09 ENCOUNTER — Other Ambulatory Visit: Payer: Self-pay

## 2019-07-09 DIAGNOSIS — Z01818 Encounter for other preprocedural examination: Secondary | ICD-10-CM | POA: Diagnosis not present

## 2019-07-09 DIAGNOSIS — I10 Essential (primary) hypertension: Secondary | ICD-10-CM | POA: Insufficient documentation

## 2019-07-09 DIAGNOSIS — K432 Incisional hernia without obstruction or gangrene: Secondary | ICD-10-CM | POA: Diagnosis not present

## 2019-07-09 HISTORY — DX: Atherosclerotic heart disease of native coronary artery without angina pectoris: I25.10

## 2019-07-09 LAB — CBC
HCT: 41 % (ref 36.0–46.0)
Hemoglobin: 13.1 g/dL (ref 12.0–15.0)
MCH: 34.8 pg — ABNORMAL HIGH (ref 26.0–34.0)
MCHC: 32 g/dL (ref 30.0–36.0)
MCV: 109 fL — ABNORMAL HIGH (ref 80.0–100.0)
Platelets: 225 10*3/uL (ref 150–400)
RBC: 3.76 MIL/uL — ABNORMAL LOW (ref 3.87–5.11)
RDW: 14.4 % (ref 11.5–15.5)
WBC: 6.9 10*3/uL (ref 4.0–10.5)
nRBC: 0 % (ref 0.0–0.2)

## 2019-07-09 LAB — BASIC METABOLIC PANEL
Anion gap: 9 (ref 5–15)
BUN: 19 mg/dL (ref 8–23)
CO2: 30 mmol/L (ref 22–32)
Calcium: 9.3 mg/dL (ref 8.9–10.3)
Chloride: 102 mmol/L (ref 98–111)
Creatinine, Ser: 0.92 mg/dL (ref 0.44–1.00)
GFR calc Af Amer: >60 mL/min (ref >60)
GFR calc non Af Amer: >60 mL/min (ref >60)
Glucose, Bld: 119 mg/dL — ABNORMAL HIGH (ref 70–99)
Potassium: 3.9 mmol/L (ref 3.5–5.1)
Sodium: 141 mmol/L (ref 135–145)

## 2019-07-09 LAB — SARS CORONAVIRUS 2 (TAT 6-24 HRS): SARS Coronavirus 2: NEGATIVE

## 2019-07-10 ENCOUNTER — Encounter (HOSPITAL_COMMUNITY): Payer: Self-pay

## 2019-07-10 ENCOUNTER — Other Ambulatory Visit: Payer: Self-pay | Admitting: Rheumatology

## 2019-07-10 ENCOUNTER — Telehealth: Payer: Self-pay | Admitting: Rheumatology

## 2019-07-10 MED ORDER — BUPIVACAINE LIPOSOME 1.3 % IJ SUSP
20.0000 mL | INTRAMUSCULAR | Status: DC
  Filled 2019-07-10: qty 20

## 2019-07-10 NOTE — Telephone Encounter (Signed)
Patient called requesting prescription refill of Prednisone to be sent to [facility] on [address] in [place].  Patient states she is out of medication.

## 2019-07-10 NOTE — Anesthesia Preprocedure Evaluation (Addendum)
Anesthesia Evaluation  Patient identified by MRN, date of birth, ID band Patient awake    Reviewed: Allergy & Precautions, NPO status , Patient's Chart, lab work & pertinent test results  Airway Mallampati: II  TM Distance: >3 FB Neck ROM: Full    Dental  (+) Dental Advisory Given, Lower Dentures, Partial Upper   Pulmonary former smoker,    Pulmonary exam normal breath sounds clear to auscultation       Cardiovascular hypertension, Pt. on medications + CAD (DES to LAD 2010, mild stenosis in other vessels), + Past MI and + Cardiac Stents  Normal cardiovascular exam Rhythm:Regular Rate:Normal     Neuro/Psych negative neurological ROS  negative psych ROS   GI/Hepatic Neg liver ROS, GERD  Medicated,S/p colostomy   Endo/Other  negative endocrine ROS  Renal/GU negative Renal ROS     Musculoskeletal  (+) Arthritis , Fibromyalgia -  Abdominal   Peds  Hematology negative hematology ROS (+)   Anesthesia Other Findings Day of surgery medications reviewed with the patient.  Reproductive/Obstetrics                           Anesthesia Physical Anesthesia Plan  ASA: III  Anesthesia Plan: General   Post-op Pain Management:    Induction: Intravenous  PONV Risk Score and Plan: 4 or greater and Dexamethasone and Treatment may vary due to age or medical condition  Airway Management Planned: Oral ETT  Additional Equipment:   Intra-op Plan:   Post-operative Plan:   Informed Consent: I have reviewed the patients History and Physical, chart, labs and discussed the procedure including the risks, benefits and alternatives for the proposed anesthesia with the patient or authorized representative who has indicated his/her understanding and acceptance.     Dental advisory given  Plan Discussed with: CRNA  Anesthesia Plan Comments: (See APP note by Willeen Cass, FNP)       Anesthesia Quick  Evaluation

## 2019-07-10 NOTE — Progress Notes (Signed)
Clearance  requested from Dr. Cindi Carbon office. Records are pending. ASA and methotrexate were stopped 7/12

## 2019-07-10 NOTE — Telephone Encounter (Signed)
Patient states she spoke with the pharmacy and she is able to pick up the prescription from the pharmacy. Patient advised she should not take the Celebrex with Prednisone and MTX. Patient states she can't move without the medications. Patient states she has saw an advertisement for a new medication for RA and is interested in it. Patient was not sure of the name of the medication but would like to discuss it at her appointment on 07/24/19.

## 2019-07-10 NOTE — Telephone Encounter (Signed)
Attempted to contact the patient and left message for patient to call the office. Prescription was sent to the pharmacy on 07/03/19. Patient should not be out of medication yet. We have also received a refill request on Celebrex. Refill request declined as patient is on prednisone and MTX.

## 2019-07-10 NOTE — Progress Notes (Signed)
Anesthesia Chart Review:   Case: [id number] Date/Time: 07/11/19 0915   Procedure: LAPAROSCOPIC VENTRAL HERNIA REPAIR (N/A )   Anesthesia type: General   Pre-op diagnosis: OSTOMY SITE HERNIA   [facility] ROOM 01 / [facility]   Surgeon: Luretha Murphy, MD      DISCUSSION:  Pt is a 73 year old female with hx CAD (DES to LAD 2010, mild stenosis in other vessels), HTN, heart  Murmur (unspecified; last cardiology note documents "1/6 systolic, soft"), rheumatoid arthritis  - Pt to stop methotrexate 1 week prior to surgery per rheumatology   VS: BP 113/63   Pulse 69   Temp 36.7 C (Oral)   Resp 16   Ht 5\' 3"  (1.6 m)   Wt 75.5 kg   SpO2 99%   BMI 29.49 kg/m    PROVIDERS: - PCP is , L.Clovis Riley, MD - Cardiologist is August Saucer, MD. Last office visit 05/22/19   LABS: Labs reviewed: Acceptable for surgery. (all labs ordered are listed, but only abnormal results are displayed)  Labs Reviewed  BASIC METABOLIC PANEL - Abnormal; Notable for the following components:      Result Value   Glucose, Bld 119 (*)    All other components within normal limits  CBC - Abnormal; Notable for the following components:   RBC 3.76 (*)    MCV 109.0 (*)    MCH 34.8 (*)    All other components within normal limits    EKG 07/09/19:  Sinus rhythm with Premature supraventricular complexes. LVH.  Possible Anterior infarct, age undetermined. Premature complexes new; otherwise EKG appears stable compared to baseline EKG from stress test 08/05/18   CV:  Nuclear stress test 08/05/18:  1. No reversible ischemia. 2. Normal left ventricular wall motion. 3. Left ventricular ejection fraction 64% 4. Non invasive risk stratification: Low   Cardiac cath 09/13/09:  - LM: long and patent - LAD: 50-85% mid stenosis. S/p DES to mid LAD. D1 small. D2 large with 10-20% proximal and mid stenosis. D3 small - Ramus small and patent - CX: 15-20% proximal and 20-25% mid stenosis. OM1 and OM2 are very small -  RCA: 15-20% mid and distal stenosis. PDA and PLV branches patent   Past Medical History:  Diagnosis Date  . Arthritis    rheumatoid , osteo   . CAD (coronary artery disease)    PCI to LAD 2010  . Diverticulitis    with perforation and colostomy placement    . Fibromyalgia   . Heart disease   . Heart murmur   . Hyperlipidemia   . Hypertension   . Myocardial infarction [facility]) 2008   one Stent  . Vitamin D deficiency   . Wears dentures    gum disease    Past Surgical History:  Procedure Laterality Date  . CESAREAN SECTION    . COLECTOMY WITH COLOSTOMY CREATION/HARTMANN PROCEDURE  09/2017   Perforated diverticulitis  . CORONARY STENT PLACEMENT  08/2009  . EYE SURGERY    . LAPAROSCOPIC GASTRIC BANDING  04/05/2009   Dr 06/05/2009  . PROCTOSCOPY N/A 10/09/2018   Procedure: RIDGID PROCTOSCOPY;  Surgeon: 10/11/2018, MD;  Location: [facility];  Service: General;  Laterality: N/A;  . VAGINAL HYSTERECTOMY      MEDICATIONS: . ALPRAZolam (XANAX) 0.5 MG tablet  . aspirin 81 MG tablet  . celecoxib (CELEBREX) 200 MG capsule  . cetirizine (ZYRTEC) 10 MG tablet  . Cholecalciferol (VITAMIN D3) 5000 units CAPS  . estropipate (OGEN) 0.75 MG tablet  .  famotidine (PEPCID) 20 MG tablet  . folic acid (FOLVITE) 1 MG tablet  . hydroxychloroquine (PLAQUENIL) 200 MG tablet  . levothyroxine (SYNTHROID) 25 MCG tablet  . Methotrexate Sodium (METHOTREXATE, PF,) 50 MG/2ML injection  . metoprolol succinate (TOPROL-XL) 25 MG 24 hr tablet  . Multiple Vitamin (MULTIVITAMIN WITH MINERALS) TABS tablet  . nitroGLYCERIN (NITROSTAT) 0.4 MG SL tablet  . olmesartan-hydrochlorothiazide (BENICAR HCT) 40-25 MG tablet  . predniSONE (DELTASONE) 5 MG tablet  . predniSONE (DELTASONE) 5 MG tablet  . predniSONE (DELTASONE) 5 MG tablet  . predniSONE (DELTASONE) 5 MG tablet  . PREMARIN 0.3 MG tablet  . sertraline (ZOLOFT) 50 MG tablet  . simvastatin (ZOCOR) 80 MG tablet  . traMADol (ULTRAM) 50 MG tablet  . TUBERCULIN  SYR 1CC/27GX1/2" (B-D TB SYRINGE 1CC/27GX1/2") 27G X 1/2" 1 ML MISC   No current facility-administered medications for this encounter.    Derrill Memo ON 07/11/2019] bupivacaine liposome (EXPAREL) 1.3 % injection 266 mg   - pt to stop methotrexate 1 week prior to surgery per Rheumatologist Dr. Estanislado Pandy   If no changes, I anticipate pt can proceed with surgery as scheduled.   Willeen Cass, FNP-BC [facility]/Anesthesiology Phone: [phone] 07/10/2019 10:21 AM

## 2019-07-10 NOTE — Progress Notes (Signed)
Office Visit Note  Patient: Vanessa Collins             Date of Birth: June 23, 1946           MRN: [id number]             PCP: Alroy Dust, L.Marlou Sa, MD Referring: Alroy Dust, Carlean Jews.Marlou Sa, MD Visit Date: 07/24/2019 Occupation: @GUAROCC @  Subjective:  Pain in multiple joints  History of Present Illness: Vanessa Collins is a 73 y.o. female with history of seropositive rheumatoid arthritis, fibromyalgia, and osteoarthritis. She is on MTX 0.6 ml sq once weekly, PLQ 200 mg 1 tablet po daily.  She takes celebrex 200 mg 1 tablet po daily for pain relief.  Patient states about 2 weeks ago she had abdominal hernia repair surgery.  She stopped methotrexate a week prior to that.  She is holding methotrexate to get clearance from the surgeon.  She has been having increased pain and discomfort in her bilateral hands, her feet and her ankles.  Activities of Daily Living:  Patient reports morning stiffness for 4 hours.   Patient Reports nocturnal pain.  Difficulty dressing/grooming: Denies Difficulty climbing stairs: Reports Difficulty getting out of chair: Reports Difficulty using hands for taps, buttons, cutlery, and/or writing: Reports  Review of Systems  Constitutional: Positive for fatigue. Negative for night sweats, weight gain and weight loss.  HENT: Negative for mouth sores, trouble swallowing, trouble swallowing, mouth dryness and nose dryness.   Eyes: Negative for pain, redness, visual disturbance and dryness.  Respiratory: Negative for cough, shortness of breath and difficulty breathing.   Cardiovascular: Negative for chest pain, palpitations, hypertension, irregular heartbeat and swelling in legs/feet.  Gastrointestinal: Negative for blood in stool, constipation and diarrhea.  Endocrine: Negative for increased urination.  Genitourinary: Negative for vaginal dryness.  Musculoskeletal: Positive for arthralgias, joint pain, joint swelling and morning stiffness. Negative for myalgias, muscle weakness, muscle  tenderness and myalgias.  Skin: Negative for color change, rash, hair loss, skin tightness, ulcers and sensitivity to sunlight.  Allergic/Immunologic: Negative for susceptible to infections.  Neurological: Negative for dizziness, memory loss, night sweats and weakness.  Hematological: Negative for swollen glands.  Psychiatric/Behavioral: Negative for depressed mood and sleep disturbance. The patient is not nervous/anxious.     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 10/09/2018 10/09/2018   History of depression 06/19/2017   Rheumatoid arthritis involving multiple sites with positive rheumatoid factor ([place]) 01/25/2017   High risk medication use 01/25/2017   Immunosuppression ([place]) 01/25/2017   Fibromyalgia 01/25/2017   Primary osteoarthritis of both hands 01/25/2017   Spondylosis of lumbar region without myelopathy or radiculopathy 01/25/2017   DJD (degenerative joint disease), cervical 01/25/2017   History of neutropenia 01/25/2017   History of coronary artery disease 01/25/2017   Lapband APS April 2010 02/25/2013   Wears dentures     Past Medical History:  Diagnosis Date   Arthritis    rheumatoid , osteo    CAD (coronary artery disease)    PCI to LAD 2010   Diverticulitis    with perforation and colostomy placement     Fibromyalgia    Heart disease    Heart murmur    Hyperlipidemia    Hypertension    Myocardial infarction ([place]) 2008   one  Stent   Vitamin D deficiency    Wears dentures    gum disease    Family History  Problem Relation Age of Onset   Macular degeneration Mother    Cancer Father    Rheum arthritis Father    Bipolar disorder Father    Bipolar disorder Daughter    Rheum  arthritis Daughter    Past Surgical History:  Procedure Laterality Date   CESAREAN SECTION     COLECTOMY WITH COLOSTOMY CREATION/HARTMANN PROCEDURE  09/2017   Perforated diverticulitis   CORONARY STENT PLACEMENT  08/2009   EYE SURGERY     LAPAROSCOPIC GASTRIC BANDING  04/05/2009   Dr Daphine Deutscher   PROCTOSCOPY N/A 10/09/2018   Procedure: RIDGID PROCTOSCOPY;  Surgeon: Karie Soda, MD;  [facility];  Service: General;  Laterality: N/A;   VAGINAL HYSTERECTOMY     VENTRAL HERNIA REPAIR N/A 07/11/2019   Procedure: LAPAROSCOPIC ASSISTED VENTRAL HERNIA REPAIR;  Surgeon: Luretha Murphy, MD;  Location[facility];  Service: General;  Laterality: N/A;   Social History   Social History Narrative   Not on file    There is no immunization history on file for this patient.   Objective: Vital Signs: BP 117/70 (BP Location: Left Arm, Patient Position: Sitting, Cuff Size: Normal)    Pulse 60    Resp 12    Ht 5\' 4"  (1.626 m)    Wt 165 lb 3.2 oz (74.9 kg)    BMI 28.36 kg/m    Physical Exam Vitals signs and nursing note reviewed.  Constitutional:      Appearance: She is well-developed.  HENT:     Head: Normocephalic and atraumatic.  Eyes:     Conjunctiva/sclera: Conjunctivae normal.  Neck:     Musculoskeletal: Normal range of motion.  Cardiovascular:     Rate and Rhythm: Normal rate and regular rhythm.     Heart sounds: Normal heart sounds.  Pulmonary:     Effort: Pulmonary effort is normal.     Breath sounds: Normal breath sounds.  Abdominal:     General: Bowel sounds are normal.     Palpations: Abdomen is soft.  Lymphadenopathy:     Cervical: No cervical adenopathy.  Skin:    General: Skin is warm and dry.     Capillary Refill: Capillary refill takes less than 2 seconds.  Neurological:     Mental Status: She is alert and oriented to person, place, and time.  Psychiatric:        Behavior: Behavior normal.      Musculoskeletal Exam: C-spine was in good range of motion.   Shoulder joints elbow joints wrist joints MCPs PIPs DIPs with good range of motion with no synovitis.  She has some synovial thickening over MCP joints.  Knee joints with good range of motion with some discomfort.  She had discomfort over her ankle joints but I could not appreciate synovitis.  CDAI Exam: CDAI Score: 8.1  Patient Global: 8 mm; Provider Global: 3 mm Swollen: 0 ; Tender: 9  Joint Exam      Right  Left  PIP 2   Tender   Tender  PIP 3   Tender     PIP 4   Tender   Tender  Knee   Tender   Tender  Ankle   Tender   Tender     Investigation: No additional findings.  Imaging: No results found.  Recent Labs: Lab Results  Component Value Date   WBC 6.3 07/11/2019  HGB 11.9 (L) 07/11/2019   PLT 187 07/11/2019   NA 141 07/09/2019   K 3.9 07/09/2019   CL 102 07/09/2019   CO2 30 07/09/2019   GLUCOSE 119 (H) 07/09/2019   BUN 19 07/09/2019   CREATININE 1.16 (H) 07/11/2019   BILITOT 0.5 04/16/2019   ALKPHOS 33 06/20/2017   AST 25 04/16/2019   ALT 25 04/16/2019   PROT 6.4 04/16/2019   ALBUMIN 4.1 06/20/2017   CALCIUM 9.3 07/09/2019   GFRAA 55 (L) 07/11/2019    Speciality Comments: Prior therapy includes: Harriette Ohara (GI perforation), Humira (inadequate response), Enbrel (inadequate response), and Orencia (nausea and headaches)  Procedures:  No procedures performed Allergies: Vicodin [hydrocodone-acetaminophen] and Aspirin   Assessment / Plan:     Visit Diagnoses: Rheumatoid arthritis involving multiple sites with positive rheumatoid factor (HCC) - Positive RF, positive anti-CCP. Harriette Ohara was discontinued after colon rupture: -She continues to have pain and discomfort in multiple joints.  I do not appreciate synovitis but she had tenderness.  She is currently on methotrexate and Plaquenil combination along with prednisone.  She is holding methotrexate due to recent hernia repair.  I have advised her to resume methotrexate after she gets approval from her surgeon.  She  wants to increase prednisone to 10 mg p.o. daily.  Have discouraged increasing prednisone at this time.  High risk medication use -  (MTX 0.6 mL sq q wk, folic acid 2 mg by mouth daily, prednisone 5 mg by mouth daily (off xeljanz due to colon rupture, has colostomy in place -her creatinine has been elevated.  I believe it is due to the use of Celebrex in combination with methotrexate.  Plan: BASIC METABOLIC PANEL WITH GFR,   Fibromyalgia -  She has chronic fatigue. She takes Celebrex and tramadol for pain relief.  Patient is a lot of discomfort.  I have advised her to discuss modifying her medications with pain management clinic.  Primary osteoarthritis of both hands -she has DIP and PIP thickening which causes discomfort.  She also has CMC arthritis.  DDD (degenerative disc disease), cervical -she has chronic pain.  DDD (degenerative disc disease), lumbar -she continues to hurt in her lower back.  Vitamin D deficiency -she is taking vitamin D supplement.  Status post colostomy (HCC)   History of hypertension -blood pressure is under control.  History of depression -situational  History of coronary artery disease   Lapband APS April 2010   Orders: Orders Placed This Encounter  Procedures   BASIC METABOLIC PANEL WITH GFR   No orders of the defined types were placed in this encounter.   Face-to-face time spent with patient was 30 minutes. Greater than 50% of time was spent in counseling and coordination of care.  Follow-Up Instructions: Return in about 4 months (around 11/24/2019) for Rheumatoid arthritis, Osteoarthritis,FMS.   Pollyann Savoy, MD  Note - This record has been created using Animal nutritionist.  Chart creation errors have been sought, but may not always  have been located. Such creation errors do not reflect on  the standard of medical care.

## 2019-07-10 NOTE — H&P (Signed)
Chief Complaint:  Ventral hernia at site of ostomy  History of Present Illness:  Vanessa Collins is an 73 y.o. female who had a colostomy closure and has a ventral hernia at the site.  She presents for lap assisted repair of ventral hernia.    Past Medical History:  Diagnosis Date  . Arthritis    rheumatoid , osteo   . CAD (coronary artery disease)    PCI to LAD 2010  . Diverticulitis    with perforation and colostomy placement    . Fibromyalgia   . Heart disease   . Heart murmur   . Hyperlipidemia   . Hypertension   . Myocardial infarction [facility]) 2008   one Stent  . Vitamin D deficiency   . Wears dentures    gum disease    Past Surgical History:  Procedure Laterality Date  . CESAREAN SECTION    . COLECTOMY WITH COLOSTOMY CREATION/HARTMANN PROCEDURE  09/2017   Perforated diverticulitis  . CORONARY STENT PLACEMENT  08/2009  . EYE SURGERY    . LAPAROSCOPIC GASTRIC BANDING  04/05/2009   Dr Daphine Deutscher  . PROCTOSCOPY N/A 10/09/2018   Procedure: RIDGID PROCTOSCOPY;  Surgeon: Karie Soda, MD;  Location: [facility];  Service: General;  Laterality: N/A;  . VAGINAL HYSTERECTOMY      Current Facility-Administered Medications  Medication Dose Route Frequency Provider Last Rate Last Dose  . [START ON 07/11/2019] bupivacaine liposome (EXPAREL) 1.3 % injection 266 mg  20 mL Infiltration On Call to OR Luretha Murphy, MD       Current Outpatient Medications  Medication Sig Dispense Refill  . ALPRAZolam (XANAX) 0.5 MG tablet Take 1 mg by mouth at bedtime.     Marland Kitchen aspirin 81 MG tablet Take 81 mg by mouth daily.    . celecoxib (CELEBREX) 200 MG capsule TAKE 1 CAPSULE BY MOUTH EVERY DAY WITH FOOD (Patient taking differently: Take 200 mg by mouth daily. ) 30 capsule 2  . cetirizine (ZYRTEC) 10 MG tablet Take 10 mg by mouth at bedtime.     . Cholecalciferol (VITAMIN D3) 5000 units CAPS Take 5,000 Units by mouth daily.     Marland Kitchen estropipate (OGEN) 0.75 MG tablet Take 0.75 mg by mouth daily.  0  . famotidine  (PEPCID) 20 MG tablet Take 20 mg by mouth 2 (two) times daily.    . folic acid (FOLVITE) 1 MG tablet TAKE 2 TABLETS BY MOUTH EVERY DAY (Patient taking differently: Take 1 mg by mouth 2 (two) times a day. ) 180 tablet 3  . hydroxychloroquine (PLAQUENIL) 200 MG tablet Take 1 tablet (200 mg total) by mouth daily. 60 tablet 2  . levothyroxine (SYNTHROID) 25 MCG tablet Take 25 mcg by mouth daily before breakfast.    . Methotrexate Sodium (METHOTREXATE, PF,) 50 MG/2ML injection INJECT 0.6 mls into SKIN ONCE WEEKLY. (Patient taking differently: Inject 15 mg into the vein every 7 (seven) days. ) 8 mL 0  . metoprolol succinate (TOPROL-XL) 25 MG 24 hr tablet Take 25 mg by mouth every evening. Take with or immediately following a meal.     . Multiple Vitamin (MULTIVITAMIN WITH MINERALS) TABS tablet Take 1 tablet by mouth daily.    . nitroGLYCERIN (NITROSTAT) 0.4 MG SL tablet Place 0.4 mg under the tongue every 5 (five) minutes as needed for chest pain.     Marland Kitchen olmesartan-hydrochlorothiazide (BENICAR HCT) 40-25 MG tablet Take 0.5 tablets by mouth daily.   3  . predniSONE (DELTASONE) 5 MG tablet TAKE  1 TABLET BY MOUTH EVERY DAY AS DIRECTED (Patient taking differently: Take 5 mg by mouth daily with breakfast. ) 90 tablet 0  . predniSONE (DELTASONE) 5 MG tablet Take 15 mg po x 2 days, 10 mg po x 2 days then 5 mg daily 40 tablet 0  . PREMARIN 0.3 MG tablet Take 0.3 mg by mouth daily.  12  . sertraline (ZOLOFT) 50 MG tablet Take 50 mg by mouth daily.     . simvastatin (ZOCOR) 80 MG tablet Take 80 mg by mouth every evening.     . traMADol (ULTRAM) 50 MG tablet Take 100 mg by mouth 4 (four) times daily.     . predniSONE (DELTASONE) 5 MG tablet Take 4 tabs po x 4 days, 3  tabs po x 4 days, 2  tabs po x 4 days, 1  tab po x 4 days (Patient not taking: Reported on 07/04/2019) 40 tablet 0  . predniSONE (DELTASONE) 5 MG tablet Take 4 tabs po x 4 days, 3  tabs po x 4 days, 2  tabs po x 4 days, 1  tab po x 4 days (Patient not  taking: Reported on 07/04/2019) 40 tablet 0  . TUBERCULIN SYR 1CC/27GX1/2" (B-D TB SYRINGE 1CC/27GX1/2") 27G X 1/2" 1 ML MISC Patient to inject methotrexate weekly. 12 each 3   Aspirin Family History  Problem Relation Age of Onset  . Macular degeneration Mother   . Cancer Father   . Rheum arthritis Father   . Bipolar disorder Father   . Bipolar disorder Daughter   . Rheum arthritis Daughter    Social History:   reports that she has quit smoking. She has never used smokeless tobacco. She reports that she does not drink alcohol or use drugs.   REVIEW OF SYSTEMS : Negative except for see problem list  Physical Exam:   There were no vitals taken for this visit. There is no height or weight on file to calculate BMI.  Gen:  WDWN WF NAD  Neurological: Alert and oriented to person, place, and time. Motor and sensory function is grossly intact  Head: Normocephalic and atraumatic.  Eyes: Conjunctivae are normal. Pupils are equal, round, and reactive to light. No scleral icterus.  Neck: Normal range of motion. Neck supple. No tracheal deviation or thyromegaly present.  Cardiovascular:  SR without murmurs or gallops.  No carotid bruits Breast:  Not examined Respiratory: Effort normal.  No respiratory distress. No chest wall tenderness. Breath sounds normal.  No wheezes, rales or rhonchi.  Abdomen:  Hernia in the LLQ at site of colostomy GU:  Not examined Musculoskeletal: Normal range of motion. Extremities are nontender. No cyanosis, edema or clubbing noted Lymphadenopathy: No cervical, preauricular, postauricular or axillary adenopathy is present Skin: Skin is warm and dry. No rash noted. No diaphoresis. No erythema. No pallor. Pscyh: Normal mood and affect. Behavior is normal. Judgment and thought content normal.   LABORATORY RESULTS: Results for orders placed or performed during the hospital encounter of 07/09/19 (from the past 48 hour(s))  Basic metabolic panel     Status: Abnormal    Collection Time: 07/09/19  4:24 PM  Result Value Ref Range   Sodium 141 135 - 145 mmol/L   Potassium 3.9 3.5 - 5.1 mmol/L   Chloride 102 98 - 111 mmol/L   CO2 30 22 - 32 mmol/L   Glucose, Bld 119 (H) 70 - 99 mg/dL   BUN 19 8 - 23 mg/dL   Creatinine, Ser 0.92  0.44 - 1.00 mg/dL   Calcium 9.3 8.9 - 16.110.3 mg/dL   GFR calc non Af Amer >60 >60 mL/min   GFR calc Af Amer >60 >60 mL/min   Anion gap 9 5 - 15    Comment: Performed at [facility], [address] [place], [place] [address]  CBC     Status: Abnormal   Collection Time: 07/09/19  4:24 PM  Result Value Ref Range   WBC 6.9 4.0 - 10.5 K/uL   RBC 3.76 (L) 3.87 - 5.11 MIL/uL   Hemoglobin 13.1 12.0 - 15.0 g/dL   HCT 54.041.0 98.136.0 - 19.146.0 %   MCV 109.0 (H) 80.0 - 100.0 fL   MCH 34.8 (H) 26.0 - 34.0 pg   MCHC 32.0 30.0 - 36.0 g/dL   RDW 47.814.4 29.511.5 - 62.115.5 %   Platelets 225 150 - 400 K/uL   nRBC 0.0 0.0 - 0.2 %    Comment: Performed at [facility], [address] [place], [place] [address]     RADIOLOGY RESULTS: No results found.  Problem List: Patient Active Problem List   Diagnosis Date Noted  . Hypertension   . Hyperlipidemia   . Diverticulitis of rectosigmoid s/p robotic LAR/colostomy takedown 10/09/2018 10/09/2018  . 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 History of depression 06/19/2017  . Rheumatoid arthritis involving multiple sites with positive rheumatoid factor (HCC) 01/25/2017  . High risk medication use 01/25/2017  . Immunosuppression (HCC) 01/25/2017  . Fibromyalgia 01/25/2017  . Primary osteoarthritis of both hands 01/25/2017  . Spondylosis of lumbar region without myelopathy or radiculopathy 01/25/2017  . DJD (degenerative joint disease), cervical 01/25/2017  . History of neutropenia 01/25/2017  . History of coronary artery disease 01/25/2017  . 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 Wears dentures     Assessment & Plan: Ventral  hernia at colostomy site.      Matt B. Daphine DeutscherMartin, MD, [facility]  [facility] [phone] beeper [phone]  07/10/2019 7:24 PM

## 2019-07-11 ENCOUNTER — Ambulatory Visit (HOSPITAL_COMMUNITY): Payer: BC Managed Care – PPO | Admitting: Certified Registered"

## 2019-07-11 ENCOUNTER — Encounter (HOSPITAL_COMMUNITY): Payer: Self-pay | Admitting: Emergency Medicine

## 2019-07-11 ENCOUNTER — Encounter (HOSPITAL_COMMUNITY)
Admission: AD | Disposition: A | Discharge status: Home / Self Care | Payer: Self-pay | Source: Home / Self Care | Attending: Surgery

## 2019-07-11 ENCOUNTER — Other Ambulatory Visit: Payer: Self-pay

## 2019-07-11 ENCOUNTER — Ambulatory Visit (HOSPITAL_COMMUNITY): Payer: BC Managed Care – PPO | Admitting: Emergency Medicine

## 2019-07-11 ENCOUNTER — Observation Stay (HOSPITAL_COMMUNITY)
Admission: AD | Admit: 2019-07-11 | Discharge: 2019-07-12 | Disposition: A | Discharge status: Home / Self Care | Payer: BC Managed Care – PPO | Attending: Surgery | Admitting: Surgery

## 2019-07-11 DIAGNOSIS — Z955 Presence of coronary angioplasty implant and graft: Secondary | ICD-10-CM | POA: Insufficient documentation

## 2019-07-11 DIAGNOSIS — K430 Incisional hernia with obstruction, without gangrene: Principal | ICD-10-CM | POA: Insufficient documentation

## 2019-07-11 DIAGNOSIS — Z9884 Bariatric surgery status: Secondary | ICD-10-CM | POA: Insufficient documentation

## 2019-07-11 DIAGNOSIS — E559 Vitamin D deficiency, unspecified: Secondary | ICD-10-CM | POA: Diagnosis not present

## 2019-07-11 DIAGNOSIS — Z7982 Long term (current) use of aspirin: Secondary | ICD-10-CM | POA: Diagnosis not present

## 2019-07-11 DIAGNOSIS — I2510 Atherosclerotic heart disease of native coronary artery without angina pectoris: Secondary | ICD-10-CM | POA: Diagnosis not present

## 2019-07-11 DIAGNOSIS — Z79890 Hormone replacement therapy (postmenopausal): Secondary | ICD-10-CM | POA: Insufficient documentation

## 2019-07-11 DIAGNOSIS — Z7952 Long term (current) use of systemic steroids: Secondary | ICD-10-CM | POA: Diagnosis not present

## 2019-07-11 DIAGNOSIS — M47816 Spondylosis without myelopathy or radiculopathy, lumbar region: Secondary | ICD-10-CM | POA: Diagnosis not present

## 2019-07-11 DIAGNOSIS — M0579 Rheumatoid arthritis with rheumatoid factor of multiple sites without organ or systems involvement: Secondary | ICD-10-CM | POA: Insufficient documentation

## 2019-07-11 DIAGNOSIS — Z791 Long term (current) use of non-steroidal anti-inflammatories (NSAID): Secondary | ICD-10-CM | POA: Insufficient documentation

## 2019-07-11 DIAGNOSIS — I10 Essential (primary) hypertension: Secondary | ICD-10-CM | POA: Diagnosis not present

## 2019-07-11 DIAGNOSIS — Z87891 Personal history of nicotine dependence: Secondary | ICD-10-CM | POA: Diagnosis not present

## 2019-07-11 DIAGNOSIS — Z8719 Personal history of other diseases of the digestive system: Secondary | ICD-10-CM

## 2019-07-11 DIAGNOSIS — Z79899 Other long term (current) drug therapy: Secondary | ICD-10-CM | POA: Diagnosis not present

## 2019-07-11 DIAGNOSIS — K435 Parastomal hernia without obstruction or  gangrene: Secondary | ICD-10-CM | POA: Insufficient documentation

## 2019-07-11 DIAGNOSIS — E785 Hyperlipidemia, unspecified: Secondary | ICD-10-CM | POA: Diagnosis not present

## 2019-07-11 DIAGNOSIS — I252 Old myocardial infarction: Secondary | ICD-10-CM | POA: Insufficient documentation

## 2019-07-11 DIAGNOSIS — Z9889 Other specified postprocedural states: Secondary | ICD-10-CM

## 2019-07-11 DIAGNOSIS — M797 Fibromyalgia: Secondary | ICD-10-CM | POA: Insufficient documentation

## 2019-07-11 HISTORY — PX: VENTRAL HERNIA REPAIR: SHX424

## 2019-07-11 LAB — CREATININE, SERUM
Creatinine, Ser: 1.16 mg/dL — ABNORMAL HIGH (ref 0.44–1.00)
GFR calc Af Amer: 55 mL/min — ABNORMAL LOW (ref >60)
GFR calc non Af Amer: 47 mL/min — ABNORMAL LOW (ref >60)

## 2019-07-11 LAB — CBC
HCT: 38 % (ref 36.0–46.0)
Hemoglobin: 11.9 g/dL — ABNORMAL LOW (ref 12.0–15.0)
MCH: 34.3 pg — ABNORMAL HIGH (ref 26.0–34.0)
MCHC: 31.3 g/dL (ref 30.0–36.0)
MCV: 109.5 fL — ABNORMAL HIGH (ref 80.0–100.0)
Platelets: 187 10*3/uL (ref 150–400)
RBC: 3.47 MIL/uL — ABNORMAL LOW (ref 3.87–5.11)
RDW: 14.4 % (ref 11.5–15.5)
WBC: 6.3 10*3/uL (ref 4.0–10.5)
nRBC: 0 % (ref 0.0–0.2)

## 2019-07-11 SURGERY — REPAIR, HERNIA, VENTRAL, LAPAROSCOPIC
Anesthesia: General | Site: Abdomen

## 2019-07-11 MED ORDER — SERTRALINE HCL 50 MG PO TABS: 50.0000 mg | ORAL_TABLET | Freq: Every day | ORAL | Status: DC

## 2019-07-11 MED ORDER — PROPOFOL 10 MG/ML IV BOLUS
INTRAVENOUS | Status: AC
  Filled 2019-07-11: qty 20

## 2019-07-11 MED ORDER — CHLORHEXIDINE GLUCONATE CLOTH 2 % EX PADS: 6.0000 | MEDICATED_PAD | Freq: Once | CUTANEOUS | Status: DC

## 2019-07-11 MED ORDER — SUCCINYLCHOLINE CHLORIDE 200 MG/10ML IV SOSY
PREFILLED_SYRINGE | INTRAVENOUS | Status: AC
  Filled 2019-07-11: qty 10

## 2019-07-11 MED ORDER — FENTANYL CITRATE (PF) 100 MCG/2ML IJ SOLN
12.5000 ug | INTRAMUSCULAR | Status: DC | PRN
  Administered 2019-07-11 – 2019-07-12 (×3): 12.5 ug via INTRAVENOUS
  Filled 2019-07-11 (×3): qty 2

## 2019-07-11 MED ORDER — HYDROXYCHLOROQUINE SULFATE 200 MG PO TABS
200.0000 mg | ORAL_TABLET | Freq: Every day | ORAL | Status: DC
  Administered 2019-07-11: 200 mg via ORAL
  Filled 2019-07-11 (×2): qty 1

## 2019-07-11 MED ORDER — DEXAMETHASONE SODIUM PHOSPHATE 10 MG/ML IJ SOLN
INTRAMUSCULAR | Status: AC
  Filled 2019-07-11: qty 1

## 2019-07-11 MED ORDER — HEPARIN SODIUM (PORCINE) 5000 UNIT/ML IJ SOLN
5000.0000 [IU] | Freq: Once | INTRAMUSCULAR | Status: AC
  Administered 2019-07-11: 5000 [IU] via SUBCUTANEOUS
  Filled 2019-07-11: qty 1

## 2019-07-11 MED ORDER — DEXAMETHASONE SODIUM PHOSPHATE 10 MG/ML IJ SOLN
INTRAMUSCULAR | Status: DC | PRN
  Administered 2019-07-11: 10 mg via INTRAVENOUS

## 2019-07-11 MED ORDER — FENTANYL CITRATE (PF) 100 MCG/2ML IJ SOLN
INTRAMUSCULAR | Status: AC
  Filled 2019-07-11: qty 2

## 2019-07-11 MED ORDER — CEFAZOLIN SODIUM-DEXTROSE 2-4 GM/100ML-% IV SOLN
2.0000 g | Freq: Three times a day (TID) | INTRAVENOUS | Status: AC
  Administered 2019-07-11: 2 g via INTRAVENOUS
  Filled 2019-07-11: qty 100

## 2019-07-11 MED ORDER — EPHEDRINE 5 MG/ML INJ
INTRAVENOUS | Status: AC
  Filled 2019-07-11: qty 10

## 2019-07-11 MED ORDER — SUCCINYLCHOLINE CHLORIDE 200 MG/10ML IV SOSY
PREFILLED_SYRINGE | INTRAVENOUS | Status: DC | PRN
  Administered 2019-07-11: 100 mg via INTRAVENOUS

## 2019-07-11 MED ORDER — ONDANSETRON HCL 4 MG/2ML IJ SOLN: 4.0000 mg | Freq: Once | INTRAMUSCULAR | Status: DC | PRN

## 2019-07-11 MED ORDER — ONDANSETRON 4 MG PO TBDP: 4.0000 mg | ORAL_TABLET | Freq: Four times a day (QID) | ORAL | Status: DC | PRN

## 2019-07-11 MED ORDER — LIDOCAINE 2% (20 MG/ML) 5 ML SYRINGE
INTRAMUSCULAR | Status: AC
  Filled 2019-07-11: qty 5

## 2019-07-11 MED ORDER — LACTATED RINGERS IV SOLN
INTRAVENOUS | Status: DC
  Administered 2019-07-11 (×2): via INTRAVENOUS

## 2019-07-11 MED ORDER — SUGAMMADEX SODIUM 200 MG/2ML IV SOLN
INTRAVENOUS | Status: DC | PRN
  Administered 2019-07-11: 200 mg via INTRAVENOUS

## 2019-07-11 MED ORDER — PHENYLEPHRINE 40 MCG/ML (10ML) SYRINGE FOR IV PUSH (FOR BLOOD PRESSURE SUPPORT)
PREFILLED_SYRINGE | INTRAVENOUS | Status: AC
  Filled 2019-07-11: qty 10

## 2019-07-11 MED ORDER — CELECOXIB 200 MG PO CAPS
200.0000 mg | ORAL_CAPSULE | Freq: Every day | ORAL | Status: DC
  Administered 2019-07-11: 200 mg via ORAL
  Filled 2019-07-11: qty 1

## 2019-07-11 MED ORDER — SODIUM CHLORIDE (PF) 0.9 % IJ SOLN
INTRAMUSCULAR | Status: DC | PRN
  Administered 2019-07-11: 10 mL

## 2019-07-11 MED ORDER — IRBESARTAN 300 MG PO TABS: 300.0000 mg | ORAL_TABLET | Freq: Every day | ORAL | Status: DC

## 2019-07-11 MED ORDER — MIDAZOLAM HCL 2 MG/2ML IJ SOLN
INTRAMUSCULAR | Status: AC
  Filled 2019-07-11: qty 2

## 2019-07-11 MED ORDER — ROCURONIUM BROMIDE 10 MG/ML (PF) SYRINGE
PREFILLED_SYRINGE | INTRAVENOUS | Status: AC
  Filled 2019-07-11: qty 10

## 2019-07-11 MED ORDER — ONDANSETRON HCL 4 MG/2ML IJ SOLN
INTRAMUSCULAR | Status: AC
  Filled 2019-07-11: qty 2

## 2019-07-11 MED ORDER — LIDOCAINE 20MG/ML (2%) 15 ML SYRINGE OPTIME
INTRAMUSCULAR | Status: DC | PRN
  Administered 2019-07-11: 1.5 mg/kg/h via INTRAVENOUS

## 2019-07-11 MED ORDER — FOLIC ACID 1 MG PO TABS
1.0000 mg | ORAL_TABLET | Freq: Two times a day (BID) | ORAL | Status: DC
  Administered 2019-07-11 – 2019-07-12 (×3): 1 mg via ORAL
  Filled 2019-07-11 (×3): qty 1

## 2019-07-11 MED ORDER — HYDROCHLOROTHIAZIDE 25 MG PO TABS: 25.0000 mg | ORAL_TABLET | Freq: Every day | ORAL | Status: DC

## 2019-07-11 MED ORDER — ONDANSETRON HCL 4 MG/2ML IJ SOLN
INTRAMUSCULAR | Status: DC | PRN
  Administered 2019-07-11: 4 mg via INTRAVENOUS

## 2019-07-11 MED ORDER — ESTROPIPATE 1.5 MG PO TABS
0.7500 mg | ORAL_TABLET | Freq: Every day | ORAL | Status: DC
  Filled 2019-07-11: qty 0.5

## 2019-07-11 MED ORDER — PHENYLEPHRINE 40 MCG/ML (10ML) SYRINGE FOR IV PUSH (FOR BLOOD PRESSURE SUPPORT)
PREFILLED_SYRINGE | INTRAVENOUS | Status: DC | PRN
  Administered 2019-07-11: 80 ug via INTRAVENOUS

## 2019-07-11 MED ORDER — FENTANYL CITRATE (PF) 100 MCG/2ML IJ SOLN
INTRAMUSCULAR | Status: DC | PRN
  Administered 2019-07-11: 50 ug via INTRAVENOUS
  Administered 2019-07-11: 25 ug via INTRAVENOUS
  Administered 2019-07-11: 50 ug via INTRAVENOUS
  Administered 2019-07-11: 25 ug via INTRAVENOUS
  Administered 2019-07-11 (×2): 50 ug via INTRAVENOUS
  Administered 2019-07-11: 100 ug via INTRAVENOUS

## 2019-07-11 MED ORDER — 0.9 % SODIUM CHLORIDE (POUR BTL) OPTIME
TOPICAL | Status: DC | PRN
  Administered 2019-07-11: 1000 mL

## 2019-07-11 MED ORDER — PREDNISONE 5 MG PO TABS
5.0000 mg | ORAL_TABLET | Freq: Every day | ORAL | Status: DC
  Administered 2019-07-12: 5 mg via ORAL
  Filled 2019-07-11: qty 1

## 2019-07-11 MED ORDER — EPHEDRINE SULFATE-NACL 50-0.9 MG/10ML-% IV SOSY
PREFILLED_SYRINGE | INTRAVENOUS | Status: DC | PRN
  Administered 2019-07-11: 5 mg via INTRAVENOUS
  Administered 2019-07-11: 10 mg via INTRAVENOUS
  Administered 2019-07-11 (×4): 5 mg via INTRAVENOUS

## 2019-07-11 MED ORDER — FENTANYL CITRATE (PF) 250 MCG/5ML IJ SOLN
INTRAMUSCULAR | Status: AC
  Filled 2019-07-11: qty 5

## 2019-07-11 MED ORDER — HEPARIN SODIUM (PORCINE) 5000 UNIT/ML IJ SOLN
5000.0000 [IU] | Freq: Three times a day (TID) | INTRAMUSCULAR | Status: DC
  Administered 2019-07-11 – 2019-07-12 (×2): 5000 [IU] via SUBCUTANEOUS
  Filled 2019-07-11 (×2): qty 1

## 2019-07-11 MED ORDER — KCL IN DEXTROSE-NACL 20-5-0.45 MEQ/L-%-% IV SOLN
INTRAVENOUS | Status: DC
  Administered 2019-07-11: 75 mL/h via INTRAVENOUS
  Filled 2019-07-11: qty 1000

## 2019-07-11 MED ORDER — FENTANYL CITRATE (PF) 100 MCG/2ML IJ SOLN
25.0000 ug | INTRAMUSCULAR | Status: DC | PRN
  Administered 2019-07-11: 50 ug via INTRAVENOUS

## 2019-07-11 MED ORDER — PROPOFOL 10 MG/ML IV BOLUS
INTRAVENOUS | Status: DC | PRN
  Administered 2019-07-11: 130 mg via INTRAVENOUS

## 2019-07-11 MED ORDER — METOPROLOL SUCCINATE ER 25 MG PO TB24
25.0000 mg | ORAL_TABLET | Freq: Every evening | ORAL | Status: DC
  Administered 2019-07-11: 25 mg via ORAL
  Filled 2019-07-11: qty 1

## 2019-07-11 MED ORDER — NITROGLYCERIN 0.4 MG SL SUBL: 0.4000 mg | SUBLINGUAL_TABLET | SUBLINGUAL | Status: DC | PRN

## 2019-07-11 MED ORDER — FAMOTIDINE 20 MG PO TABS
20.0000 mg | ORAL_TABLET | Freq: Two times a day (BID) | ORAL | Status: DC
  Administered 2019-07-11: 20 mg via ORAL
  Filled 2019-07-11: qty 1

## 2019-07-11 MED ORDER — LIDOCAINE 2% (20 MG/ML) 5 ML SYRINGE
INTRAMUSCULAR | Status: DC | PRN
  Administered 2019-07-11: 80 mg via INTRAVENOUS

## 2019-07-11 MED ORDER — SODIUM CHLORIDE 0.9 % IV SOLN
2.0000 g | INTRAVENOUS | Status: AC
  Administered 2019-07-11: 2 g via INTRAVENOUS
  Filled 2019-07-11: qty 2

## 2019-07-11 MED ORDER — ALPRAZOLAM 1 MG PO TABS
1.0000 mg | ORAL_TABLET | Freq: Every day | ORAL | Status: DC
  Administered 2019-07-11: 1 mg via ORAL
  Filled 2019-07-11: qty 1

## 2019-07-11 MED ORDER — ROCURONIUM BROMIDE 10 MG/ML (PF) SYRINGE
PREFILLED_SYRINGE | INTRAVENOUS | Status: DC | PRN
  Administered 2019-07-11: 20 mg via INTRAVENOUS
  Administered 2019-07-11: 40 mg via INTRAVENOUS
  Administered 2019-07-11: 5 mg via INTRAVENOUS

## 2019-07-11 MED ORDER — PROPOFOL 10 MG/ML IV BOLUS
INTRAVENOUS | Status: AC
  Filled 2019-07-11: qty 40

## 2019-07-11 MED ORDER — BUPIVACAINE LIPOSOME 1.3 % IJ SUSP
INTRAMUSCULAR | Status: DC | PRN
  Administered 2019-07-11: 20 mL

## 2019-07-11 MED ORDER — OLMESARTAN MEDOXOMIL-HCTZ 40-25 MG PO TABS: 0.5000 | ORAL_TABLET | Freq: Every day | ORAL | Status: DC

## 2019-07-11 MED ORDER — GABAPENTIN 300 MG PO CAPS
300.0000 mg | ORAL_CAPSULE | ORAL | Status: AC
  Administered 2019-07-11: 300 mg via ORAL
  Filled 2019-07-11: qty 1

## 2019-07-11 MED ORDER — LEVOTHYROXINE SODIUM 25 MCG PO TABS
25.0000 ug | ORAL_TABLET | Freq: Every day | ORAL | Status: DC
  Administered 2019-07-12: 25 ug via ORAL
  Filled 2019-07-11: qty 1

## 2019-07-11 MED ORDER — ACETAMINOPHEN 500 MG PO TABS
1000.0000 mg | ORAL_TABLET | Freq: Once | ORAL | Status: AC
  Administered 2019-07-11: 1000 mg via ORAL
  Filled 2019-07-11: qty 2

## 2019-07-11 MED ORDER — HYDRALAZINE HCL 20 MG/ML IJ SOLN: 10.0000 mg | INTRAMUSCULAR | Status: DC | PRN

## 2019-07-11 MED ORDER — TRAMADOL HCL 50 MG PO TABS
100.0000 mg | ORAL_TABLET | Freq: Four times a day (QID) | ORAL | Status: DC
  Administered 2019-07-11 – 2019-07-12 (×4): 100 mg via ORAL
  Filled 2019-07-11 (×4): qty 2

## 2019-07-11 MED ORDER — ONDANSETRON HCL 4 MG/2ML IJ SOLN: 4.0000 mg | Freq: Four times a day (QID) | INTRAMUSCULAR | Status: DC | PRN

## 2019-07-11 SURGICAL SUPPLY — 35 items
BINDER ABDOMINAL 12 ML 46-62 (SOFTGOODS) ×2 IMPLANT
CABLE HIGH FREQUENCY MONO STRZ (ELECTRODE) ×1 IMPLANT
CHLORAPREP W/TINT 26 (MISCELLANEOUS) ×1 IMPLANT
COVER SURGICAL LIGHT HANDLE (MISCELLANEOUS) ×3 IMPLANT
COVER WAND RF STERILE (DRAPES) ×2 IMPLANT
DECANTER SPIKE VIAL GLASS SM (MISCELLANEOUS) ×3 IMPLANT
DERMABOND ADVANCED (GAUZE/BANDAGES/DRESSINGS) ×2
DERMABOND ADVANCED .7 DNX12 (GAUZE/BANDAGES/DRESSINGS) ×1 IMPLANT
DEVICE SECURE STRAP 25 ABSORB (INSTRUMENTS) ×2 IMPLANT
DEVICE TROCAR PUNCTURE CLOSURE (ENDOMECHANICALS) ×3 IMPLANT
DISSECTOR BLUNT TIP ENDO 5MM (MISCELLANEOUS) IMPLANT
ELECT PENCIL ROCKER SW 15FT (MISCELLANEOUS) ×3 IMPLANT
ELECT REM PT RETURN 15FT ADLT (MISCELLANEOUS) ×3 IMPLANT
GLOVE BIOGEL M 8.0 STRL (GLOVE) ×3 IMPLANT
GOWN STRL REUS W/TWL XL LVL3 (GOWN DISPOSABLE) ×9 IMPLANT
KIT BASIN OR (CUSTOM PROCEDURE TRAY) ×3 IMPLANT
KIT TURNOVER KIT A (KITS) IMPLANT
MARKER SKIN DUAL TIP RULER LAB (MISCELLANEOUS) ×3 IMPLANT
MESH VENT ST 11.4CM L CIR (Mesh General) ×2 IMPLANT
NDL SPNL 22GX3.5 QUINCKE BK (NEEDLE) ×1 IMPLANT
NEEDLE SPNL 22GX3.5 QUINCKE BK (NEEDLE) ×3 IMPLANT
SCRUB TECHNI CARE 4 OZ NO DYE (MISCELLANEOUS) ×3 IMPLANT
SET IRRIG TUBING LAPAROSCOPIC (IRRIGATION / IRRIGATOR) IMPLANT
SET TUBE SMOKE EVAC HIGH FLOW (TUBING) ×3 IMPLANT
SHEARS HARMONIC ACE PLUS 45CM (MISCELLANEOUS) ×2 IMPLANT
SLEEVE XCEL OPT CAN 5 100 (ENDOMECHANICALS) ×6 IMPLANT
STAPLER VISISTAT 35W (STAPLE) ×3 IMPLANT
SUT NOVA NAB DX-16 0-1 5-0 T12 (SUTURE) ×7 IMPLANT
SUT VIC AB 4-0 SH 18 (SUTURE) ×3 IMPLANT
TACKER 5MM HERNIA 3.5CML NAB (ENDOMECHANICALS) IMPLANT
TOWEL OR 17X26 10 PK STRL BLUE (TOWEL DISPOSABLE) ×3 IMPLANT
TOWEL OR NON WOVEN STRL DISP B (DISPOSABLE) ×3 IMPLANT
TRAY LAPAROSCOPIC (CUSTOM PROCEDURE TRAY) ×3 IMPLANT
TROCAR BLADELESS OPT 5 100 (ENDOMECHANICALS) ×3 IMPLANT
TROCAR XCEL NON-BLD 11X100MML (ENDOMECHANICALS) IMPLANT

## 2019-07-11 NOTE — Op Note (Addendum)
Vanessa Collins  1946/08/13 11 July 2019    PCP:  Alroy Dust, L.Marlou Sa, MD   Surgeon: Kaylyn Lim, MD, FACS  Asst:  Armandina Gemma, MD, FACS  Anes:  general  Preop Dx: Enlarging ostomy site hernia Postop Dx: Incarcerated ostomy hernia containing omentum.    Procedure: Lap assisted repair of ostomy hernia with 11.4 cm ventralex hernia patch; primary closure of small umbilical hernia Location Surgery: [facility] 1 Complications: none  EBL:   minimal cc  Drains: none  Description of Procedure:  The patient was taken to OR 1 .  After anesthesia was administered and the patient was prepped  with Technicare and a timeout was performed. Access to the abdomen was achieved with a 5 mm Optiview through the left upper quadrant.  2 other 5 mm were placed on the right side because there was omentum herniated and incarcerated in this large ostomy hernia.  Applying manual force to the outside and with the harmonic scalpel we are able to dissect free the omentum which was stuck up in this hernia sac revealing a defect which was about 4 cm in diameter.  There was a notable defect in the umbilical region which we dealt with separately at the end.  Once this hernia sac was revealed and the omentum which had been incarcerated was reduced I made a transverse incision excising the old scar and then stripping out the sac down to reveal the fascia.  Fascial defect was delineated.  We measured this and felt that the 11.4 circular Ventralex patch would be good for this.  It was anchored with horizontal mattress sutures medial laterally inferiorly and superiorly through the fascia and through the mesh.  When this was in place we then closed the fascia separately with interrupted #1 Novafil's taking the purchase of fascia mesh and fascia.  When these were all tied it laid nicely but with a secure strap we tacked it down even further.  There was a little umbilical defect which I approximated with 0 Novafil through a separate incision  which was small. At the completion, we surveyed the abdomen and inspected the repair and the omentum that we had taken down and no bleeding was noted and the repair looked good.    A tap block with 30 cc of Exparel was performed on either side and the wounds were closed with 4-0 Vicryl pops and with Dermabond on the 3 laparoscopic incisions and with staples on the 2 hernia incisions after closing them with 4-0 Vicryl's.  The large wound actually tacked the subcutaneous tissue down to the fascia to close the dead space and then applied subcu sutures as well as staples.  A abdominal binder will be placed as well.  Patient tolerated procedure well was taken to recovery room satisfactory condition. The patient tolerated the procedure well and was taken to the PACU in stable condition.     Matt B. Hassell Done, [place], [facility], [place]

## 2019-07-11 NOTE — Transfer of Care (Signed)
Immediate Anesthesia Transfer of Care Note  Patient: Vanessa Collins  Procedure(s) Performed: LAPAROSCOPIC ASSISTED VENTRAL HERNIA REPAIR (N/A Abdomen)  Patient Location: PACU  Anesthesia Type:General  Level of Consciousness: drowsy, patient cooperative and responds to stimulation  Airway & Oxygen Therapy: Patient Spontanous Breathing and Patient connected to face mask oxygen  Post-op Assessment: Report given to RN and Post -op Vital signs reviewed and stable  Post vital signs: Reviewed and stable  Last Vitals:  Vitals Value Taken Time  BP 130/73 07/11/19 1210  Temp    Pulse 63 07/11/19 1214  Resp 14 07/11/19 1214  SpO2 100 % 07/11/19 1214  Vitals shown include unvalidated device data.  Last Pain:  Vitals:   07/11/19 0815  TempSrc: Oral  PainSc: 7       Patients Stated Pain Goal: 4 (33/29/51 8841)  Complications: No apparent anesthesia complications

## 2019-07-11 NOTE — Anesthesia Postprocedure Evaluation (Signed)
Anesthesia Post Note  Patient: Vanessa Collins  Procedure(s) Performed: LAPAROSCOPIC ASSISTED VENTRAL HERNIA REPAIR (N/A Abdomen)     Patient location during evaluation: PACU Anesthesia Type: General Level of consciousness: awake and alert, awake and oriented Pain management: pain level controlled Vital Signs Assessment: post-procedure vital signs reviewed and stable Respiratory status: spontaneous breathing, nonlabored ventilation, respiratory function stable and patient connected to nasal cannula oxygen Cardiovascular status: blood pressure returned to baseline and stable Postop Assessment: no apparent nausea or vomiting Anesthetic complications: no    Last Vitals:  Vitals:   07/11/19 1210 07/11/19 1215  BP: 130/73 132/65  Pulse: 67 66  Resp: 11 13  Temp: (!) 36.4 C   SpO2: 100% 100%    Last Pain:  Vitals:   07/11/19 1210  TempSrc:   PainSc: 7                  Catalina Gravel

## 2019-07-11 NOTE — Interval H&P Note (Signed)
History and Physical Interval Note:  07/11/2019 9:06 AM  Vanessa Collins  has presented today for surgery, with the diagnosis of OSTOMY SITE HERNIA.  The various methods of treatment have been discussed with the patient and family. After consideration of risks, benefits and other options for treatment, the patient has consented to  Procedure(s): [place] (N/A) as a surgical intervention.  The patient's history has been reviewed, patient examined, no change in status, stable for surgery.  I have reviewed the patient's chart and labs.  Questions were answered to the patient's satisfaction.     Pedro Earls

## 2019-07-11 NOTE — Anesthesia Procedure Notes (Signed)
Procedure Name: Intubation Date/Time: 07/11/2019 9:59 AM Performed by: Silas [place], CRNA Pre-anesthesia Checklist: Patient identified, Emergency Drugs available, Suction available and Patient being monitored Patient Re-evaluated:Patient Re-evaluated prior to induction Oxygen Delivery Method: Circle system utilized Preoxygenation: Pre-oxygenation with 100% oxygen Induction Type: IV induction and Rapid sequence Laryngoscope Size: Mac and 3 Grade View: Grade I Tube type: Oral Tube size: 7.0 mm Number of attempts: 1 Airway Equipment and Method: Stylet and Oral airway Placement Confirmation: ETT inserted through vocal cords under direct vision,  positive ETCO2 and breath sounds checked- equal and bilateral Secured at: 22 cm Tube secured with: Tape Dental Injury: Teeth and Oropharynx as per pre-operative assessment

## 2019-07-12 DIAGNOSIS — K430 Incisional hernia with obstruction, without gangrene: Secondary | ICD-10-CM | POA: Diagnosis not present

## 2019-07-12 MED ORDER — TRAMADOL HCL 50 MG PO TABS: 100.0000 mg | ORAL_TABLET | Freq: Four times a day (QID) | ORAL | 0 refills | Status: AC

## 2019-07-12 NOTE — Progress Notes (Signed)
Pt PIV removed without difficulty. Discharge instructions reviewed with pt and printed copy provided. Pt verbalizes understanding to all instructions given and denies any questions,  problems or concerns. Pt discharged home with family

## 2019-07-12 NOTE — Discharge Instructions (Signed)
HERNIA REPAIR: POST OP INSTRUCTIONS ° °1. DIET: Follow a light bland diet the first 24 hours after arrival home, such as soup, liquids, crackers, etc.  Be sure to include lots of fluids daily.  Avoid fast food or heavy meals as your are more likely to get nauseated.  Eat a low fat the next few days after surgery. °2. Take your usually prescribed home medications unless otherwise directed. °3. PAIN CONTROL: °a. Pain is best controlled by a usual combination of three different methods TOGETHER: °i. Ice/Heat °ii. Over the counter pain medication °iii. Prescription pain medication °b. Most patients will experience some swelling and bruising around the hernia(s) such as the bellybutton, groins, or old incisions.  Ice packs or heating pads (30-60 minutes up to 6 times a day) will help. Use ice for the first few days to help decrease swelling and bruising, then switch to heat to help relax tight/sore spots and speed recovery.  Some people prefer to use ice alone, heat alone, alternating between ice & heat.  Experiment to what works for you.  Swelling and bruising can take several weeks to resolve.   °c. It is helpful to take an over-the-counter pain medication regularly for the first few weeks.  Choose one of the following that works best for you: °i. Naproxen (Aleve, etc)  Two 220mg tabs twice a day °ii. Ibuprofen (Advil, etc) Three 200mg tabs four times a day (every meal & bedtime) °d. A  prescription for pain medication should be given to you upon discharge.  Take your pain medication as prescribed.  °i. If you are having problems/concerns with the prescription medicine (does not control pain, nausea, vomiting, rash, itching, etc), please call us [phone] to see if we need to switch you to a different pain medicine that will work better for you and/or control your side effect better. °ii. If you need a refill on your pain medication, please contact your pharmacy.  They will contact our office to request  authorization. Prescriptions will not be filled after 5 pm or on week-ends. °4. Avoid getting constipated.  Between the surgery and the pain medications, it is common to experience some constipation.  Increasing fluid intake and taking a fiber supplement (such as Metamucil, Citrucel, FiberCon, MiraLax, etc) 1-2 times a day regularly will usually help prevent this problem from occurring.  A mild laxative (prune juice, Milk of Magnesia, MiraLax, etc) should be taken according to package directions if there are no bowel movements after 48 hours.   °5. Wash / shower every day.  You may shower over the dressings as they are waterproof.   °6. Remove your waterproof bandages 5 days after surgery.  You may leave the incision open to air.  You may replace a dressing/Band-Aid to cover the incision for comfort if you wish.  Continue to shower over incision(s) after the dressing is off. ° ° ° °7. ACTIVITIES as tolerated:   °a. You may resume regular (light) daily activities beginning the next day--such as daily self-care, walking, climbing stairs--gradually increasing activities as tolerated.  If you can walk 30 minutes without difficulty, it is safe to try more intense activity such as jogging, treadmill, bicycling, low-impact aerobics, swimming, etc. °b. Save the most intensive and strenuous activity for last such as sit-ups, heavy lifting, contact sports, etc  Refrain from any heavy lifting or straining until you are off narcotics for pain control.   °c. DO NOT PUSH THROUGH PAIN.  Let pain be your guide: If   it hurts to do something, don't do it.  Pain is your body warning you to avoid that activity for another week until the pain goes down. °d. You may drive when you are no longer taking prescription pain medication, you can comfortably wear a seatbelt, and you can safely maneuver your car and apply brakes. °e. You may have sexual intercourse when it is comfortable.  °8. FOLLOW UP in our office °a. Please call [facility] at [phone] to set up an appointment to see your surgeon in the office for a follow-up appointment approximately 2-3 weeks after your surgery. °b. Make sure that you call for this appointment the day you arrive home to insure a convenient appointment time. °9.  IF YOU HAVE DISABILITY OR FAMILY LEAVE FORMS, BRING THEM TO THE OFFICE FOR PROCESSING.  DO NOT GIVE THEM TO YOUR DOCTOR. ° °WHEN TO CALL US [phone]: °1. Poor pain control °2. Reactions / problems with new medications (rash/itching, nausea, etc)  °3. Fever over 101.5 F (38.5 C) °4. Inability to urinate °5. Nausea and/or vomiting °6. Worsening swelling or bruising °7. Continued bleeding from incision. °8. Increased pain, redness, or drainage from the incision ° ° The clinic staff is available to answer your questions during regular business hours (8:30am-5pm).  Please don’t hesitate to call and ask to speak to one of our nurses for clinical concerns.  ° If you have a medical emergency, go to the nearest emergency room or call 911. ° A surgeon from [facility] is always on call at the hospitals in [place] ° °[facility] °[address], [address], [place], [place]  [address] ? ° [address] [address], [place], [place]   [address] °MAIN: [phone] ? TOLL FREE: [phone] ? FAX: [phone] °[email or web address] ° °

## 2019-07-12 NOTE — Discharge Summary (Signed)
Physician Discharge Summary  Patient ID: Vanessa Collins MRN: [id number] DOB/AGE: [birth year] 73 y.o.  Admit date: 07/11/2019 Discharge date: 07/12/2019  Admission Diagnoses: ventral hernia  Discharge Diagnoses:  Active Problems:   S/P repair of ventral hernia   Discharged Condition: good  Hospital Course: Patient was admitted overnight for observation.  The following morning her pain was controlled and she was ambulating without difficulty.  She was tolerating a diet.  She was felt to be in stable condition for discharge to home.  Consults: None  Significant Diagnostic Studies: labs: cbc, bmet  Treatments: IV hydration, analgesia: tramadol and surgery: VHR  Discharge Exam: Blood pressure 103/61, pulse 61, temperature 98.1 F (36.7 C), temperature source Oral, resp. rate 16, height 5\' 3"  (1.6 m), weight 75.5 kg, SpO2 98 %. General appearance: alert and cooperative GI: soft, appropriately tender Incision/Wound: clean,dry intact  Disposition: home   Allergies as of 07/12/2019      Reactions   Vicodin [hydrocodone-acetaminophen]    Upset stomach   Aspirin Other (See Comments)   Stomach cramping      Medication List    TAKE these medications   ALPRAZolam 0.5 MG tablet Commonly known as: XANAX Take 1 mg by mouth at bedtime.   aspirin 81 MG tablet Take 81 mg by mouth daily.   celecoxib 200 MG capsule Commonly known as: CELEBREX TAKE 1 CAPSULE BY MOUTH EVERY DAY WITH FOOD What changed: See the new instructions.   cetirizine 10 MG tablet Commonly known as: ZYRTEC Take 10 mg by mouth at bedtime.   estropipate 0.75 MG tablet Commonly known as: OGEN Take 0.75 mg by mouth daily.   famotidine 20 MG tablet Commonly known as: PEPCID Take 20 mg by mouth 2 (two) times daily.   folic acid 1 MG tablet Commonly known as: FOLVITE TAKE 2 TABLETS BY MOUTH EVERY DAY What changed:   how much to take  when to take this   hydroxychloroquine 200 MG tablet Commonly known as:  PLAQUENIL Take 1 tablet (200 mg total) by mouth daily.   levothyroxine 25 MCG tablet Commonly known as: SYNTHROID Take 25 mcg by mouth daily before breakfast.   methotrexate (PF) 50 MG/2ML injection INJECT 0.6 mls into SKIN ONCE WEEKLY. What changed:   how much to take  how to take this  when to take this  additional instructions   metoprolol succinate 25 MG 24 hr tablet Commonly known as: TOPROL-XL Take 25 mg by mouth every evening. Take with or immediately following a meal.   multivitamin with minerals Tabs tablet Take 1 tablet by mouth daily.   nitroGLYCERIN 0.4 MG SL tablet Commonly known as: NITROSTAT Place 0.4 mg under the tongue every 5 (five) minutes as needed for chest pain.   olmesartan-hydrochlorothiazide 40-25 MG tablet Commonly known as: BENICAR HCT Take 0.5 tablets by mouth daily.   predniSONE 5 MG tablet Commonly known as: DELTASONE Take 4 tabs po x 4 days, 3  tabs po x 4 days, 2  tabs po x 4 days, 1  tab po x 4 days What changed: Another medication with the same name was changed. Make sure you understand how and when to take each.   predniSONE 5 MG tablet Commonly known as: DELTASONE Take 4 tabs po x 4 days, 3  tabs po x 4 days, 2  tabs po x 4 days, 1  tab po x 4 days What changed: Another medication with the same name was changed. Make sure you understand how and  when to take each.   predniSONE 5 MG tablet Commonly known as: DELTASONE TAKE 1 TABLET BY MOUTH EVERY DAY AS DIRECTED What changed: when to take this   predniSONE 5 MG tablet Commonly known as: DELTASONE Take 15 mg po x 2 days, 10 mg po x 2 days then 5 mg daily What changed: Another medication with the same name was changed. Make sure you understand how and when to take each.   Premarin 0.3 MG tablet Generic drug: estrogens (conjugated) Take 0.3 mg by mouth daily.   sertraline 50 MG tablet Commonly known as: ZOLOFT Take 50 mg by mouth daily.   simvastatin 80 MG tablet Commonly  known as: ZOCOR Take 80 mg by mouth every evening.   traMADol 50 MG tablet Commonly known as: ULTRAM Take 2 tablets (100 mg total) by mouth 4 (four) times daily.   TUBERCULIN SYR 1CC/27GX1/2" 27G X 1/2" 1 ML Misc Commonly known as: B-D TB SYRINGE 1CC/27GX1/2" Patient to inject methotrexate weekly.   Vitamin D3 125 MCG (5000 UT) Caps Take 5,000 Units by mouth daily.      Follow-up Information    Johnathan Hausen, MD. Schedule an appointment as soon as possible for a visit in 2 week(s).   Specialty: General Surgery Contact information: [address] [place] [address] [phone]           Signed: Rosario Adie 3/55/9741, 8:13 AM

## 2019-07-14 ENCOUNTER — Other Ambulatory Visit: Payer: Self-pay | Admitting: Rheumatology

## 2019-07-14 ENCOUNTER — Encounter (HOSPITAL_COMMUNITY): Payer: Self-pay | Admitting: Surgery

## 2019-07-14 DIAGNOSIS — M0579 Rheumatoid arthritis with rheumatoid factor of multiple sites without organ or systems involvement: Secondary | ICD-10-CM

## 2019-07-24 ENCOUNTER — Ambulatory Visit (INDEPENDENT_AMBULATORY_CARE_PROVIDER_SITE_OTHER): Payer: BC Managed Care – PPO | Admitting: Rheumatology

## 2019-07-24 ENCOUNTER — Encounter: Payer: Self-pay | Admitting: Physician Assistant

## 2019-07-24 ENCOUNTER — Other Ambulatory Visit: Payer: Self-pay

## 2019-07-24 VITALS — BP 117/70 | HR 60 | Resp 12 | Ht 64.0 in | Wt 165.2 lb

## 2019-07-24 DIAGNOSIS — Z9884 Bariatric surgery status: Secondary | ICD-10-CM

## 2019-07-24 DIAGNOSIS — Z933 Colostomy status: Secondary | ICD-10-CM

## 2019-07-24 DIAGNOSIS — M5136 Other intervertebral disc degeneration, lumbar region: Secondary | ICD-10-CM

## 2019-07-24 DIAGNOSIS — M19041 Primary osteoarthritis, right hand: Secondary | ICD-10-CM | POA: Diagnosis not present

## 2019-07-24 DIAGNOSIS — M797 Fibromyalgia: Secondary | ICD-10-CM

## 2019-07-24 DIAGNOSIS — M51369 Other intervertebral disc degeneration, lumbar region without mention of lumbar back pain or lower extremity pain: Secondary | ICD-10-CM

## 2019-07-24 DIAGNOSIS — E559 Vitamin D deficiency, unspecified: Secondary | ICD-10-CM

## 2019-07-24 DIAGNOSIS — M5030 Other cervical disc degeneration, unspecified cervical region: Secondary | ICD-10-CM

## 2019-07-24 DIAGNOSIS — M0579 Rheumatoid arthritis with rheumatoid factor of multiple sites without organ or systems involvement: Secondary | ICD-10-CM

## 2019-07-24 DIAGNOSIS — Z8679 Personal history of other diseases of the circulatory system: Secondary | ICD-10-CM

## 2019-07-24 DIAGNOSIS — Z79899 Other long term (current) drug therapy: Secondary | ICD-10-CM

## 2019-07-24 DIAGNOSIS — Z8659 Personal history of other mental and behavioral disorders: Secondary | ICD-10-CM

## 2019-07-24 DIAGNOSIS — M19042 Primary osteoarthritis, left hand: Secondary | ICD-10-CM

## 2019-07-24 NOTE — Patient Instructions (Signed)
Standing Labs We placed an order today for your standing lab work.    Please come back and get your standing labs in October and every 3 months   We have open lab daily Monday through Thursday from 8:30-12:30 PM and 1:30-4:30 PM and Friday from 8:30-12:30 PM and 1:30 -4:00 PM at the office of Dr. Luqman Perrelli.   You may experience shorter wait times on Monday and Friday afternoons. The office is located at [address], [address], [place], [place] [address] No appointment is necessary.   Labs are drawn by [facility].  You may receive a bill from [facility] for your lab work.  If you wish to have your labs drawn at another location, please call the office 24 hours in advance to send orders.  If you have any questions regarding directions or hours of operation,  please call [phone].   Just as a reminder please drink plenty of water prior to coming for your lab work. Thanks!   

## 2019-07-25 LAB — BASIC METABOLIC PANEL WITH GFR
BUN/Creatinine Ratio: 18 (calc) (ref 6–22)
BUN: 19 mg/dL (ref 7–25)
CO2: 33 mmol/L — ABNORMAL HIGH (ref 20–32)
Calcium: 9.9 mg/dL (ref 8.6–10.4)
Chloride: 104 mmol/L (ref 98–110)
Creat: 1.04 mg/dL — ABNORMAL HIGH (ref 0.60–0.93)
GFR, Est African American: 63 mL/min/{1.73_m2} (ref >=60)
GFR, Est Non African American: 54 mL/min/{1.73_m2} — ABNORMAL LOW (ref >=60)
Glucose, Bld: 99 mg/dL (ref 65–99)
Potassium: 4.6 mmol/L (ref 3.5–5.3)
Sodium: 141 mmol/L (ref 135–146)

## 2019-07-25 NOTE — Progress Notes (Signed)
Mild improvement in GFR noted. She should avoid all NSAIDS.

## 2019-08-13 ENCOUNTER — Other Ambulatory Visit: Payer: Self-pay | Admitting: Rheumatology

## 2019-08-13 NOTE — Telephone Encounter (Signed)
ok 

## 2019-08-13 NOTE — Telephone Encounter (Signed)
Last Visit: 07/24/19 Next Visit: 11/26/19 Labs: 07/11/19 CBC- RBC 3.47 Hgb 11.9 MCV 109.5 MCH 34.5 Creat 1.04 GFR 63  Okay to refill Celebrex?

## 2019-08-19 ENCOUNTER — Telehealth: Payer: Self-pay | Admitting: *Deleted

## 2019-08-19 MED ORDER — PREDNISONE 5 MG PO TABS: ORAL_TABLET | ORAL | 0 refills | Status: DC

## 2019-08-19 NOTE — Telephone Encounter (Signed)
Patient states she is having trouble walking. Patient states she is currently on Prednisone 5 mg. Patient states she is having swelling in feet and ankles. Patient states she also has swelling in her hands. Patient states she fell on 08/17/19 on a walkway. Patient states she does not have any injuries. Patient denies hitting her head. Patient is requesting a prednisone taper. Patient is on MTX 0.6 mL weekly and PLQ 200 mg daily. Please advise.    Per Dr Estanislado Pandy okay to send in Prednisone taper. Patient advised.

## 2019-08-25 ENCOUNTER — Telehealth: Payer: Self-pay | Admitting: *Deleted

## 2019-08-25 NOTE — Progress Notes (Signed)
Office Visit Note  Patient: Vanessa Collins             Date of Birth: 1946-04-27           MRN: [id number]             PCP: Clovis Riley, L.August Saucer, MD Referring: Clovis Riley, Elbert Ewings.August Saucer, MD Visit Date: 08/26/2019 Occupation: @GUAROCC @  Subjective:  Pain in multiple joints.   History of Present Illness: Vanessa Collins is a 73 y.o. female with history of seropositive rheumatoid arthritis, osteoarthritis, degenerative disc disease and fibromyalgia.  She has not been doing well without 62 which was discontinued after she developed intestinal perforation.  She has been on methotrexate 0.6 mL /week along with folic acid.  She is taking Plaquenil 1 tablet a day.  And prednisone 10 mg p.o. daily.  She states she is unable to taper prednisone.  She becomes very stiff and is unable to function.  She has been having pain and discomfort in almost all of her joints.  She states her ankles and feet become so painful that she has difficulty walking.  She has tried all the Biologics in the past and had an adequate response with them.  Activities of Daily Living:  Patient reports morning stiffness for 4  hours.   Patient Reports nocturnal pain.  Difficulty dressing/grooming: Denies Difficulty climbing stairs: Reports Difficulty getting out of chair: Reports Difficulty using hands for taps, buttons, cutlery, and/or writing: Reports  Review of Systems  Constitutional: Positive for fatigue. Negative for night sweats, weight gain and weight loss.  HENT: Negative for mouth sores, trouble swallowing, trouble swallowing, mouth dryness and nose dryness.   Eyes: Negative for pain, redness, visual disturbance and dryness.  Respiratory: Negative for cough, shortness of breath and difficulty breathing.   Cardiovascular: Negative for chest pain, palpitations, hypertension, irregular heartbeat and swelling in legs/feet.  Gastrointestinal: Negative for blood in stool, constipation and diarrhea.  Endocrine: Negative for  increased urination.  Genitourinary: Negative for vaginal dryness.  Musculoskeletal: Positive for arthralgias, joint pain, joint swelling, myalgias, morning stiffness and myalgias. Negative for muscle weakness and muscle tenderness.  Skin: Negative for color change, rash, hair loss, skin tightness, ulcers and sensitivity to sunlight.  Allergic/Immunologic: Negative for susceptible to infections.  Neurological: Negative for dizziness, memory loss, night sweats and weakness.  Hematological: Negative for swollen glands.  Psychiatric/Behavioral: Positive for depressed mood. Negative for sleep disturbance. The patient is nervous/anxious.     PMFS History:  Patient Active Problem List   Diagnosis Date Noted  . S/P repair of ventral hernia 07/11/2019  . Hypertension   . Hyperlipidemia   . Diverticulitis of rectosigmoid s/p robotic LAR/colostomy takedown 10/09/2018 10/09/2018  . 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 History of depression 06/19/2017  . Rheumatoid arthritis involving multiple sites with positive rheumatoid factor (HCC) 01/25/2017  . High risk medication use 01/25/2017  . Immunosuppression (HCC) 01/25/2017  . Fibromyalgia 01/25/2017  . Primary osteoarthritis of both hands 01/25/2017  . Spondylosis of lumbar region without myelopathy or radiculopathy 01/25/2017  . DJD (degenerative joint disease), cervical 01/25/2017  . History of neutropenia 01/25/2017  . History of coronary artery disease 01/25/2017  . 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 Wears dentures     Past Medical History:  Diagnosis Date  . Arthritis    rheumatoid , osteo   . CAD (coronary artery disease)    PCI to LAD 2010  . Diverticulitis    with perforation  and colostomy placement    . Fibromyalgia   . Heart disease   . Heart murmur   . Hyperlipidemia   . Hypertension   . Myocardial infarction [facility]) 2008   one Stent  . Vitamin D deficiency   . Wears dentures     gum disease    Family History  Problem Relation Age of Onset  . Macular degeneration Mother   . Cancer Father   . Rheum arthritis Father   . Bipolar disorder Father   . Bipolar disorder Daughter   . Rheum arthritis Daughter    Past Surgical History:  Procedure Laterality Date  . CESAREAN SECTION    . COLECTOMY WITH COLOSTOMY CREATION/HARTMANN PROCEDURE  09/2017   Perforated diverticulitis  . CORONARY STENT PLACEMENT  08/2009  . EYE SURGERY    . LAPAROSCOPIC GASTRIC BANDING  04/05/2009   Dr Hassell Done  . PROCTOSCOPY N/A 10/09/2018   Procedure: RIDGID PROCTOSCOPY;  Surgeon: Michael Boston, MD;  Location: [facility];  Service: General;  Laterality: N/A;  . VAGINAL HYSTERECTOMY    . VENTRAL HERNIA REPAIR N/A 07/11/2019   Procedure: LAPAROSCOPIC ASSISTED VENTRAL HERNIA REPAIR;  Surgeon: Johnathan Hausen, MD;  Location: [facility];  Service: General;  Laterality: N/A;   Social History   Social History Narrative  . Not on file    There is no immunization history on file for this patient.   Objective: Vital Signs: BP 115/73 (BP Location: Left Arm, Patient Position: Sitting, Cuff Size: Normal)   Pulse 60   Resp 12   Ht 5\' 3"  (1.6 m)   Wt 165 lb 6.4 oz (75 kg)   BMI 29.30 kg/m    Physical Exam Vitals signs and nursing note reviewed.  Constitutional:      Appearance: She is well-developed.  HENT:     Head: Normocephalic and atraumatic.  Eyes:     Conjunctiva/sclera: Conjunctivae normal.  Neck:     Musculoskeletal: Normal range of motion.  Cardiovascular:     Rate and Rhythm: Normal rate and regular rhythm.     Heart sounds: Normal heart sounds.  Pulmonary:     Effort: Pulmonary effort is normal.     Breath sounds: Normal breath sounds.  Abdominal:     General: Bowel sounds are normal.     Palpations: Abdomen is soft.  Lymphadenopathy:     Cervical: No cervical adenopathy.  Skin:    General: Skin is warm and dry.     Capillary Refill: Capillary refill takes less than 2  seconds.  Neurological:     Mental Status: She is alert and oriented to person, place, and time.  Psychiatric:        Behavior: Behavior normal.      Musculoskeletal Exam: C-spine good range of motion.  Shoulder joints, elbow joints, wrist joints, MCPs, PIPs and DIPs with good range of motion with no synovitis.  She has CMC thickening and DIP thickening due to osteoarthritis.  Hip joints and knee joints with good range of motion.  She has some warmth on palpation of her ankle joints and tenderness on palpation. CDAI Exam: CDAI Score: 1.4  Patient Global: 9 mm; Provider Global: 5 mm Swollen: 2 ; Tender: 2  Joint Exam      Right  Left  Ankle  Swollen Tender  Swollen Tender     Investigation: No additional findings.  Imaging: No results found.  Recent Labs: Lab Results  Component Value Date   WBC 6.3 07/11/2019   HGB  11.9 (L) 07/11/2019   PLT 187 07/11/2019   NA 141 07/24/2019   K 4.6 07/24/2019   CL 104 07/24/2019   CO2 33 (H) 07/24/2019   GLUCOSE 99 07/24/2019   BUN 19 07/24/2019   CREATININE 1.04 (H) 07/24/2019   BILITOT 0.5 04/16/2019   ALKPHOS 33 06/20/2017   AST 25 04/16/2019   ALT 25 04/16/2019   PROT 6.4 04/16/2019   ALBUMIN 4.1 06/20/2017   CALCIUM 9.9 07/24/2019   GFRAA 63 07/24/2019    Speciality Comments: Prior therapy includes: Xeljanz (GI perforation), Humira (inadequate response), Enbrel (inadequate response), and Orencia (nausea and headaches)  Procedures:  No procedures performed Allergies: Vicodin [hydrocodone-acetaminophen] and Aspirin   Assessment / Plan:     Visit Diagnoses: Rheumatoid arthritis involving multiple sites with positive rheumatoid factor (HCC)-patient states that her rheumatoid arthritis is flaring and she has been having increased pain and swelling in multiple joints.  She has been taking prednisone 10 mg p.o. daily.  She states she is unable to function  or even walk without prednisone.  Methotrexate and Plaquenil combination  has not been effective.  The only medication which worked for her in the past was [place].  It was discontinued due to GI perforation.  Patient would like to stay on prednisone 10 mg p.o. daily.  We also discussed increasing Plaquenil to 1 mg p.o. twice daily Monday to Friday.  High risk medication use - MTX 0.6 mL subcu weekly, folic acid 1 mg p.o. daily, Plaquenil 1 tablet p.o. daily, prednisone 10 mg p.o. daily (off xeljanz due to colon rupture, has colostomy in place)  Current chronic use of systemic steroids-patient does understand the long-term side effects of steroids.  She state stated that her life is not worth living without the prednisone as it really impacts her quality of life.  After detailed discussion with her we came to a mutual agreement that she would continue to take prednisone 10 mg p.o. daily until she is ready to taper it.  Primary osteoarthritis of both hands-she has some CMC and DIP thickening.  DDD (degenerative disc disease), cervical-chronic discomfort  DDD (degenerative disc disease), lumbar-chronic discomfort  Fibromyalgia-she continues to have some generalized pain and discomfort from fibromyalgia.  She started going to pain management and is on tramadol now.  Vitamin D deficiency-she is on vitamin D supplement.  History of coronary artery disease  Lapband APS April 2010  History of hypertension  History of depression - situational  Status post colostomy [facility])  Orders: No orders of the defined types were placed in this encounter.  Meds ordered this encounter  Medications  . hydroxychloroquine (PLAQUENIL) 200 MG tablet    Sig: Take 200mg  by mouth twice daily, Monday-Friday only. None on Saturday or Sunday.    Dispense:  120 tablet    Refill:  0  . predniSONE (DELTASONE) 5 MG tablet    Sig: Take 2 tablets (10 mg total) by mouth daily with breakfast.    Dispense:  60 tablet    Refill:  0     Follow-Up Instructions: Return in about 3 months (around  11/25/2019) for Rheumatoid arthritis, Osteoarthritis, FMS.   Pollyann Savoy, MD  Note - This record has been created using Animal nutritionist.  Chart creation errors have been sought, but may not always  have been located. Such creation errors do not reflect on  the standard of medical care.

## 2019-08-25 NOTE — Telephone Encounter (Signed)
Patient contacted the office stating the last prednisone taper has not helped. Patient states she is still hurting and would like another prednisone taper. Patient has had several within the last few weeks. Patient has been scheduled for a follow up visit for evaluation on 08/26/19 at 3:00 pm.

## 2019-08-26 ENCOUNTER — Other Ambulatory Visit: Payer: Self-pay

## 2019-08-26 ENCOUNTER — Encounter: Payer: Self-pay | Admitting: Rheumatology

## 2019-08-26 ENCOUNTER — Ambulatory Visit (INDEPENDENT_AMBULATORY_CARE_PROVIDER_SITE_OTHER): Payer: BC Managed Care – PPO | Admitting: Rheumatology

## 2019-08-26 VITALS — BP 115/73 | HR 60 | Resp 12 | Ht 63.0 in | Wt 165.4 lb

## 2019-08-26 DIAGNOSIS — M19041 Primary osteoarthritis, right hand: Secondary | ICD-10-CM | POA: Diagnosis not present

## 2019-08-26 DIAGNOSIS — E559 Vitamin D deficiency, unspecified: Secondary | ICD-10-CM

## 2019-08-26 DIAGNOSIS — Z933 Colostomy status: Secondary | ICD-10-CM

## 2019-08-26 DIAGNOSIS — M5030 Other cervical disc degeneration, unspecified cervical region: Secondary | ICD-10-CM | POA: Diagnosis not present

## 2019-08-26 DIAGNOSIS — Z8659 Personal history of other mental and behavioral disorders: Secondary | ICD-10-CM

## 2019-08-26 DIAGNOSIS — M0579 Rheumatoid arthritis with rheumatoid factor of multiple sites without organ or systems involvement: Secondary | ICD-10-CM

## 2019-08-26 DIAGNOSIS — Z7952 Long term (current) use of systemic steroids: Secondary | ICD-10-CM

## 2019-08-26 DIAGNOSIS — M5136 Other intervertebral disc degeneration, lumbar region: Secondary | ICD-10-CM

## 2019-08-26 DIAGNOSIS — Z9884 Bariatric surgery status: Secondary | ICD-10-CM

## 2019-08-26 DIAGNOSIS — Z8679 Personal history of other diseases of the circulatory system: Secondary | ICD-10-CM

## 2019-08-26 DIAGNOSIS — Z79899 Other long term (current) drug therapy: Secondary | ICD-10-CM | POA: Diagnosis not present

## 2019-08-26 DIAGNOSIS — M19042 Primary osteoarthritis, left hand: Secondary | ICD-10-CM

## 2019-08-26 DIAGNOSIS — M797 Fibromyalgia: Secondary | ICD-10-CM

## 2019-08-26 MED ORDER — PREDNISONE 5 MG PO TABS: 10.0000 mg | ORAL_TABLET | Freq: Every day | ORAL | 0 refills | Status: DC

## 2019-08-26 MED ORDER — HYDROXYCHLOROQUINE SULFATE 200 MG PO TABS: ORAL_TABLET | ORAL | 0 refills | Status: DC

## 2019-08-26 NOTE — Patient Instructions (Signed)
Standing Labs We placed an order today for your standing lab work.    Please come back and get your standing labs in October and every 3 months   We have open lab daily Monday through Thursday from 8:30-12:30 PM and 1:30-4:30 PM and Friday from 8:30-12:30 PM and 1:30 -4:00 PM at the office of Dr. Alayzha An.   You may experience shorter wait times on Monday and Friday afternoons. The office is located at [address], [address], [place], [place] [address] No appointment is necessary.   Labs are drawn by [facility].  You may receive a bill from [facility] for your lab work.  If you wish to have your labs drawn at another location, please call the office 24 hours in advance to send orders.  If you have any questions regarding directions or hours of operation,  please call [phone].   Just as a reminder please drink plenty of water prior to coming for your lab work. Thanks!   

## 2019-09-09 ENCOUNTER — Other Ambulatory Visit: Payer: Self-pay | Admitting: Rheumatology

## 2019-09-09 DIAGNOSIS — M0579 Rheumatoid arthritis with rheumatoid factor of multiple sites without organ or systems involvement: Secondary | ICD-10-CM

## 2019-09-10 NOTE — Telephone Encounter (Signed)
Last Visit: 08/26/19 Next Visit: 11/26/19 Labs: 07/11/19 Hgb 11.9 RBC 3.47 MCV 109.5 MCH 34.3, Creat 1.04 GFR 54  Okay to refill MTX?

## 2019-09-10 NOTE — Telephone Encounter (Signed)
GFR is a stable.  Okay to refill methotrexate.

## 2019-09-18 ENCOUNTER — Other Ambulatory Visit: Payer: Self-pay | Admitting: Rheumatology

## 2019-09-18 DIAGNOSIS — M0579 Rheumatoid arthritis with rheumatoid factor of multiple sites without organ or systems involvement: Secondary | ICD-10-CM

## 2019-09-22 ENCOUNTER — Other Ambulatory Visit: Payer: Self-pay | Admitting: Rheumatology

## 2019-09-23 NOTE — Telephone Encounter (Signed)
Last Visit: 08/26/19 Next Visit: 11/26/19  Okay to refill per Dr. Deveshwar  

## 2019-09-30 ENCOUNTER — Telehealth: Payer: Self-pay | Admitting: Rheumatology

## 2019-09-30 MED ORDER — PREDNISONE 5 MG PO TABS: 10.0000 mg | ORAL_TABLET | Freq: Every day | ORAL | 0 refills | Status: DC

## 2019-09-30 NOTE — Addendum Note (Signed)
Addended by: Carole Binning on: 09/30/2019 01:52 PM   Modules accepted: Orders

## 2019-09-30 NOTE — Telephone Encounter (Addendum)
This is the first message received from patient. Patient states she is out of Prednisone. Patient requesting refill. Prescription sent to the pharmacy.    Last Visit: 08/26/19 Next Visit: 11/26/19  Okay to refill per Dr.Deveshwar

## 2019-09-30 NOTE — Telephone Encounter (Signed)
Patient left several messages requesting a call back from [place]. Patient states Dr. Estanislado Pandy was going to send in an rx for 2.5mg  Prednisone for patient, but it has not been sent in. Patient is completely out of medication, so this is very important for the patient. Please call to advise.

## 2019-10-08 ENCOUNTER — Other Ambulatory Visit: Payer: Self-pay | Admitting: *Deleted

## 2019-10-08 DIAGNOSIS — M0579 Rheumatoid arthritis with rheumatoid factor of multiple sites without organ or systems involvement: Secondary | ICD-10-CM

## 2019-10-08 MED ORDER — METHOTREXATE SODIUM CHEMO INJECTION (PF) 50 MG/2ML: INTRAMUSCULAR | 0 refills | Status: DC

## 2019-10-08 NOTE — Telephone Encounter (Signed)
Last Visit: 08/26/19 Next Visit: 11/26/19 Labs: 07/11/19 Hgb 11.9 RBC 3.47 MCV 109.5 MCH 34.3, Creat 1.04 GFR 54  Spoke with patient and reminded patient she is due to update labs this month.   Okay to refill per  Dr. Estanislado Pandy

## 2019-10-16 ENCOUNTER — Other Ambulatory Visit: Payer: Self-pay | Admitting: *Deleted

## 2019-10-16 ENCOUNTER — Other Ambulatory Visit: Payer: Self-pay

## 2019-10-16 DIAGNOSIS — Z79899 Other long term (current) drug therapy: Secondary | ICD-10-CM

## 2019-10-17 LAB — HEPATIC FUNCTION PANEL
AG Ratio: 1.9 (calc) (ref 1.0–2.5)
ALT: 20 U/L (ref 6–29)
AST: 24 U/L (ref 10–35)
Albumin: 4.1 g/dL (ref 3.6–5.1)
Alkaline phosphatase (APISO): 32 U/L — ABNORMAL LOW (ref 37–153)
Bilirubin, Direct: 0.1 mg/dL (ref 0.0–0.2)
Globulin: 2.2 g/dL (calc) (ref 1.9–3.7)
Indirect Bilirubin: 0.5 mg/dL (calc) (ref 0.2–1.2)
Total Bilirubin: 0.6 mg/dL (ref 0.2–1.2)
Total Protein: 6.3 g/dL (ref 6.1–8.1)

## 2019-10-17 LAB — BASIC METABOLIC PANEL
BUN/Creatinine Ratio: 21 (calc) (ref 6–22)
BUN: 20 mg/dL (ref 7–25)
CO2: 31 mmol/L (ref 20–32)
Calcium: 9.8 mg/dL (ref 8.6–10.4)
Chloride: 102 mmol/L (ref 98–110)
Creat: 0.96 mg/dL — ABNORMAL HIGH (ref 0.60–0.93)
Glucose, Bld: 104 mg/dL — ABNORMAL HIGH (ref 65–99)
Potassium: 4.5 mmol/L (ref 3.5–5.3)
Sodium: 140 mmol/L (ref 135–146)

## 2019-10-17 LAB — CBC WITH DIFFERENTIAL/PLATELET
Absolute Monocytes: 323 cells/uL (ref 200–950)
Basophils Absolute: 73 cells/uL (ref 0–200)
Basophils Relative: 1.2 %
Eosinophils Absolute: 43 cells/uL (ref 15–500)
Eosinophils Relative: 0.7 %
HCT: 38.4 % (ref 35.0–45.0)
Hemoglobin: 13.1 g/dL (ref 11.7–15.5)
Lymphs Abs: 665 cells/uL — ABNORMAL LOW (ref 850–3900)
MCH: 34.3 pg — ABNORMAL HIGH (ref 27.0–33.0)
MCHC: 34.1 g/dL (ref 32.0–36.0)
MCV: 100.5 fL — ABNORMAL HIGH (ref 80.0–100.0)
MPV: 11.6 fL (ref 7.5–12.5)
Monocytes Relative: 5.3 %
Neutro Abs: 4996 cells/uL (ref 1500–7800)
Neutrophils Relative %: 81.9 %
Platelets: 229 10*3/uL (ref 140–400)
RBC: 3.82 10*6/uL (ref 3.80–5.10)
RDW: 13.4 % (ref 11.0–15.0)
Total Lymphocyte: 10.9 %
WBC: 6.1 10*3/uL (ref 3.8–10.8)

## 2019-10-17 LAB — LIPID PANEL
Cholesterol: 161 mg/dL (ref <200)
HDL: 80 mg/dL (ref >=50)
LDL Cholesterol (Calc): 63 mg/dL (calc)
Non-HDL Cholesterol (Calc): 81 mg/dL (calc) (ref <130)
Total CHOL/HDL Ratio: 2 (calc) (ref <5.0)
Triglycerides: 94 mg/dL (ref <150)

## 2019-10-17 LAB — TSH: TSH: 1.03 mIU/L (ref 0.40–4.50)

## 2019-10-17 NOTE — Progress Notes (Signed)
Labs are stable.

## 2019-11-06 ENCOUNTER — Other Ambulatory Visit: Payer: Self-pay | Admitting: *Deleted

## 2019-11-06 MED ORDER — PREDNISONE 5 MG PO TABS: 10.0000 mg | ORAL_TABLET | Freq: Every day | ORAL | 0 refills | Status: DC

## 2019-11-06 NOTE — Telephone Encounter (Signed)
Refill request received via fax  Last Visit: 08/26/19 Next Visit: 11/26/19  Okay to refill per Dr. Estanislado Pandy

## 2019-11-13 ENCOUNTER — Other Ambulatory Visit: Payer: Self-pay | Admitting: Rheumatology

## 2019-11-13 NOTE — Progress Notes (Deleted)
Office Visit Note  Patient: Vanessa Collins             Date of Birth: September 09, 1946           MRN: [id number]             PCP: Clovis Riley, L.August Saucer, MD Referring: Clovis Riley, Elbert Ewings.August Saucer, MD Visit Date: 11/26/2019 Occupation: @GUAROCC @  Subjective:  No chief complaint on file.   History of Present Illness: Vanessa Collins is a 73 y.o. female ***   Activities of Daily Living:  Patient reports morning stiffness for *** {minute/hour:19697}.   Patient {ACTIONS;DENIES/REPORTS:21021675::"Denies"} nocturnal pain.  Difficulty dressing/grooming: {ACTIONS;DENIES/REPORTS:21021675::"Denies"} Difficulty climbing stairs: {ACTIONS;DENIES/REPORTS:21021675::"Denies"} Difficulty getting out of chair: {ACTIONS;DENIES/REPORTS:21021675::"Denies"} Difficulty using hands for taps, buttons, cutlery, and/or writing: {ACTIONS;DENIES/REPORTS:21021675::"Denies"}  No Rheumatology ROS completed.   PMFS History:  Patient Active Problem List   Diagnosis Date Noted  . S/P repair of ventral hernia 07/11/2019  . Hypertension   . Hyperlipidemia   . Diverticulitis of rectosigmoid s/p robotic LAR/colostomy takedown 10/09/2018 10/09/2018  . 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 History of depression 06/19/2017  . Rheumatoid arthritis involving multiple sites with positive rheumatoid factor (HCC) 01/25/2017  . High risk medication use 01/25/2017  . Immunosuppression (HCC) 01/25/2017  . Fibromyalgia 01/25/2017  . Primary osteoarthritis of both hands 01/25/2017  . Spondylosis of lumbar region without myelopathy or radiculopathy 01/25/2017  . DJD (degenerative joint disease), cervical 01/25/2017  . History of neutropenia 01/25/2017  . History of coronary artery disease 01/25/2017  . 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 Wears dentures     Past Medical History:  Diagnosis Date  . Arthritis    rheumatoid , osteo   . CAD (coronary artery disease)    PCI to LAD 2010  . Diverticulitis    with perforation and colostomy placement    . Fibromyalgia   . Heart disease   . Heart murmur   . Hyperlipidemia   . Hypertension   . Myocardial infarction [facility]) 2008   one Stent  . Vitamin D deficiency   . Wears dentures    gum disease    Family History  Problem Relation Age of Onset  . Macular degeneration Mother   . Cancer Father   . Rheum arthritis Father   . Bipolar disorder Father   . Bipolar disorder Daughter   . Rheum arthritis Daughter    Past Surgical History:  Procedure Laterality Date  . CESAREAN SECTION    . COLECTOMY WITH COLOSTOMY CREATION/HARTMANN PROCEDURE  09/2017   Perforated diverticulitis  . CORONARY STENT PLACEMENT  08/2009  . EYE SURGERY    . LAPAROSCOPIC GASTRIC BANDING  04/05/2009   Dr 06/05/2009  . PROCTOSCOPY N/A 10/09/2018   Procedure: RIDGID PROCTOSCOPY;  Surgeon: 10/11/2018, MD;  [facility]: [facility];  Service: General;  Laterality: N/A;  . VAGINAL HYSTERECTOMY    . VENTRAL HERNIA REPAIR N/A 07/11/2019   Procedure: LAPAROSCOPIC ASSISTED VENTRAL HERNIA REPAIR;  Surgeon: 07/13/2019, MD;  Location: [facility];  Service: General;  Laterality: N/A;   Social History   Social History Narrative  . Not on file    There is no immunization history on file for this patient.   Objective: Vital Signs: There were no vitals taken for this visit.   Physical Exam   Musculoskeletal Exam: ***  CDAI Exam: CDAI Score: - Patient Global: -; Provider Global: - Swollen: -; Tender: - Joint Exam   No joint exam has been documented  for this visit   There is currently no information documented on the homunculus. Go to the Rheumatology activity and complete the homunculus joint exam.  Investigation: No additional findings.  Imaging: No results found.  Recent Labs: Lab Results  Component Value Date   WBC 6.1 10/16/2019   HGB 13.1 10/16/2019   PLT 229 10/16/2019   NA 140 10/16/2019   K 4.5 10/16/2019   CL 102 10/16/2019   CO2 31 10/16/2019   GLUCOSE  104 (H) 10/16/2019   BUN 20 10/16/2019   CREATININE 0.96 (H) 10/16/2019   BILITOT 0.6 10/16/2019   ALKPHOS 33 06/20/2017   AST 24 10/16/2019   ALT 20 10/16/2019   PROT 6.3 10/16/2019   ALBUMIN 4.1 06/20/2017   CALCIUM 9.8 10/16/2019   GFRAA 63 07/24/2019    Speciality Comments: Prior therapy includes: Xeljanz (GI perforation), Humira (inadequate response), Enbrel (inadequate response), and Orencia (nausea and headaches)  Procedures:  No procedures performed Allergies: Vicodin [hydrocodone-acetaminophen] and Aspirin   Assessment / Plan:     Visit Diagnoses: No diagnosis found.  Orders: No orders of the defined types were placed in this encounter.  No orders of the defined types were placed in this encounter.   Face-to-face time spent with patient was *** minutes. Greater than 50% of time was spent in counseling and coordination of care.  Follow-Up Instructions: No follow-ups on file.   Earnestine Mealing, CMA  Note - This record has been created using Editor, commissioning.  Chart creation errors have been sought, but may not always  have been located. Such creation errors do not reflect on  the standard of medical care.

## 2019-11-13 NOTE — Telephone Encounter (Signed)
Last Visit: 08/26/19 Next Visit: 11/26/19  Okay to refill per Dr. Deveshwar  

## 2019-11-26 ENCOUNTER — Ambulatory Visit: Payer: BC Managed Care – PPO | Admitting: Rheumatology

## 2019-12-03 ENCOUNTER — Telehealth: Payer: Self-pay | Admitting: *Deleted

## 2019-12-03 NOTE — Telephone Encounter (Signed)
Patient stated she has to have her Celebrex, patient states her labs are normal, "I need it" , [phone], [facility].

## 2019-12-03 NOTE — Telephone Encounter (Signed)
Patient is on prednisone 50CH qd, MTX, folic acid and PLQ.    07/24/2019 office visit- "her creatinine has been elevated.  I believe it is due to the use of Celebrex in combination with methotrexate."   "She takes Celebrex and tramadol for pain relief.  Patient is a lot of discomfort.  I have advised her to discuss modifying her medications with pain management clinic."  Labs: 07/24/2019 Mild improvement in GFR noted. She should avoid all NSAIDS. Labs: 10/16/2019 creat 0.96, alk phos 32, MCV 100.5, MCH 34.3, lymphs abs 665.  Please advise patients request for celebrex.

## 2019-12-03 NOTE — Telephone Encounter (Signed)
I called patient and explained that she cannot take NSAIDs with prednisone as it will increase the risk of GI side effects.  She voiced understanding.  She also mentioned that her husband was recently hospitalized with COVID-19.  She did not contract the infection.

## 2019-12-06 ENCOUNTER — Other Ambulatory Visit: Payer: Self-pay | Admitting: Rheumatology

## 2019-12-08 NOTE — Telephone Encounter (Signed)
Last Visit: 08/26/2019  Next Visit: message sent to the front desk to schedule.   Okay to refill per Dr. Estanislado Pandy.

## 2019-12-10 ENCOUNTER — Other Ambulatory Visit: Payer: Self-pay | Admitting: Rheumatology

## 2019-12-10 DIAGNOSIS — M0579 Rheumatoid arthritis with rheumatoid factor of multiple sites without organ or systems involvement: Secondary | ICD-10-CM

## 2019-12-10 NOTE — Telephone Encounter (Signed)
Last Visit: 08/26/2019 Next Visit: message sent to the front desk to schedule.  Labs: 10/16/2019 Labs are stable  Okay to refill per Dr. Estanislado Pandy.

## 2019-12-22 ENCOUNTER — Other Ambulatory Visit: Payer: Self-pay | Admitting: Rheumatology

## 2019-12-22 DIAGNOSIS — M0579 Rheumatoid arthritis with rheumatoid factor of multiple sites without organ or systems involvement: Secondary | ICD-10-CM

## 2019-12-22 NOTE — Telephone Encounter (Addendum)
Last Visit: 08/26/2019  Next Visit: message sent to the front desk to schedule.  Labs: 10/16/2019 Labs are stable Eye exam: no baseline eye exam on file.   Advised patient we need PLQ eye exam, patient states she had the eye exam done at [facility] care at [facility].  I contacted [facility] and they will fax over recent PLQ eye exam.   Okay to refill 30 day supply, per Dr. Estanislado Pandy.

## 2019-12-29 ENCOUNTER — Telehealth: Payer: Self-pay | Admitting: Rheumatology

## 2019-12-29 NOTE — Telephone Encounter (Signed)
-----   Message from Vanessa Collins, CMA sent at 12/08/2019  8:52 AM EST ----- Patient is due for a follow up 11/2019. Thanks!

## 2019-12-29 NOTE — Telephone Encounter (Signed)
Patient to call back to reschedule follow up appointment when she can be seen in office.

## 2019-12-29 NOTE — Telephone Encounter (Signed)
-----   Message from Ellen Henri, CMA sent at 12/10/2019  9:03 AM EST ----- Patient due for a follow up 11/2019. Please call to schedule. Thanks!

## 2019-12-29 NOTE — Telephone Encounter (Signed)
Patient will call back to reschedule at a later date when she can be seen in office.

## 2019-12-30 ENCOUNTER — Telehealth: Payer: Self-pay | Admitting: Rheumatology

## 2019-12-30 NOTE — Telephone Encounter (Signed)
Patient called requesting a return call regarding her prescription of Folic Acid.  Patient states for years she has been taking 1 mg tablet 2 times/day and the last time her prescription was filled the directions changed to 1 mg tablet 1 time/day.  Patient states she is very concerned about the decrease in dosage and "thinks it might be a mistake."

## 2019-12-31 MED ORDER — FOLIC ACID 1 MG PO TABS: 2.0000 mg | ORAL_TABLET | Freq: Every day | ORAL | 2 refills | Status: DC

## 2019-12-31 NOTE — Telephone Encounter (Signed)
Attempted to contact patient and left message on machine to advise patient she can take 2mg  of folic acid daily. Patient is currently injecting MTX 0.62mL once weekly. Advised patient a new prescription would be sent to [facility].

## 2020-01-14 ENCOUNTER — Other Ambulatory Visit: Payer: Self-pay | Admitting: Rheumatology

## 2020-01-14 DIAGNOSIS — M0579 Rheumatoid arthritis with rheumatoid factor of multiple sites without organ or systems involvement: Secondary | ICD-10-CM

## 2020-01-15 NOTE — Telephone Encounter (Signed)
Last Visit: 08/26/19 Next Visit due December 2020. Message sent to front schedule patient.  Labs: 10/16/19 stable  Okay to refill per Dr. Corliss Skains

## 2020-01-15 NOTE — Telephone Encounter (Signed)
Please schedule patient for a follow up visit. Patient was due December 2020. Thanks! 

## 2020-01-23 ENCOUNTER — Other Ambulatory Visit: Payer: Self-pay | Admitting: Rheumatology

## 2020-01-23 DIAGNOSIS — M0579 Rheumatoid arthritis with rheumatoid factor of multiple sites without organ or systems involvement: Secondary | ICD-10-CM

## 2020-01-23 NOTE — Telephone Encounter (Addendum)
Last Visit: 08/26/19 Next Visit: 03/04/20 Labs: 10/16/19 stable PLQ Eye exam: no baseline eye exam on file  Left message to advise patient we have received the results from eye doctor and she has not completed the field of vision. Patient advised to call to schedule.  Okay to refill 30 day supply PLQ?

## 2020-01-23 NOTE — Telephone Encounter (Signed)
Ok to refill 30 day supply. She will need to update eye exam ASAP or we will not be able to continue to refill PLQ.

## 2020-01-28 ENCOUNTER — Telehealth: Payer: Self-pay | Admitting: Rheumatology

## 2020-01-28 NOTE — Telephone Encounter (Signed)
Patient states she has an appointment with Dr. Yetta Barre at [facility] tomorrow morning.  I have re-faxed plaquenil eye exam form to [phone] (number provided by [facility]).

## 2020-01-28 NOTE — Telephone Encounter (Signed)
Patient left a voicemail stating she spoke with Dr. Fatima Sanger nurse who told her she would fax the Plaquenil paperwork to her eye doctor and they haven't received it.  Patient states she has appointment tomorrow 01/29/20 at 10:00 am.   Please advise.

## 2020-01-28 NOTE — Telephone Encounter (Signed)
[facility] from [facility] called stating patient just had cataract surgery and Dr. Yetta Barre states she will have to wait for her field of vision test until her eyes heal.  [facility] states patient is worried that she won't be able to get her prescription refilled.  If you have any questions, please call back at #[phone]

## 2020-01-29 NOTE — Telephone Encounter (Signed)
Attempted to contact patient and left message on machine to advise patient to schedule an eye examination as soon as possible when she recovers from the cataract surgery. Advised patient that prescription was refill on 01/23/2020.

## 2020-01-29 NOTE — Telephone Encounter (Signed)
Okay to refill Plaquenil prescription.  Please advise patient to schedule an eye examination as soon as possible when she recovers from the cataract surgery.

## 2020-01-29 NOTE — Telephone Encounter (Signed)
Patient has been taking plaquenil since April 2020 and we do not have a baseline eye exam on file.   Please advise.

## 2020-02-03 NOTE — Progress Notes (Deleted)
Office Visit Note  Patient: Vanessa Collins             Date of Birth: [birth year]           MRN: [id number]             PCP: Alroy Dust, L.Marlou Sa, MD Referring: Alroy Dust, Carlean Jews.Marlou Sa, MD Visit Date: 02/05/2020 Occupation: @GUAROCC @  Subjective:  No chief complaint on file.   History of Present Illness: Vanessa Collins is a 74 y.o. female ***   Activities of Daily Living:  Patient reports morning stiffness for *** {minute/hour:19697}.   Patient {ACTIONS;DENIES/REPORTS:21021675::"Denies"} nocturnal pain.  Difficulty dressing/grooming: {ACTIONS;DENIES/REPORTS:21021675::"Denies"} Difficulty climbing stairs: {ACTIONS;DENIES/REPORTS:21021675::"Denies"} Difficulty getting out of chair: {ACTIONS;DENIES/REPORTS:21021675::"Denies"} Difficulty using hands for taps, buttons, cutlery, and/or writing: {ACTIONS;DENIES/REPORTS:21021675::"Denies"}  No Rheumatology ROS completed.   PMFS History:  Patient Active Problem List   Diagnosis Date Noted  . S/P repair of ventral hernia 07/11/2019  . Hypertension   . Hyperlipidemia   . Diverticulitis of rectosigmoid s/p robotic LAR/colostomy takedown 10/09/2018 10/09/2018  . 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 History of depression 06/19/2017  . Rheumatoid arthritis involving multiple sites with positive rheumatoid factor ([place]) 01/25/2017  . High risk medication use 01/25/2017  . Immunosuppression ([place]) 01/25/2017  . Fibromyalgia 01/25/2017  . Primary osteoarthritis of both hands 01/25/2017  . Spondylosis of lumbar region without myelopathy or radiculopathy 01/25/2017  . DJD (degenerative joint disease), cervical 01/25/2017  . History of neutropenia 01/25/2017  . History of coronary artery disease 01/25/2017  . 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 Wears dentures     Past Medical History:  Diagnosis Date  . Arthritis    rheumatoid , osteo   . CAD (coronary artery disease)    PCI to LAD 2010  . Diverticulitis    with perforation and colostomy placement    . Fibromyalgia   . Heart disease   . Heart murmur   . Hyperlipidemia   . Hypertension   . Myocardial infarction [facility]) 2008   one Stent  . Vitamin D deficiency   . Wears dentures    gum disease    Family History  Problem Relation Age of Onset  . Macular degeneration Mother   . Cancer Father   . Rheum arthritis Father   . Bipolar disorder Father   . Bipolar disorder Daughter   . Rheum arthritis Daughter    Past Surgical History:  Procedure Laterality Date  . CESAREAN SECTION    . COLECTOMY WITH COLOSTOMY CREATION/HARTMANN PROCEDURE  09/2017   Perforated diverticulitis  . CORONARY STENT PLACEMENT  08/2009  . EYE SURGERY    . LAPAROSCOPIC GASTRIC BANDING  04/05/2009   Dr Hassell Done  . PROCTOSCOPY N/A 10/09/2018   Procedure: RIDGID PROCTOSCOPY;  Surgeon: Michael Boston, MD;  Location[facility];  Service: General;  Laterality: N/A;  . VAGINAL HYSTERECTOMY    . VENTRAL HERNIA REPAIR N/A 07/11/2019   Procedure: LAPAROSCOPIC ASSISTED VENTRAL HERNIA REPAIR;  Surgeon: Johnathan Hausen, MD;  Location: [facility];  Service: General;  Laterality: N/A;   Social History   Social History Narrative  . Not on file    There is no immunization history on file for this patient.   Objective: Vital Signs: There were no vitals taken for this visit.   Physical Exam   Musculoskeletal Exam: ***  CDAI Exam: CDAI Score: -- Patient Global: --; Provider Global: -- Swollen: --; Tender: -- Joint Exam 02/05/2020   No joint exam has been  documented for this visit   There is currently no information documented on the homunculus. Go to the Rheumatology activity and complete the homunculus joint exam.  Investigation: No additional findings.  Imaging: No results found.  Recent Labs: Lab Results  Component Value Date   WBC 6.1 10/16/2019   HGB 13.1 10/16/2019   PLT 229 10/16/2019   NA 140 10/16/2019   K 4.5 10/16/2019   CL 102 10/16/2019   CO2 31  10/16/2019   GLUCOSE 104 (H) 10/16/2019   BUN 20 10/16/2019   CREATININE 0.96 (H) 10/16/2019   BILITOT 0.6 10/16/2019   ALKPHOS 33 06/20/2017   AST 24 10/16/2019   ALT 20 10/16/2019   PROT 6.3 10/16/2019   ALBUMIN 4.1 06/20/2017   CALCIUM 9.8 10/16/2019   GFRAA 63 07/24/2019    Speciality Comments: Prior therapy includes: Xeljanz (GI perforation), Humira (inadequate response), Enbrel (inadequate response), and Orencia (nausea and headaches)  Procedures:  No procedures performed Allergies: Vicodin [hydrocodone-acetaminophen] and Aspirin   Assessment / Plan:     Visit Diagnoses: No diagnosis found.  Orders: No orders of the defined types were placed in this encounter.  No orders of the defined types were placed in this encounter.   Face-to-face time spent with patient was *** minutes. Greater than 50% of time was spent in counseling and coordination of care.  Follow-Up Instructions: No follow-ups on file.   Gearldine Bienenstock, PA-C  Note - This record has been created using Dragon software.  Chart creation errors have been sought, but may not always  have been located. Such creation errors do not reflect on  the standard of medical care.

## 2020-02-05 ENCOUNTER — Ambulatory Visit: Payer: BC Managed Care – PPO | Admitting: Rheumatology

## 2020-02-11 ENCOUNTER — Other Ambulatory Visit: Payer: Self-pay | Admitting: Rheumatology

## 2020-02-11 DIAGNOSIS — M0579 Rheumatoid arthritis with rheumatoid factor of multiple sites without organ or systems involvement: Secondary | ICD-10-CM

## 2020-02-13 NOTE — Telephone Encounter (Signed)
yes

## 2020-02-13 NOTE — Telephone Encounter (Addendum)
Last Visit: 08/26/19 Next Visit: 03/04/20 Labs: 10/16/19 stable PLQ Eye exam:no baseline eye exam on file  Faxed PLQ Eye Exam form to eye doctor to be filled out and faxed back  Okay to refill PLQ and Prednisone?

## 2020-02-13 NOTE — Progress Notes (Deleted)
Office Visit Note  Patient: Vanessa Collins             Date of Birth: 08-18-1946           MRN: [id number]             PCP: Alroy Dust, L.Marlou Sa, MD Referring: Alroy Dust, Carlean Jews.Marlou Sa, MD Visit Date: 02/17/2020 Occupation: @GUAROCC @  Subjective:  No chief complaint on file.   History of Present Illness: CELE MOTE is a 74 y.o. female ***   Activities of Daily Living:  Patient reports morning stiffness for *** {minute/hour:19697}.   Patient {ACTIONS;DENIES/REPORTS:21021675::"Denies"} nocturnal pain.  Difficulty dressing/grooming: {ACTIONS;DENIES/REPORTS:21021675::"Denies"} Difficulty climbing stairs: {ACTIONS;DENIES/REPORTS:21021675::"Denies"} Difficulty getting out of chair: {ACTIONS;DENIES/REPORTS:21021675::"Denies"} Difficulty using hands for taps, buttons, cutlery, and/or writing: {ACTIONS;DENIES/REPORTS:21021675::"Denies"}  No Rheumatology ROS completed.   PMFS History:  Patient Active Problem List   Diagnosis Date Noted  . S/P repair of ventral hernia 07/11/2019  . Hypertension   . Hyperlipidemia   . Diverticulitis of rectosigmoid s/p robotic LAR/colostomy takedown 10/09/2018 10/09/2018  . 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 History of depression 06/19/2017  . Rheumatoid arthritis involving multiple sites with positive rheumatoid factor ([place]) 01/25/2017  . High risk medication use 01/25/2017  . Immunosuppression ([place]) 01/25/2017  . Fibromyalgia 01/25/2017  . Primary osteoarthritis of both hands 01/25/2017  . Spondylosis of lumbar region without myelopathy or radiculopathy 01/25/2017  . DJD (degenerative joint disease), cervical 01/25/2017  . History of neutropenia 01/25/2017  . History of coronary artery disease 01/25/2017  . 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 Wears dentures     Past Medical History:  Diagnosis Date  . Arthritis    rheumatoid , osteo   . CAD (coronary artery disease)    PCI to LAD 2010  . Diverticulitis    with perforation and colostomy placement    . Fibromyalgia   . Heart disease   . Heart murmur   . Hyperlipidemia   . Hypertension   . Myocardial infarction [facility]) 2008   one Stent  . Vitamin D deficiency   . Wears dentures    gum disease    Family History  Problem Relation Age of Onset  . Macular degeneration Mother   . Cancer Father   . Rheum arthritis Father   . Bipolar disorder Father   . Bipolar disorder Daughter   . Rheum arthritis Daughter    Past Surgical History:  Procedure Laterality Date  . CESAREAN SECTION    . COLECTOMY WITH COLOSTOMY CREATION/HARTMANN PROCEDURE  09/2017   Perforated diverticulitis  . CORONARY STENT PLACEMENT  08/2009  . EYE SURGERY    . LAPAROSCOPIC GASTRIC BANDING  04/05/2009   Dr Hassell Done  . PROCTOSCOPY N/A 10/09/2018   Procedure: RIDGID PROCTOSCOPY;  Surgeon: Michael Boston, MD;  Location: [facility];  Service: General;  Laterality: N/A;  . VAGINAL HYSTERECTOMY    . VENTRAL HERNIA REPAIR N/A 07/11/2019   Procedure: LAPAROSCOPIC ASSISTED VENTRAL HERNIA REPAIR;  Surgeon: Johnathan Hausen, MD;  Location: [facility];  Service: General;  Laterality: N/A;   Social History   Social History Narrative  . Not on file    There is no immunization history on file for this patient.   Objective: Vital Signs: There were no vitals taken for this visit.   Physical Exam   Musculoskeletal Exam: ***  CDAI Exam: CDAI Score: -- Patient Global: --; Provider Global: -- Swollen: --; Tender: -- Joint Exam 02/17/2020   No joint exam has been  documented for this visit   There is currently no information documented on the homunculus. Go to the Rheumatology activity and complete the homunculus joint exam.  Investigation: No additional findings.  Imaging: No results found.  Recent Labs: Lab Results  Component Value Date   WBC 6.1 10/16/2019   HGB 13.1 10/16/2019   PLT 229 10/16/2019   NA 140 10/16/2019   K 4.5 10/16/2019   CL 102 10/16/2019   CO2 31  10/16/2019   GLUCOSE 104 (H) 10/16/2019   BUN 20 10/16/2019   CREATININE 0.96 (H) 10/16/2019   BILITOT 0.6 10/16/2019   ALKPHOS 33 06/20/2017   AST 24 10/16/2019   ALT 20 10/16/2019   PROT 6.3 10/16/2019   ALBUMIN 4.1 06/20/2017   CALCIUM 9.8 10/16/2019   GFRAA 63 07/24/2019    Speciality Comments: Prior therapy includes: Xeljanz (GI perforation), Humira (inadequate response), Enbrel (inadequate response), and Orencia (nausea and headaches)  Procedures:  No procedures performed Allergies: Vicodin [hydrocodone-acetaminophen] and Aspirin   Assessment / Plan:     Visit Diagnoses: No diagnosis found.  Orders: No orders of the defined types were placed in this encounter.  No orders of the defined types were placed in this encounter.   Face-to-face time spent with patient was *** minutes. Greater than 50% of time was spent in counseling and coordination of care.  Follow-Up Instructions: No follow-ups on file.   Gearldine Bienenstock, PA-C  Note - This record has been created using Dragon software.  Chart creation errors have been sought, but may not always  have been located. Such creation errors do not reflect on  the standard of medical care.

## 2020-02-17 ENCOUNTER — Ambulatory Visit: Payer: BC Managed Care – PPO | Admitting: Rheumatology

## 2020-02-22 ENCOUNTER — Other Ambulatory Visit: Payer: Self-pay

## 2020-02-22 ENCOUNTER — Ambulatory Visit: Payer: BC Managed Care – PPO | Attending: Internal Medicine

## 2020-02-22 DIAGNOSIS — Z23 Encounter for immunization: Secondary | ICD-10-CM

## 2020-02-22 NOTE — Progress Notes (Signed)
   Covid-19 Vaccination Clinic  Name:  MANUELLA BLACKSON    MRN: [id number] DOB: 1946/10/22  02/22/2020  Ms. Brandy was observed post Covid-19 immunization for 15 minutes without incidence. She was provided with Vaccine Information Sheet and instruction to access the V-Safe system.   Ms. Meeker was instructed to call 911 with any severe reactions post vaccine: Marland Kitchen Difficulty breathing  . Swelling of your face and throat  . A fast heartbeat  . A bad rash all over your body  . Dizziness and weakness    Immunizations Administered    Name Date Dose VIS Date Route   Pfizer COVID-19 Vaccine 02/22/2020  3:34 PM 0.3 mL 12/05/2019 Intramuscular   Manufacturer: [employer], [employer]   Lot: [id number]   NDC: [address]-8

## 2020-03-01 ENCOUNTER — Other Ambulatory Visit: Payer: Self-pay | Admitting: Physician Assistant

## 2020-03-01 DIAGNOSIS — Z1231 Encounter for screening mammogram for malignant neoplasm of breast: Secondary | ICD-10-CM

## 2020-03-04 ENCOUNTER — Ambulatory Visit: Payer: BC Managed Care – PPO | Admitting: Rheumatology

## 2020-03-10 ENCOUNTER — Other Ambulatory Visit: Payer: Self-pay | Admitting: Rheumatology

## 2020-03-10 DIAGNOSIS — M0579 Rheumatoid arthritis with rheumatoid factor of multiple sites without organ or systems involvement: Secondary | ICD-10-CM

## 2020-03-11 NOTE — Telephone Encounter (Signed)
Last Visit: 08/26/19 Next Visit: 04/07/20 Labs:10/16/19 stable PLQEye exam:no baseline eye exam on file  Faxed PLQ Eye Exam form to eye doctor to be filled out and faxed back. Also left message for patient to reach out to the doctor.   Okay to refill PLQ and Prednisone?

## 2020-03-11 NOTE — Telephone Encounter (Signed)
Please document the date patient states she had her eye examination and if it was normal.  Then it is ok to refill meds.

## 2020-03-12 NOTE — Telephone Encounter (Signed)
Attempted to contact the patient and left message for patient to call the office.  

## 2020-03-15 ENCOUNTER — Other Ambulatory Visit: Payer: Self-pay | Admitting: Rheumatology

## 2020-03-15 DIAGNOSIS — M0579 Rheumatoid arthritis with rheumatoid factor of multiple sites without organ or systems involvement: Secondary | ICD-10-CM

## 2020-03-15 MED ORDER — PREDNISONE 5 MG PO TABS: ORAL_TABLET | ORAL | 0 refills | Status: DC

## 2020-03-15 NOTE — Telephone Encounter (Signed)
Patient called stating she is scheduled for her Plaquenil eye exam tomorrow 03/16/20 with Dr. Yetta Barre.  Patient states she was not able to have the exam earlier due to having cateract surgery on both eyes.  Patient also scheduled an appointment with Ladona Ridgel for Wednesday, 03/17/20.

## 2020-03-15 NOTE — Telephone Encounter (Signed)
Last Visit: 08/26/19 Next Visit: 03/17/20 Labs:10/16/19 stable PLQEye exam:no baseline eye exam on file  Patient has her PLQ eye exam scheduled for 03/17/20  Okay to refill 30 day supply PLQ?

## 2020-03-15 NOTE — Telephone Encounter (Signed)
Attempted to contact the patient several rimes with no response. Prescription for prednisone sent to the pharmacy. Declined PLQ until patient returns call with PLQ eye exam information.

## 2020-03-16 MED ORDER — HYDROXYCHLOROQUINE SULFATE 200 MG PO TABS: ORAL_TABLET | ORAL | 0 refills | Status: DC

## 2020-03-16 NOTE — Telephone Encounter (Signed)
Ok to refill 30-day supply

## 2020-03-16 NOTE — Progress Notes (Signed)
Office Visit Note  Patient: Vanessa Collins             Date of Birth: November 23, 1946           MRN: [id number]             PCP: Clovis Riley, L.August Saucer, MD Referring: Clovis Riley, Elbert Ewings.August Saucer, MD Visit Date: 03/17/2020 Occupation: @GUAROCC @  Subjective:  Other (increased pain )   History of Present Illness: Vanessa Collins is a 74 y.o. female with history of rheumatoid arthritis, osteoarthritis and degenerative disc disease.  She states she has been experiencing increased pain all over.  She states the pain from her lower back radiates into her right lower extremity which causes lot of discomfort.  She also has degenerative disc disease of cervical spine which causes discomfort.  She has been having stiffness and pain in her bilateral hands.  She has not seen much joint swelling.  Activities of Daily Living:  Patient reports morning stiffness for 4-5 hours.   Patient Reports nocturnal pain.  Difficulty dressing/grooming: Reports Difficulty climbing stairs: Reports Difficulty getting out of chair: Reports Difficulty using hands for taps, buttons, cutlery, and/or writing: Reports  Review of Systems  Constitutional: Positive for fatigue. Negative for night sweats, weight gain and weight loss.  HENT: Negative for mouth sores, trouble swallowing, trouble swallowing, mouth dryness and nose dryness.   Eyes: Negative for pain, redness, itching, visual disturbance and dryness.  Respiratory: Negative for cough, shortness of breath, wheezing and difficulty breathing.   Cardiovascular: Negative for chest pain, palpitations, hypertension, irregular heartbeat and swelling in legs/feet.  Gastrointestinal: Positive for diarrhea. Negative for blood in stool and constipation.  Endocrine: Negative for increased urination.  Genitourinary: Negative for difficulty urinating, painful urination and vaginal dryness.  Musculoskeletal: Positive for arthralgias, joint pain, joint swelling, myalgias, morning stiffness, muscle  tenderness and myalgias. Negative for muscle weakness.  Skin: Negative for color change, rash, hair loss, redness, skin tightness, ulcers and sensitivity to sunlight.  Allergic/Immunologic: Negative for susceptible to infections.  Neurological: Positive for dizziness, numbness and weakness. Negative for headaches, memory loss and night sweats.  Hematological: Negative for swollen glands.  Psychiatric/Behavioral: Positive for depressed mood. Negative for suicidal ideas, confusion and sleep disturbance. The patient is not nervous/anxious.     PMFS History:  Patient Active Problem List   Diagnosis Date Noted  . S/P repair of ventral hernia 07/11/2019  . Hypertension   . Hyperlipidemia   . Diverticulitis of rectosigmoid s/p robotic LAR/colostomy takedown 10/09/2018 10/09/2018  . 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 History of depression 06/19/2017  . Rheumatoid arthritis involving multiple sites with positive rheumatoid factor (HCC) 01/25/2017  . High risk medication use 01/25/2017  . Immunosuppression (HCC) 01/25/2017  . Fibromyalgia 01/25/2017  . Primary osteoarthritis of both hands 01/25/2017  . Spondylosis of lumbar region without myelopathy or radiculopathy 01/25/2017  . DJD (degenerative joint disease), cervical 01/25/2017  . History of neutropenia 01/25/2017  . History of coronary artery disease 01/25/2017  . 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 Wears dentures     Past Medical History:  Diagnosis Date  . Arthritis    rheumatoid , osteo   . CAD (coronary artery disease)    PCI to LAD 2010  . Diverticulitis    with perforation and colostomy placement    . Fibromyalgia   . Heart disease   . Heart murmur   . Hyperlipidemia   . Hypertension   . Myocardial infarction (  HCC) 2008   one Stent  . Vitamin D deficiency   . Wears dentures    gum disease    Family History  Problem Relation Age of Onset  . Macular degeneration Mother     . Cancer Father   . Rheum arthritis Father   . Bipolar disorder Father   . Bipolar disorder Daughter   . Rheum arthritis Daughter    Past Surgical History:  Procedure Laterality Date  . CESAREAN SECTION    . COLECTOMY WITH COLOSTOMY CREATION/HARTMANN PROCEDURE  09/2017   Perforated diverticulitis  . CORONARY STENT PLACEMENT  08/2009  . EYE SURGERY    . LAPAROSCOPIC GASTRIC BANDING  04/05/2009   Dr Daphine Deutscher  . PROCTOSCOPY N/A 10/09/2018   Procedure: RIDGID PROCTOSCOPY;  Surgeon: Karie Soda, MD;  Location: [facility];  Service: General;  Laterality: N/A;  . VAGINAL HYSTERECTOMY    . VENTRAL HERNIA REPAIR N/A 07/11/2019   Procedure: LAPAROSCOPIC ASSISTED VENTRAL HERNIA REPAIR;  Surgeon: Luretha Murphy, MD;  Location: [facility];  Service: General;  Laterality: N/A;   Social History   Social History Narrative  . Not on file   Immunization History  Administered Date(s) Administered  . PFIZER SARS-COV-2 Vaccination 02/22/2020     Objective: Vital Signs: BP 108/68 (BP Location: Left Arm, Patient Position: Sitting, Cuff Size: Normal)   Pulse 66   Resp 13   Ht 5\' 5"  (1.651 m)   Wt 160 lb (72.6 kg) Comment: per patient, patient refused to weigh  BMI 26.63 kg/m    Physical Exam Vitals and nursing note reviewed.  Constitutional:      Appearance: She is well-developed.  HENT:     Head: Normocephalic and atraumatic.  Eyes:     Conjunctiva/sclera: Conjunctivae normal.  Cardiovascular:     Rate and Rhythm: Normal rate and regular rhythm.     Heart sounds: Normal heart sounds.  Pulmonary:     Effort: Pulmonary effort is normal.     Breath sounds: Normal breath sounds.  Abdominal:     General: Bowel sounds are normal.     Palpations: Abdomen is soft.  Musculoskeletal:     Cervical back: Normal range of motion.  Lymphadenopathy:     Cervical: No cervical adenopathy.  Skin:    General: Skin is warm and dry.     Capillary Refill: Capillary refill takes less than 2 seconds.   Neurological:     Mental Status: She is alert and oriented to person, place, and time.  Psychiatric:        Behavior: Behavior normal.      Musculoskeletal Exam: She has limited range of motion of cervical and lumbar spine.  Shoulder joints elbow joints wrist joints are in good range of motion.  She has bilateral CMC prominence.  She has PIP and DIP thickening.  No MCP synovitis was noted.  Hip joints and knee joints with good range of motion.  She has some fullness in her ankle joints but no tenderness was noted.  CDAI Exam: CDAI Score: -- Patient Global: --; Provider Global: -- Swollen: 0 ; Tender: 1  Joint Exam 03/17/2020      Right  Left  Lumbar Spine   Tender        Investigation: No additional findings.  Imaging: No results found.  Recent Labs: Lab Results  Component Value Date   WBC 6.1 10/16/2019   HGB 13.1 10/16/2019   PLT 229 10/16/2019   NA 140 10/16/2019   K 4.5  10/16/2019   CL 102 10/16/2019   CO2 31 10/16/2019   GLUCOSE 104 (H) 10/16/2019   BUN 20 10/16/2019   CREATININE 0.96 (H) 10/16/2019   BILITOT 0.6 10/16/2019   ALKPHOS 33 06/20/2017   AST 24 10/16/2019   ALT 20 10/16/2019   PROT 6.3 10/16/2019   ALBUMIN 4.1 06/20/2017   CALCIUM 9.8 10/16/2019   GFRAA 63 07/24/2019    Speciality Comments: Prior therapy includes: Xeljanz (GI perforation), Humira (inadequate response), Enbrel (inadequate response), and Orencia (nausea and headaches)  Procedures:  No procedures performed Allergies: Vicodin [hydrocodone-acetaminophen] and Aspirin   Assessment / Plan:     Visit Diagnoses: Rheumatoid arthritis involving multiple sites with positive rheumatoid factor (HCC)-patient increased pain and discomfort in multiple joints.  No synovitis was noted on the examination today.  She states she had increased prednisone recently.  Have discouraged the use of prednisone at the higher dosage.  High risk medication use - MTX 0.6 mL subcu weekly, folic acid 1 mg p.o.  daily, Plaquenil BID M-F, prednisone 10 mg p.o. daily (off xeljanz due to colon rupture, has colostomy - Plan: CBC with Differential/Platelet, COMPLETE METABOLIC PANEL WITH GFR today and then every 3 months.  Current chronic use of systemic steroids-patient states that she is unable to taper steroids.  Primary osteoarthritis of both hands-she has severe osteoarthritis in her hands.  She has been having discomfort CMC PIP and DIP joints.  DDD (degenerative disc disease), cervical-she has ongoing discomfort in her cervical region.  DDD (degenerative disc disease), lumbar-she has chronic lower back pain which is causing most of her discomfort.  She states she has been having right-sided radiculopathy.  She has tried some gabapentin from her sister and muscle relaxers which were helpful.  Have advised her to discuss this further with the pain management.  Fibromyalgia-she has generalized pain and discomfort.  She is on tramadol.  Vitamin D deficiency-she is on vitamin D supplement.  Other medical problems are listed as follows:  Lapband APS April 2010  History of coronary artery disease  History of depression  History of hypertension  Status post colostomy ([place])  Orders: Orders Placed This Encounter  Procedures  . CBC with Differential/Platelet  . COMPLETE METABOLIC PANEL WITH GFR  . VITAMIN D 25 Hydroxy (Vit-D Deficiency, Fractures)  . Lipid panel  . Hepatic function panel   No orders of the defined types were placed in this encounter.   Face-to-face time spent with patient was 30 minutes. Greater than 50% of time was spent in counseling and coordination of care.  Follow-Up Instructions: Return in about 3 months (around 06/17/2020) for Rheumatoid arthritis,DDD.   Bo Merino, MD  Note - This record has been created using Editor, commissioning.  Chart creation errors have been sought, but may not always  have been located. Such creation errors do not reflect on  the standard  of medical care.

## 2020-03-17 ENCOUNTER — Other Ambulatory Visit: Payer: Self-pay

## 2020-03-17 ENCOUNTER — Ambulatory Visit (INDEPENDENT_AMBULATORY_CARE_PROVIDER_SITE_OTHER): Payer: BC Managed Care – PPO | Admitting: Rheumatology

## 2020-03-17 ENCOUNTER — Encounter: Payer: Self-pay | Admitting: Physician Assistant

## 2020-03-17 VITALS — BP 108/68 | HR 66 | Resp 13 | Ht 65.0 in | Wt 160.0 lb

## 2020-03-17 DIAGNOSIS — Z8659 Personal history of other mental and behavioral disorders: Secondary | ICD-10-CM

## 2020-03-17 DIAGNOSIS — D649 Anemia, unspecified: Secondary | ICD-10-CM

## 2020-03-17 DIAGNOSIS — Z9884 Bariatric surgery status: Secondary | ICD-10-CM

## 2020-03-17 DIAGNOSIS — Z79899 Other long term (current) drug therapy: Secondary | ICD-10-CM | POA: Diagnosis not present

## 2020-03-17 DIAGNOSIS — E785 Hyperlipidemia, unspecified: Secondary | ICD-10-CM

## 2020-03-17 DIAGNOSIS — Z933 Colostomy status: Secondary | ICD-10-CM

## 2020-03-17 DIAGNOSIS — M19041 Primary osteoarthritis, right hand: Secondary | ICD-10-CM

## 2020-03-17 DIAGNOSIS — Z7952 Long term (current) use of systemic steroids: Secondary | ICD-10-CM

## 2020-03-17 DIAGNOSIS — M0579 Rheumatoid arthritis with rheumatoid factor of multiple sites without organ or systems involvement: Secondary | ICD-10-CM

## 2020-03-17 DIAGNOSIS — E559 Vitamin D deficiency, unspecified: Secondary | ICD-10-CM

## 2020-03-17 DIAGNOSIS — M5136 Other intervertebral disc degeneration, lumbar region: Secondary | ICD-10-CM

## 2020-03-17 DIAGNOSIS — I10 Essential (primary) hypertension: Secondary | ICD-10-CM

## 2020-03-17 DIAGNOSIS — M19042 Primary osteoarthritis, left hand: Secondary | ICD-10-CM

## 2020-03-17 DIAGNOSIS — M797 Fibromyalgia: Secondary | ICD-10-CM

## 2020-03-17 DIAGNOSIS — Z8679 Personal history of other diseases of the circulatory system: Secondary | ICD-10-CM

## 2020-03-17 DIAGNOSIS — M5030 Other cervical disc degeneration, unspecified cervical region: Secondary | ICD-10-CM

## 2020-03-17 NOTE — Patient Instructions (Signed)
Standing Labs We placed an order today for your standing lab work.    Please come back and get your standing labs in June and every 3 months.   We have open lab daily Monday through Thursday from 8:30-12:30 PM and 1:30-4:30 PM and Friday from 8:30-12:30 PM and 1:30-4:00 PM at the office of Dr. Deetta Siegmann.   You may experience shorter wait times on Monday and Friday afternoons. The office is located at [address], [address], [place], [place] [address] No appointment is necessary.   Labs are drawn by [facility].  You may receive a bill from [facility] for your lab work.  If you wish to have your labs drawn at another location, please call the office 24 hours in advance to send orders.  If you have any questions regarding directions or hours of operation,  please call [phone].   Just as a reminder please drink plenty of water prior to coming for your lab work. Thanks!  

## 2020-03-18 ENCOUNTER — Telehealth: Payer: Self-pay | Admitting: Rheumatology

## 2020-03-18 LAB — LIPID PANEL
Cholesterol: 165 mg/dL (ref <200)
HDL: 79 mg/dL (ref >=50)
LDL Cholesterol (Calc): 68 mg/dL (calc)
Non-HDL Cholesterol (Calc): 86 mg/dL (calc) (ref <130)
Total CHOL/HDL Ratio: 2.1 (calc) (ref <5.0)
Triglycerides: 92 mg/dL (ref <150)

## 2020-03-18 LAB — COMPLETE METABOLIC PANEL WITH GFR
AG Ratio: 2 (calc) (ref 1.0–2.5)
ALT: 18 U/L (ref 6–29)
AST: 18 U/L (ref 10–35)
Albumin: 4.4 g/dL (ref 3.6–5.1)
Alkaline phosphatase (APISO): 33 U/L — ABNORMAL LOW (ref 37–153)
BUN/Creatinine Ratio: 20 (calc) (ref 6–22)
BUN: 19 mg/dL (ref 7–25)
CO2: 31 mmol/L (ref 20–32)
Calcium: 9.9 mg/dL (ref 8.6–10.4)
Chloride: 101 mmol/L (ref 98–110)
Creat: 0.95 mg/dL — ABNORMAL HIGH (ref 0.60–0.93)
GFR, Est African American: 70 mL/min/{1.73_m2} (ref >=60)
GFR, Est Non African American: 60 mL/min/{1.73_m2} (ref >=60)
Globulin: 2.2 g/dL (calc) (ref 1.9–3.7)
Glucose, Bld: 115 mg/dL — ABNORMAL HIGH (ref 65–99)
Potassium: 4 mmol/L (ref 3.5–5.3)
Sodium: 139 mmol/L (ref 135–146)
Total Bilirubin: 0.4 mg/dL (ref 0.2–1.2)
Total Protein: 6.6 g/dL (ref 6.1–8.1)

## 2020-03-18 LAB — CBC WITH DIFFERENTIAL/PLATELET
Absolute Monocytes: 290 cells/uL (ref 200–950)
Basophils Absolute: 52 cells/uL (ref 0–200)
Basophils Relative: 0.9 %
Eosinophils Absolute: 29 cells/uL (ref 15–500)
Eosinophils Relative: 0.5 %
HCT: 38.7 % (ref 35.0–45.0)
Hemoglobin: 13.2 g/dL (ref 11.7–15.5)
Lymphs Abs: 713 cells/uL — ABNORMAL LOW (ref 850–3900)
MCH: 35 pg — ABNORMAL HIGH (ref 27.0–33.0)
MCHC: 34.1 g/dL (ref 32.0–36.0)
MCV: 102.7 fL — ABNORMAL HIGH (ref 80.0–100.0)
MPV: 11.4 fL (ref 7.5–12.5)
Monocytes Relative: 5 %
Neutro Abs: 4715 cells/uL (ref 1500–7800)
Neutrophils Relative %: 81.3 %
Platelets: 202 10*3/uL (ref 140–400)
RBC: 3.77 10*6/uL — ABNORMAL LOW (ref 3.80–5.10)
RDW: 13.1 % (ref 11.0–15.0)
Total Lymphocyte: 12.3 %
WBC: 5.8 10*3/uL (ref 3.8–10.8)

## 2020-03-18 LAB — VITAMIN D 25 HYDROXY (VIT D DEFICIENCY, FRACTURES): Vit D, 25-Hydroxy: 80 ng/mL (ref 30–100)

## 2020-03-18 LAB — HEPATIC FUNCTION PANEL
AG Ratio: 2 (calc) (ref 1.0–2.5)
ALT: 18 U/L (ref 6–29)
AST: 18 U/L (ref 10–35)
Albumin: 4.4 g/dL (ref 3.6–5.1)
Alkaline phosphatase (APISO): 33 U/L — ABNORMAL LOW (ref 37–153)
Bilirubin, Direct: 0.1 mg/dL (ref 0.0–0.2)
Globulin: 2.2 g/dL (calc) (ref 1.9–3.7)
Indirect Bilirubin: 0.3 mg/dL (calc) (ref 0.2–1.2)
Total Bilirubin: 0.4 mg/dL (ref 0.2–1.2)
Total Protein: 6.6 g/dL (ref 6.1–8.1)

## 2020-03-18 NOTE — Telephone Encounter (Signed)
Labs faxed to Dr. Harwani's office.  

## 2020-03-18 NOTE — Telephone Encounter (Signed)
Patient is at Dr. Annitta Jersey office now. They are requesting labs results from yesterday to be faxed now, if possible. Fax# [phone]

## 2020-03-23 ENCOUNTER — Ambulatory Visit: Payer: BC Managed Care – PPO

## 2020-03-24 ENCOUNTER — Other Ambulatory Visit: Payer: Self-pay | Admitting: Physician Assistant

## 2020-03-24 DIAGNOSIS — Z13820 Encounter for screening for osteoporosis: Secondary | ICD-10-CM

## 2020-03-25 DIAGNOSIS — B029 Zoster without complications: Secondary | ICD-10-CM

## 2020-03-25 HISTORY — DX: Zoster without complications: B02.9

## 2020-04-06 ENCOUNTER — Other Ambulatory Visit: Payer: Self-pay | Admitting: Rheumatology

## 2020-04-06 DIAGNOSIS — M0579 Rheumatoid arthritis with rheumatoid factor of multiple sites without organ or systems involvement: Secondary | ICD-10-CM

## 2020-04-06 NOTE — Telephone Encounter (Signed)
ok 

## 2020-04-06 NOTE — Telephone Encounter (Signed)
Last Visit: 03/17/2020 Next Visit: 06/17/2020 Labs: 03/17/2020 glucose 115, creat 0.95, alkaline phosphatase 33, RBC 3.77, MCV 102.7, MCH 35.0, lymphs abs 713.  Eye exam: no baseline eye exam on file.   Patient states she did not have the eye exam in March 2021 due to having cataract surgery and now she has shingles. Patient states she will get the eye exam rescheduled as soon as she can.   Okay to refill 30 day supply of plaquenil?

## 2020-04-07 ENCOUNTER — Ambulatory Visit: Payer: BC Managed Care – PPO | Attending: Internal Medicine

## 2020-04-07 ENCOUNTER — Ambulatory Visit: Payer: BC Managed Care – PPO | Admitting: Rheumatology

## 2020-04-07 DIAGNOSIS — Z23 Encounter for immunization: Secondary | ICD-10-CM

## 2020-04-07 NOTE — Progress Notes (Signed)
   Covid-19 Vaccination Clinic  Name:  Vanessa Collins    MRN: [id number] DOB: April 14, 1946  04/07/2020  Vanessa Collins was observed post Covid-19 immunization for 15 minutes without incident. She was provided with Vaccine Information Sheet and instruction to access the V-Safe system.   Vanessa Collins was instructed to call 911 with any severe reactions post vaccine: Marland Kitchen Difficulty breathing  . Swelling of face and throat  . A fast heartbeat  . A bad rash all over body  . Dizziness and weakness   Immunizations Administered    Name Date Dose VIS Date Route   Pfizer COVID-19 Vaccine 04/07/2020  3:17 PM 0.3 mL 12/05/2019 Intramuscular   Manufacturer: [employer], [employer]   Lot: [id number]   NDC: [address]-8

## 2020-04-16 ENCOUNTER — Other Ambulatory Visit: Payer: Self-pay | Admitting: Rheumatology

## 2020-04-16 NOTE — Telephone Encounter (Signed)
Patient requesting a refill on Prednisone sent to [facility]. Patient is out, and would like to pick this up this weekend.

## 2020-04-16 NOTE — Telephone Encounter (Signed)
Last Visit: 03/17/2020  Next Visit: 06/17/2020  Okay to refill per Dr. Deveshwar.  

## 2020-04-30 ENCOUNTER — Other Ambulatory Visit: Payer: Self-pay | Admitting: Rheumatology

## 2020-04-30 ENCOUNTER — Telehealth: Payer: Self-pay | Admitting: Rheumatology

## 2020-04-30 DIAGNOSIS — M0579 Rheumatoid arthritis with rheumatoid factor of multiple sites without organ or systems involvement: Secondary | ICD-10-CM

## 2020-04-30 NOTE — Telephone Encounter (Addendum)
Last Visit: 03/17/2020 Next Visit: 06/17/2020 Labs: 03/17/2020 glucose 115, creat 0.95, alkaline phosphatase 33, RBC 3.77, MCV 102.7, MCH 35.0, lymphs abs 713.  Eye exam: 04/17/2020 normal.  Current Dose per office note on 03/17/2020: MTX 0.6 mL subcu weekly Plaquenil BID M-F  Okay to refill per Dr. Corliss Skains

## 2020-04-30 NOTE — Telephone Encounter (Signed)
Patient called checking on her prescription refills of Hydroxychloroquine and Methotrexate.  Patient was informed that they were electronically sent to [facility] today 04/30/20 at 9:05.

## 2020-05-11 ENCOUNTER — Ambulatory Visit: Payer: BC Managed Care – PPO

## 2020-05-13 ENCOUNTER — Other Ambulatory Visit: Payer: Self-pay | Admitting: Rheumatology

## 2020-05-13 NOTE — Telephone Encounter (Signed)
Last Visit: 03/17/2020  Next Visit: 06/17/2020  Okay to refill per Dr. Corliss Skains.

## 2020-05-17 ENCOUNTER — Ambulatory Visit: Payer: BC Managed Care – PPO

## 2020-05-18 ENCOUNTER — Inpatient Hospital Stay: Admission: RE | Admit: 2020-05-18 | Payer: BC Managed Care – PPO | Source: Ambulatory Visit

## 2020-05-31 ENCOUNTER — Other Ambulatory Visit: Payer: Self-pay | Admitting: Rheumatology

## 2020-05-31 DIAGNOSIS — M0579 Rheumatoid arthritis with rheumatoid factor of multiple sites without organ or systems involvement: Secondary | ICD-10-CM

## 2020-05-31 NOTE — Telephone Encounter (Signed)
Last Visit: 03/17/2020  Next Visit: 06/17/2020 Labs:03/17/2020 glucose 115, creat 0.95, alkaline phosphatase 33, RBC 3.77, MCV 102.7, MCH 35.0, lymphs abs 713. PLQ eye exam: 04/17/2020 normal.  Current Dose per office note on 03/17/2020: Plaquenil BID M-F  Okay to refill per Dr. Corliss Skains

## 2020-06-03 ENCOUNTER — Other Ambulatory Visit: Payer: Self-pay | Admitting: Rheumatology

## 2020-06-03 NOTE — Telephone Encounter (Signed)
Last Visit:03/17/2020 Next Visit:06/17/2020 Labs:03/17/2020 glucose 115, creat 0.95, alkaline phosphatase 33, RBC 3.77, MCV 102.7, MCH 35.0, lymphs abs 713  Current Dose per office note 03/17/2020: MTX 0.6 mL subcu weekly. prednisone 10 mg p.o. daily   Okay to refill per Dr. Corliss Skains

## 2020-06-03 NOTE — Progress Notes (Signed)
Office Visit Note  Patient: Vanessa Collins             Date of Birth: 01-Nov-1946           MRN: [id number]             PCP: Clovis Riley, L.August Saucer, MD Referring: Clovis Riley, Elbert Ewings.August Saucer, MD Visit Date: 06/17/2020 Occupation: @GUAROCC @  Subjective:  Pain and swelling in multiple joints.   History of Present Illness: Vanessa Collins is a 74 y.o. female with history of rheumatoid arthritis and osteoarthritis.  She states she continues to have some discomfort in her hands and her feet.  She has been walking on a daily basis.  She is doing all the chores in the house, gardening and sewing which puts increased stress on her joints.  She has noticed intermittent swelling.  She states she is unable to function below prednisone 10 mg p.o. daily.  She has been taking methotrexate 0.6 mL subcu weekly, Plaquenil 200 mg p.o. twice daily Monday to Friday and prednisone 10 mg a daily on a regular basis.  Activities of Daily Living:  Patient reports morning stiffness for 4 hours.   Patient Denies nocturnal pain.  Difficulty dressing/grooming: Denies Difficulty climbing stairs: Denies Difficulty getting out of chair: Denies Difficulty using hands for taps, buttons, cutlery, and/or writing: Reports  Review of Systems  Constitutional: Positive for fatigue. Negative for night sweats, weight gain and weight loss.  HENT: Negative for mouth sores, trouble swallowing, trouble swallowing, mouth dryness and nose dryness.   Eyes: Negative for pain, redness, itching, visual disturbance and dryness.  Respiratory: Negative for cough, shortness of breath and difficulty breathing.   Cardiovascular: Negative for chest pain, palpitations, hypertension, irregular heartbeat and swelling in legs/feet.  Gastrointestinal: Positive for diarrhea. Negative for blood in stool and constipation.  Endocrine: Negative for increased urination.  Genitourinary: Negative for difficulty urinating and vaginal dryness.  Musculoskeletal: Positive for  arthralgias, joint pain, joint swelling, myalgias, morning stiffness, muscle tenderness and myalgias. Negative for muscle weakness.  Skin: Negative for color change, rash, hair loss, redness, skin tightness, ulcers and sensitivity to sunlight.  Allergic/Immunologic: Negative for susceptible to infections.  Neurological: Positive for weakness. Negative for dizziness, numbness, headaches, memory loss and night sweats.  Hematological: Positive for bruising/bleeding tendency. Negative for swollen glands.  Psychiatric/Behavioral: Negative for depressed mood, confusion and sleep disturbance. The patient is not nervous/anxious.     PMFS History:  Patient Active Problem List   Diagnosis Date Noted  . S/P repair of ventral hernia 07/11/2019  . Hypertension   . Hyperlipidemia   . Diverticulitis of rectosigmoid s/p robotic LAR/colostomy takedown 10/09/2018 10/09/2018  . 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 History of depression 06/19/2017  . Rheumatoid arthritis involving multiple sites with positive rheumatoid factor (HCC) 01/25/2017  . High risk medication use 01/25/2017  . Immunosuppression (HCC) 01/25/2017  . Fibromyalgia 01/25/2017  . Primary osteoarthritis of both hands 01/25/2017  . Spondylosis of lumbar region without myelopathy or radiculopathy 01/25/2017  . DJD (degenerative joint disease), cervical 01/25/2017  . History of neutropenia 01/25/2017  . History of coronary artery disease 01/25/2017  . 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 Wears dentures     Past Medical History:  Diagnosis Date  . Arthritis    rheumatoid , osteo   . CAD (coronary artery disease)    PCI to LAD 2010  . Diverticulitis    with perforation and colostomy placement    .  Fibromyalgia   . Heart disease   . Heart murmur   . Hyperlipidemia   . Hypertension   . Myocardial infarction [facility]) 2008   one Stent  . Shingles 03/2020   per patient   . Vitamin D deficiency    . Wears dentures    gum disease    Family History  Problem Relation Age of Onset  . Macular degeneration Mother   . Cancer Father   . Rheum arthritis Father   . Bipolar disorder Father   . Bipolar disorder Daughter   . Rheum arthritis Daughter    Past Surgical History:  Procedure Laterality Date  . CESAREAN SECTION    . COLECTOMY WITH COLOSTOMY CREATION/HARTMANN PROCEDURE  09/2017   Perforated diverticulitis  . CORONARY STENT PLACEMENT  08/2009  . EYE SURGERY    . LAPAROSCOPIC GASTRIC BANDING  04/05/2009   Dr Daphine Deutscher  . PROCTOSCOPY N/A 10/09/2018   Procedure: RIDGID PROCTOSCOPY;  Surgeon: Karie Soda, MD;  Location: [facility];  Service: General;  Laterality: N/A;  . VAGINAL HYSTERECTOMY    . VENTRAL HERNIA REPAIR N/A 07/11/2019   Procedure: LAPAROSCOPIC ASSISTED VENTRAL HERNIA REPAIR;  Surgeon: Luretha Murphy, MD;  Location: [facility];  Service: General;  Laterality: N/A;   Social History   Social History Narrative  . Not on file   Immunization History  Administered Date(s) Administered  . PFIZER SARS-COV-2 Vaccination 02/22/2020, 04/07/2020     Objective: Vital Signs: BP 131/80 (BP Location: Left Arm, Patient Position: Sitting, Cuff Size: Normal)   Pulse 66   Resp 15   Ht 5' 2.5" (1.588 m) Comment: actual height  Wt 157 lb 3.2 oz (71.3 kg)   BMI 28.29 kg/m    Physical Exam Vitals and nursing note reviewed.  Constitutional:      Appearance: She is well-developed.  HENT:     Head: Normocephalic and atraumatic.  Eyes:     Conjunctiva/sclera: Conjunctivae normal.  Cardiovascular:     Rate and Rhythm: Normal rate and regular rhythm.     Heart sounds: Normal heart sounds.  Pulmonary:     Effort: Pulmonary effort is normal.     Breath sounds: Normal breath sounds.  Abdominal:     General: Bowel sounds are normal.     Palpations: Abdomen is soft.  Musculoskeletal:     Cervical back: Normal range of motion.  Lymphadenopathy:     Cervical: No cervical adenopathy.   Skin:    General: Skin is warm and dry.     Capillary Refill: Capillary refill takes less than 2 seconds.  Neurological:     Mental Status: She is alert and oriented to person, place, and time.  Psychiatric:        Behavior: Behavior normal.      Musculoskeletal Exam: C-spine was in good range of motion.  Shoulder joints elbow joints were in good range of motion.  She had good range of motion of bilateral wrist joints.  She has synovitis over several of her MCPs and PIPs as described below.  She has warmth and swelling in her bilateral knee joints and ankle joints.  CDAI Exam: CDAI Score: 16.6  Patient Global: 8 mm; Provider Global: 8 mm Swollen: 7 ; Tender: 12  Joint Exam 06/17/2020      Right  Left  MCP 2  Swollen Tender  Swollen Tender  MCP 5  Swollen Tender     PIP 2      Tender  PIP 3  Tender   Tender  PIP 4   Tender   Tender  Knee  Swollen Tender  Swollen Tender  Ankle  Swollen Tender  Swollen Tender     Investigation: No additional findings.  Imaging: XR Foot 2 Views Left  Result Date: 06/17/2020 Severe first MTP narrowing, PIP and DIP narrowing was noted.  MTP narrowing was noted.  Severe tibiotalar and subtalar joint space narrowing was noted.  Extra-articular posterior calcification was noted.  Inferior and posterior calcaneal spurs were noted.  Juxta-articular osteopenia was noted. Impression: These findings are consistent with rheumatoid arthritis and osteoarthritis overlap.  XR Foot 2 Views Right  Result Date: 06/17/2020 Juxta-articular osteopenia was noted.  First MTP, fourth and fifth MTP narrowing was noted.  PIP and DIP narrowing was noted.  Severe tibiotalar and subtalar joint space narrowing was noted.  Inferior and posterior calcaneal spurs were noted.  Possible erosion was noted in the fifth MTP joint. Impression: These findings are consistent with rheumatoid arthritis and osteoarthritis overlap.  XR Hand 2 View Left  Result Date: 06/17/2020 Severe  CMC narrowing and subluxation was noted.  Narrowing of all MCP joints with juxta-articular osteopenia was noted.  PIP and DIP narrowing was noted.  Intercarpal radiocarpal joint space narrowing was noted.  Ulnar styloid erosion versus cystic change was noted. Impression: These findings are consistent with rheumatoid arthritis and osteoarthritis overlap.  XR Hand 2 View Right  Result Date: 06/17/2020 Right CMC narrowing and subluxation was noted.  Narrowing of first second third and fourth MCP joints was noted.  Juxta-articular osteopenia was noted.  PIP and DIP narrowing was noted.  Mild intercarpal radiocarpal joint space narrowing was noted.  Possible erosion was noted over the ulnar styloid and third MCP joint. Impression: These findings are consistent with rheumatoid arthritis and osteoarthritis overlap.   Recent Labs: Lab Results  Component Value Date   WBC 5.8 03/17/2020   HGB 13.2 03/17/2020   PLT 202 03/17/2020   NA 139 03/17/2020   K 4.0 03/17/2020   CL 101 03/17/2020   CO2 31 03/17/2020   GLUCOSE 115 (H) 03/17/2020   BUN 19 03/17/2020   CREATININE 0.95 (H) 03/17/2020   BILITOT 0.4 03/17/2020   BILITOT 0.4 03/17/2020   ALKPHOS 33 06/20/2017   AST 18 03/17/2020   AST 18 03/17/2020   ALT 18 03/17/2020   ALT 18 03/17/2020   PROT 6.6 03/17/2020   PROT 6.6 03/17/2020   ALBUMIN 4.1 06/20/2017   CALCIUM 9.9 03/17/2020   GFRAA 70 03/17/2020    Speciality Comments: PLQ eye exam: 04/17/2020 normal. Dr. Laurence Aly. Follow up in 1 year.   Prior therapy includes: Morrie Sheldon (GI perforation), Humira (inadequate response), Enbrel (inadequate response), and Orencia (nausea and headaches)  Procedures:  No procedures performed Allergies: Vicodin [hydrocodone-acetaminophen] and Aspirin   Assessment / Plan:     Visit Diagnoses: Rheumatoid arthritis involving multiple sites with positive rheumatoid factor ([place]) -she is ongoing synovitis in multiple joints.  She is currently on  methotrexate, Plaquenil and prednisone combination.  She had an adequate response to Enbrel and Humira in the past.  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  She had intolerance to Orencia in the past.  I detailed discussion regarding adding Simponi or Cimzia but she declined.  She states she is not interested in any of the Biologics agents.  I discussed the option of adding leflunomide to her medication combination.  Indications side effects contraindications of leflunomide was discussed.  She is willing to  try leflunomide.  Handout was given and consent was taken.  We will send leflunomide 10 mg p.o. daily.  We will check labs in 2 weeks, 2 months and then every 3 months.  Plan: XR Hand 2 View Right, XR Hand 2 View Left, XR Foot 2 Views Right, XR Foot 2 Views Left  High risk medication use - MTX 0.6 mL subcu weekly, folic acid 1 mg p.o. daily, Plaquenil BID M-F, prednisone 10 mg p.o. daily (off xeljanz due to colon rupture, has colostomy  - Plan: CBC with Differential/Platelet, COMPLETE METABOLIC PANEL WITH GFR  Current chronic use of systemic steroids-she is unable to taper prednisone 10 mg daily.  Despite being on prednisone she is having significant inflammation.  Long-term use of steroids and their side effects were discussed at length.  She is noticing thinning of his skin, frequent bruising.  I also discussed increased risk of osteoporosis and use of bisphosphonates.  She is hesitant about the side effects of bisphosphonates.  I discussed the option of Prolia.  She is more inclined to try Prolia.  She is scheduled to have a bone density.  I advised her to get bone density as soon as possible so we can start her on Prolia.  I will obtain some additional labs before starting on Prolia.  Primary osteoarthritis of both hands-she is chronic discomfort.  DDD (degenerative disc disease), cervical-she has some limitation with range of motion and stiffness.  DDD (degenerative disc disease),  lumbar-the pain is manageable currently.  Screening for osteoporosis - Plan: Parathyroid hormone, intact (no Ca), Serum protein electrophoresis with reflex, TSH  Vitamin D deficiency-her last vitamin D level was normal.  Fibromyalgia-she continues to have some generalized pain.  Other medical problems are listed as follows:  History of coronary artery disease  History of depression  Lapband APS April 2010  Status post colostomy [facility])  History of hypertension  Other fatigue - Plan: Serum protein electrophoresis with reflex, TSH  Orders: Orders Placed This Encounter  Procedures  . XR Hand 2 View Right  . XR Hand 2 View Left  . XR Foot 2 Views Right  . XR Foot 2 Views Left  . CBC with Differential/Platelet  . COMPLETE METABOLIC PANEL WITH GFR  . Parathyroid hormone, intact (no Ca)  . Serum protein electrophoresis with reflex  . TSH   Meds ordered this encounter  Medications  . leflunomide (ARAVA) 10 MG tablet    Sig: Take 1 tablet (10 mg total) by mouth daily.    Dispense:  30 tablet    Refill:  2    .  Follow-Up Instructions: Return in about 6 weeks (around 07/29/2020) for Rheumatoid arthritis, Osteoarthritis.   Vanessa Savoy, MD  Note - This record has been created using Animal nutritionist.  Chart creation errors have been sought, but may not always  have been located. Such creation errors do not reflect on  the standard of medical care.

## 2020-06-17 ENCOUNTER — Ambulatory Visit (INDEPENDENT_AMBULATORY_CARE_PROVIDER_SITE_OTHER): Payer: BC Managed Care – PPO | Admitting: Rheumatology

## 2020-06-17 ENCOUNTER — Other Ambulatory Visit: Payer: Self-pay

## 2020-06-17 ENCOUNTER — Ambulatory Visit: Payer: Self-pay

## 2020-06-17 ENCOUNTER — Encounter: Payer: Self-pay | Admitting: Rheumatology

## 2020-06-17 VITALS — BP 131/80 | HR 66 | Resp 15 | Ht 62.5 in | Wt 157.2 lb

## 2020-06-17 DIAGNOSIS — M19042 Primary osteoarthritis, left hand: Secondary | ICD-10-CM

## 2020-06-17 DIAGNOSIS — Z7952 Long term (current) use of systemic steroids: Secondary | ICD-10-CM | POA: Diagnosis not present

## 2020-06-17 DIAGNOSIS — Z79899 Other long term (current) drug therapy: Secondary | ICD-10-CM

## 2020-06-17 DIAGNOSIS — M5030 Other cervical disc degeneration, unspecified cervical region: Secondary | ICD-10-CM

## 2020-06-17 DIAGNOSIS — Z933 Colostomy status: Secondary | ICD-10-CM

## 2020-06-17 DIAGNOSIS — M19071 Primary osteoarthritis, right ankle and foot: Secondary | ICD-10-CM

## 2020-06-17 DIAGNOSIS — M19072 Primary osteoarthritis, left ankle and foot: Secondary | ICD-10-CM

## 2020-06-17 DIAGNOSIS — M0579 Rheumatoid arthritis with rheumatoid factor of multiple sites without organ or systems involvement: Secondary | ICD-10-CM | POA: Diagnosis not present

## 2020-06-17 DIAGNOSIS — Z8659 Personal history of other mental and behavioral disorders: Secondary | ICD-10-CM

## 2020-06-17 DIAGNOSIS — M5136 Other intervertebral disc degeneration, lumbar region: Secondary | ICD-10-CM

## 2020-06-17 DIAGNOSIS — Z8679 Personal history of other diseases of the circulatory system: Secondary | ICD-10-CM

## 2020-06-17 DIAGNOSIS — R5383 Other fatigue: Secondary | ICD-10-CM

## 2020-06-17 DIAGNOSIS — Z13820 Encounter for screening for osteoporosis: Secondary | ICD-10-CM

## 2020-06-17 DIAGNOSIS — E559 Vitamin D deficiency, unspecified: Secondary | ICD-10-CM

## 2020-06-17 DIAGNOSIS — M19041 Primary osteoarthritis, right hand: Secondary | ICD-10-CM | POA: Diagnosis not present

## 2020-06-17 DIAGNOSIS — M797 Fibromyalgia: Secondary | ICD-10-CM

## 2020-06-17 DIAGNOSIS — Z9884 Bariatric surgery status: Secondary | ICD-10-CM

## 2020-06-17 MED ORDER — LEFLUNOMIDE 10 MG PO TABS
10.0000 mg | ORAL_TABLET | Freq: Every day | ORAL | 2 refills | Status: DC
Start: 2020-06-17 — End: 2020-09-08

## 2020-06-17 NOTE — Patient Instructions (Signed)
Standing Labs We placed an order today for your standing lab work.   Please have your standing labs drawn in 2 weeks, 2 months and then every 3 months If possible, please have your labs drawn 2 weeks prior to your appointment so that the provider can discuss your results at your appointment.  We have open lab daily Monday through Thursday from 8:30-12:30 PM and 1:30-4:30 PM and Friday from 8:30-12:30 PM and 1:30-4:00 PM at the office of Dr. Pollyann Savoy, [facility].   You may experience shorter wait times on Monday and Friday afternoons. The office is located at [address], [address], [place], [place] [address] No appointment is necessary.   Labs are drawn by [employer].  You may receive a bill from [place] for your lab work.  If you wish to have your labs drawn at another location, please call the office 24 hours in advance to send orders.  If you have any questions regarding directions or hours of operation,  please call [phone].   As a reminder, please drink plenty of water prior to coming for your lab work. Thanks!    Leflunomide tablets What is this medicine? LEFLUNOMIDE (le FLOO na mide) is for rheumatoid arthritis. This medicine may be used for other purposes; ask your health care provider or pharmacist if you have questions. COMMON BRAND NAME(S): Arava What should I tell my health care provider before I take this medicine? They need to know if you have any of these conditions:  diabetes  have a fever or infection  high blood pressure  immune system problems  kidney disease  liver disease  low blood cell counts, like low white cell, platelet, or red cell counts  lung or breathing disease, like asthma  recently received or scheduled to receive a vaccine  receiving treatment for cancer  skin conditions or sensitivity  tingling of the fingers or toes, or other nerve disorder  tuberculosis  an unusual or allergic reaction to  leflunomide, teriflunomide, other medicines, food, dyes, or preservatives  pregnant or trying to get pregnant  breast-feeding How should I use this medicine? Take this medicine by mouth with a full glass of water. Follow the directions on the prescription label. Take your medicine at regular intervals. Do not take your medicine more often than directed. Do not stop taking except on your doctor's advice. Talk to your pediatrician regarding the use of this medicine in children. Special care may be needed. Overdosage: If you think you have taken too much of this medicine contact a poison control center or emergency room at once. NOTE: This medicine is only for you. Do not share this medicine with others. What if I miss a dose? If you miss a dose, take it as soon as you can. If it is almost time for your next dose, take only that dose. Do not take double or extra doses. What may interact with this medicine? Do not take this medicine with any of the following medications:  teriflunomide This medicine may also interact with the following medications:  alosetron  birth control pills  caffeine  cefaclor  certain medicines for diabetes like nateglinide, repaglinide, rosiglitazone, pioglitazone  certain medicines for high cholesterol like atorvastatin, pravastatin, rosuvastatin, simvastatin  charcoal  cholestyramine  ciprofloxacin  duloxetine  furosemide  ketoprofen  live virus vaccines  medicines that increase your risk for infection  methotrexate  mitoxantrone  paclitaxel  penicillin  theophylline  tizanidine  warfarin This list may not describe all  possible interactions. Give your health care provider a list of all the medicines, herbs, non-prescription drugs, or dietary supplements you use. Also tell them if you smoke, drink alcohol, or use illegal drugs. Some items may interact with your medicine. What should I watch for while using this medicine? Visit your  health care provider for regular checks on your progress. Tell your doctor or health care provider if your symptoms do not start to get better or if they get worse. You may need blood work done while you are taking this medicine. This medicine may cause serious skin reactions. They can happen weeks to months after starting the medicine. Contact your health care provider right away if you notice fevers or flu-like symptoms with a rash. The rash may be red or purple and then turn into blisters or peeling of the skin. Or, you might notice a red rash with swelling of the face, lips or lymph nodes in your neck or under your arms. This medicine may stay in your body for up to 2 years after your last dose. Tell your doctor about any unusual side effects or symptoms. A medicine can be given to help lower your blood levels of this medicine more quickly. Women must use effective birth control with this medicine. There is a potential for serious side effects to an unborn child. Do not become pregnant while taking this medicine. Inform your doctor if you wish to become pregnant. This medicine remains in your blood after you stop taking it. You must continue using effective birth control until the blood levels have been checked and they are low enough. A medicine can be given to help lower your blood levels of this medicine more quickly. Immediately talk to your doctor if you think you may be pregnant. You may need a pregnancy test. Talk to your health care provider or pharmacist for more information. You should not receive certain vaccines during your treatment and for a certain time after your treatment with this medication ends. Talk to your health care provider for more information. What side effects may I notice from receiving this medicine? Side effects that you should report to your doctor or health care professional as soon as possible:  allergic reactions like skin rash, itching or hives, swelling of the face,  lips, or tongue  breathing problems  cough  increased blood pressure  low blood counts - this medicine may decrease the number of white blood cells and platelets. You may be at increased risk for infections and bleeding.  pain, tingling, numbness in the hands or feet  rash, fever, and swollen lymph nodes  redness, blistering, peeing or loosening of the skin, including inside the mouth  signs of decreased platelets or bleeding - bruising, pinpoint red spots on the skin, black, tarry stools, blood in urine  signs of infection - fever or chills, cough, sore throat, pain or trouble passing urine  signs and symptoms of liver injury like dark yellow or brown urine; general ill feeling or flu-like symptoms; light-colored stools; loss of appetite; nausea; right upper belly pain; unusually weak or tired; yellowing of the eyes or skin  trouble passing urine or change in the amount of urine  vomiting Side effects that usually do not require medical attention (report to your doctor or health care professional if they continue or are bothersome):  diarrhea  hair thinning or loss  headache  nausea  tiredness This list may not describe all possible side effects. Call your doctor for  medical advice about side effects. You may report side effects to FDA at 1-800-FDA-1088. Where should I keep my medicine? Keep out of the reach of children. Store at room temperature between 15 and 30 degrees C (59 and 86 degrees F). Protect from moisture and light. Throw away any unused medicine after the expiration date. NOTE: This sheet is a summary. It may not cover all possible information. If you have questions about this medicine, talk to your doctor, pharmacist, or health care provider.  2020 Elsevier/Gold Standard (2019-03-14 15:06:48)

## 2020-06-18 ENCOUNTER — Telehealth: Payer: Self-pay | Admitting: Rheumatology

## 2020-06-18 NOTE — Telephone Encounter (Signed)
Patient called stating after reading more about the medication she has decided she is not going to start the Leflunomide tablets.  Patient states she will stop by the office on Monday, 06/21/20 for labwork.

## 2020-06-21 ENCOUNTER — Other Ambulatory Visit: Payer: Self-pay | Admitting: Rheumatology

## 2020-06-21 NOTE — Telephone Encounter (Signed)
Please advise the patient to update lab work.  Ok to refill 30 day supply of MTX.  Ok to refill prednisone

## 2020-06-21 NOTE — Telephone Encounter (Addendum)
Verified verbal prescription with Sherron Ales, PA-C to send in MTX 0.6 mL subcu weekly #6 mL no refills  prednisone 10 mg p.o. daily #30 with no refills

## 2020-06-21 NOTE — Telephone Encounter (Signed)
Last Visit: 06/17/2020 Next visit: 07/27/2020 Labs: 03/17/2020 RBC 3.77, MCV 102.7, MCH 35.0, Lymphs Abs 713, Glucose 115, Creat. 0.95, Alk Phos 33  Current Dose per office note on 06/17/2020: MTX 0.6 mL subcu weekly prednisone 10 mg p.o. daily   Okay to refill MTX and prednisone?

## 2020-06-29 ENCOUNTER — Ambulatory Visit: Payer: BC Managed Care – PPO

## 2020-07-08 ENCOUNTER — Ambulatory Visit: Payer: BC Managed Care – PPO

## 2020-07-14 NOTE — Progress Notes (Deleted)
Office Visit Note  Patient: Vanessa Collins             Date of Birth: [birth year]           MRN: [id number]             PCP: Clovis Riley, L.August Saucer, MD Referring: Clovis Riley, Elbert Ewings.August Saucer, MD Visit Date: 07/27/2020 Occupation: @GUAROCC @  Subjective:  No chief complaint on file.   History of Present Illness: Vanessa Collins is a 74 y.o. female ***   Activities of Daily Living:  Patient reports morning stiffness for *** {minute/hour:19697}.   Patient {ACTIONS;DENIES/REPORTS:21021675::"Denies"} nocturnal pain.  Difficulty dressing/grooming: {ACTIONS;DENIES/REPORTS:21021675::"Denies"} Difficulty climbing stairs: {ACTIONS;DENIES/REPORTS:21021675::"Denies"} Difficulty getting out of chair: {ACTIONS;DENIES/REPORTS:21021675::"Denies"} Difficulty using hands for taps, buttons, cutlery, and/or writing: {ACTIONS;DENIES/REPORTS:21021675::"Denies"}  No Rheumatology ROS completed.   PMFS History:  Patient Active Problem List   Diagnosis Date Noted  . S/P repair of ventral hernia 07/11/2019  . Hypertension   . Hyperlipidemia   . Diverticulitis of rectosigmoid s/p robotic LAR/colostomy takedown 10/09/2018 10/09/2018  . 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 History of depression 06/19/2017  . Rheumatoid arthritis involving multiple sites with positive rheumatoid factor (HCC) 01/25/2017  . High risk medication use 01/25/2017  . Immunosuppression (HCC) 01/25/2017  . Fibromyalgia 01/25/2017  . Primary osteoarthritis of both hands 01/25/2017  . Spondylosis of lumbar region without myelopathy or radiculopathy 01/25/2017  . DJD (degenerative joint disease), cervical 01/25/2017  . History of neutropenia 01/25/2017  . History of coronary artery disease 01/25/2017  . 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 Wears dentures     Past Medical History:  Diagnosis Date  . Arthritis    rheumatoid , osteo   . CAD (coronary artery disease)    PCI to LAD 2010  . Diverticulitis     with perforation and colostomy placement    . Fibromyalgia   . Heart disease   . Heart murmur   . Hyperlipidemia   . Hypertension   . Myocardial infarction [facility]) 2008   one Stent  . Shingles 03/2020   per patient   . Vitamin D deficiency   . Wears dentures    gum disease    Family History  Problem Relation Age of Onset  . Macular degeneration Mother   . Cancer Father   . Rheum arthritis Father   . Bipolar disorder Father   . Bipolar disorder Daughter   . Rheum arthritis Daughter    Past Surgical History:  Procedure Laterality Date  . CESAREAN SECTION    . COLECTOMY WITH COLOSTOMY CREATION/HARTMANN PROCEDURE  09/2017   Perforated diverticulitis  . CORONARY STENT PLACEMENT  08/2009  . EYE SURGERY    . LAPAROSCOPIC GASTRIC BANDING  04/05/2009   Dr 06/05/2009  . PROCTOSCOPY N/A 10/09/2018   Procedure: RIDGID PROCTOSCOPY;  Surgeon: 10/11/2018, MD;  Location: [facility];  Service: General;  Laterality: N/A;  . VAGINAL HYSTERECTOMY    . VENTRAL HERNIA REPAIR N/A 07/11/2019   Procedure: LAPAROSCOPIC ASSISTED VENTRAL HERNIA REPAIR;  Surgeon: 07/13/2019, MD;  Location: [facility];  Service: General;  Laterality: N/A;   Social History   Social History Narrative  . Not on file   Immunization History  Administered Date(s) Administered  . PFIZER SARS-COV-2 Vaccination 02/22/2020, 04/07/2020     Objective: Vital Signs: There were no vitals taken for this visit.   Physical Exam   Musculoskeletal Exam: ***  CDAI Exam: CDAI Score: -- Patient Global: --; Provider Global: --  Swollen: --; Tender: -- Joint Exam 07/27/2020   No joint exam has been documented for this visit   There is currently no information documented on the homunculus. Go to the Rheumatology activity and complete the homunculus joint exam.  Investigation: No additional findings.  Imaging: XR Foot 2 Views Left  Result Date: 06/17/2020 Severe first MTP narrowing, PIP and DIP narrowing was noted.  MTP  narrowing was noted.  Severe tibiotalar and subtalar joint space narrowing was noted.  Extra-articular posterior calcification was noted.  Inferior and posterior calcaneal spurs were noted.  Juxta-articular osteopenia was noted. Impression: These findings are consistent with rheumatoid arthritis and osteoarthritis overlap.  XR Foot 2 Views Right  Result Date: 06/17/2020 Juxta-articular osteopenia was noted.  First MTP, fourth and fifth MTP narrowing was noted.  PIP and DIP narrowing was noted.  Severe tibiotalar and subtalar joint space narrowing was noted.  Inferior and posterior calcaneal spurs were noted.  Possible erosion was noted in the fifth MTP joint. Impression: These findings are consistent with rheumatoid arthritis and osteoarthritis overlap.  XR Hand 2 View Left  Result Date: 06/17/2020 Severe CMC narrowing and subluxation was noted.  Narrowing of all MCP joints with juxta-articular osteopenia was noted.  PIP and DIP narrowing was noted.  Intercarpal radiocarpal joint space narrowing was noted.  Ulnar styloid erosion versus cystic change was noted. Impression: These findings are consistent with rheumatoid arthritis and osteoarthritis overlap.  XR Hand 2 View Right  Result Date: 06/17/2020 Right CMC narrowing and subluxation was noted.  Narrowing of first second third and fourth MCP joints was noted.  Juxta-articular osteopenia was noted.  PIP and DIP narrowing was noted.  Mild intercarpal radiocarpal joint space narrowing was noted.  Possible erosion was noted over the ulnar styloid and third MCP joint. Impression: These findings are consistent with rheumatoid arthritis and osteoarthritis overlap.   Recent Labs: Lab Results  Component Value Date   WBC 5.8 03/17/2020   HGB 13.2 03/17/2020   PLT 202 03/17/2020   NA 139 03/17/2020   K 4.0 03/17/2020   CL 101 03/17/2020   CO2 31 03/17/2020   GLUCOSE 115 (H) 03/17/2020   BUN 19 03/17/2020   CREATININE 0.95 (H) 03/17/2020   BILITOT  0.4 03/17/2020   BILITOT 0.4 03/17/2020   ALKPHOS 33 06/20/2017   AST 18 03/17/2020   AST 18 03/17/2020   ALT 18 03/17/2020   ALT 18 03/17/2020   PROT 6.6 03/17/2020   PROT 6.6 03/17/2020   ALBUMIN 4.1 06/20/2017   CALCIUM 9.9 03/17/2020   GFRAA 70 03/17/2020    Speciality Comments: PLQ eye exam: 04/17/2020 normal. Dr. Ander Purpura. Follow up in 1 year.   Prior therapy includes: Harriette Ohara (GI perforation), Humira (inadequate response), Enbrel (inadequate response), and Orencia (nausea and headaches)  Procedures:  No procedures performed Allergies: Vicodin [hydrocodone-acetaminophen] and Aspirin   Assessment / Plan:     Visit Diagnoses: No diagnosis found.  Orders: No orders of the defined types were placed in this encounter.  No orders of the defined types were placed in this encounter.   Face-to-face time spent with patient was *** minutes. Greater than 50% of time was spent in counseling and coordination of care.  Follow-Up Instructions: No follow-ups on file.   Gearldine Bienenstock, PA-C  Note - This record has been created using Dragon software.  Chart creation errors have been sought, but may not always  have been located. Such creation errors do not reflect on  the  standard of medical care.

## 2020-07-22 ENCOUNTER — Ambulatory Visit
Admission: RE | Admit: 2020-07-22 | Discharge: 2020-07-22 | Disposition: A | Discharge status: Home / Self Care | Payer: BC Managed Care – PPO | Source: Ambulatory Visit | Attending: Physician Assistant | Admitting: Physician Assistant

## 2020-07-22 ENCOUNTER — Other Ambulatory Visit: Payer: Self-pay

## 2020-07-22 DIAGNOSIS — Z1231 Encounter for screening mammogram for malignant neoplasm of breast: Secondary | ICD-10-CM

## 2020-07-27 ENCOUNTER — Ambulatory Visit: Payer: BC Managed Care – PPO | Admitting: Physician Assistant

## 2020-08-04 ENCOUNTER — Other Ambulatory Visit: Payer: Self-pay | Admitting: Physician Assistant

## 2020-08-04 NOTE — Telephone Encounter (Signed)
Last Visit: 06/17/2020 Next visit: 09/08/2020   Current Dose per office note on 06/17/2020: prednisone 10 mg p.o. daily   Okay to refill per Dr. Corliss Skains

## 2020-08-04 NOTE — Telephone Encounter (Addendum)
Last Visit: 06/17/2020 Next visit: 09/08/2020  Labs: 03/17/2020 RBC 3.77, MCV 102.7, MCH 35.0, Lymphs Abs 713, Glucose 115, Creat. 0.95, Alk Phos 33  Current Dose per office note on 06/17/2020: MTX 0.6 mL subcu weekly.   Patient advised she is due to update labs. Patient states she wil lcome to update labs.   Okay to refill MTX?

## 2020-08-04 NOTE — Telephone Encounter (Signed)
Patient needs labs prior to the refill.

## 2020-08-06 ENCOUNTER — Telehealth: Payer: Self-pay | Admitting: Rheumatology

## 2020-08-06 NOTE — Telephone Encounter (Signed)
Patient called stating she went to the doctor yesterday and was diagnosed with Vertigo.  Patient states she was due to come in the office for labwork, but cannot drive at this time.  Patient states she will come in as soon as she is feeling better.

## 2020-08-06 NOTE — Telephone Encounter (Signed)
Noted  

## 2020-08-10 ENCOUNTER — Other Ambulatory Visit: Payer: Self-pay | Admitting: Physician Assistant

## 2020-08-11 ENCOUNTER — Other Ambulatory Visit: Payer: Self-pay | Admitting: Physician Assistant

## 2020-08-16 ENCOUNTER — Other Ambulatory Visit: Payer: Self-pay | Admitting: Rheumatology

## 2020-08-16 DIAGNOSIS — M0579 Rheumatoid arthritis with rheumatoid factor of multiple sites without organ or systems involvement: Secondary | ICD-10-CM

## 2020-08-17 ENCOUNTER — Other Ambulatory Visit: Payer: Self-pay | Admitting: Rheumatology

## 2020-08-23 ENCOUNTER — Telehealth: Payer: Self-pay | Admitting: Rheumatology

## 2020-08-23 ENCOUNTER — Other Ambulatory Visit: Payer: Self-pay | Admitting: Physician Assistant

## 2020-08-23 ENCOUNTER — Other Ambulatory Visit: Payer: Self-pay | Admitting: Rheumatology

## 2020-08-23 DIAGNOSIS — M0579 Rheumatoid arthritis with rheumatoid factor of multiple sites without organ or systems involvement: Secondary | ICD-10-CM

## 2020-08-23 NOTE — Telephone Encounter (Signed)
Patient is requesting prescription refill of Hydroxycholoroquine and Methotrexate to be sent to [facility].  Patient states she will be in tomorrow 08/24/20 to have labwork.  Patient states she has vertigo and hasn't been able to drive, but will either get someone else to drive her or try to drive herself because she needs her prescriptions refilled.

## 2020-08-23 NOTE — Telephone Encounter (Signed)
Patient advised we are unable to fill her prescriptions until she has had her labs and they have resulted. Patient verbalized understanding and states she will be in the office tomorrow to update labs.

## 2020-08-26 ENCOUNTER — Other Ambulatory Visit: Payer: Self-pay | Admitting: Rheumatology

## 2020-08-26 DIAGNOSIS — M0579 Rheumatoid arthritis with rheumatoid factor of multiple sites without organ or systems involvement: Secondary | ICD-10-CM

## 2020-08-27 ENCOUNTER — Other Ambulatory Visit: Payer: BC Managed Care – PPO

## 2020-08-27 NOTE — Progress Notes (Signed)
Office Visit Note  Patient: Vanessa Collins             Date of Birth: 10-Oct-1946           MRN: [id number]             PCP: Clovis Riley, L.August Saucer, MD Referring: Clovis Riley, Elbert Ewings.August Saucer, MD Visit Date: 09/08/2020 Occupation: @GUAROCC @  Subjective:  Vertigo   History of Present Illness: Vanessa Collins is a 74 y.o. female with history of seropositive rheumatoid arthritis, osteoarthritis, and DDD.  Patient is on PLQ 200 mg BID, methotrexate 0.6 mL subcutaneous injections once weekly and folic acid 2 mg by mouth daily.  Patient reports that she missed about 4 weeks of methotrexate recently due to being treated for an ear infection.  She resumed methotrexate 2 days ago. She is on long-term prednisone 10 mg daily.  She decided against starting on the Arava as previously recommended.  She continues to have pain and intermittent inflammation in both hands and both ankle joints.  She has occasional pain and swelling in both knees.  She takes tramadol as needed for pain relief.  Patient reports that she has been experiencing vertigo for the past 2 months which has been debilitating.  She states she has been unable to exercise and has had difficulty with ADLs due to the severity.  Activities of Daily Living:  Patient reports morning stiffness for 2-3 hours.   Patient Reports nocturnal pain.  Difficulty dressing/grooming: Reports Difficulty climbing stairs: Denies Difficulty getting out of chair: Denies Difficulty using hands for taps, buttons, cutlery, and/or writing: Reports  Review of Systems  Constitutional: Positive for fatigue.  HENT: Negative for mouth sores, mouth dryness and nose dryness.   Eyes: Negative for pain, itching and dryness.  Respiratory: Negative for shortness of breath and difficulty breathing.   Cardiovascular: Negative for chest pain and palpitations.  Gastrointestinal: Negative for blood in stool, constipation and diarrhea.  Endocrine: Negative for increased urination.  Genitourinary:  Negative for difficulty urinating.  Musculoskeletal: Positive for arthralgias, joint pain, joint swelling, muscle weakness and morning stiffness. Negative for myalgias, muscle tenderness and myalgias.  Skin: Negative for color change, rash and redness.  Allergic/Immunologic: Negative for susceptible to infections.  Neurological: Positive for dizziness, memory loss and weakness. Negative for numbness and headaches.  Hematological: Positive for bruising/bleeding tendency.  Psychiatric/Behavioral: Positive for confusion. Negative for sleep disturbance.    PMFS History:  Patient Active Problem List   Diagnosis Date Noted  . S/P repair of ventral hernia 07/11/2019  . Hypertension   . Hyperlipidemia   . Diverticulitis of rectosigmoid s/p robotic LAR/colostomy takedown 10/09/2018 10/09/2018  . 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 History of depression 06/19/2017  . Rheumatoid arthritis involving multiple sites with positive rheumatoid factor (HCC) 01/25/2017  . High risk medication use 01/25/2017  . Immunosuppression (HCC) 01/25/2017  . Fibromyalgia 01/25/2017  . Primary osteoarthritis of both hands 01/25/2017  . Spondylosis of lumbar region without myelopathy or radiculopathy 01/25/2017  . DJD (degenerative joint disease), cervical 01/25/2017  . History of neutropenia 01/25/2017  . History of coronary artery disease 01/25/2017  . 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 Wears dentures     Past Medical History:  Diagnosis Date  . Arthritis    rheumatoid , osteo   . CAD (coronary artery disease)    PCI to LAD 2010  . Diverticulitis    with perforation and colostomy placement    . Fibromyalgia   .  Heart disease   . Heart murmur   . Hyperlipidemia   . Hypertension   . Myocardial infarction [facility]) 2008   one Stent  . Shingles 03/2020   per patient   . Vertigo    per patient   . Vitamin D deficiency   . Wears dentures    gum disease     Family History  Problem Relation Age of Onset  . Macular degeneration Mother   . Cancer Father   . Rheum arthritis Father   . Bipolar disorder Father   . Bipolar disorder Daughter   . Rheum arthritis Daughter    Past Surgical History:  Procedure Laterality Date  . CESAREAN SECTION    . COLECTOMY WITH COLOSTOMY CREATION/HARTMANN PROCEDURE  09/2017   Perforated diverticulitis  . CORONARY STENT PLACEMENT  08/2009  . EYE SURGERY    . LAPAROSCOPIC GASTRIC BANDING  04/05/2009   Dr Daphine Deutscher  . PROCTOSCOPY N/A 10/09/2018   Procedure: RIDGID PROCTOSCOPY;  Surgeon: Karie Soda, MD;  Location: [facility];  Service: General;  Laterality: N/A;  . VAGINAL HYSTERECTOMY    . VENTRAL HERNIA REPAIR N/A 07/11/2019   Procedure: LAPAROSCOPIC ASSISTED VENTRAL HERNIA REPAIR;  Surgeon: Luretha Murphy, MD;  Location: [facility];  Service: General;  Laterality: N/A;   Social History   Social History Narrative  . Not on file   Immunization History  Administered Date(s) Administered  . PFIZER SARS-COV-2 Vaccination 02/22/2020, 04/07/2020     Objective: Vital Signs: BP 125/74 (BP Location: Left Arm, Patient Position: Sitting, Cuff Size: Normal)   Pulse 61   Resp 16   Ht 5\' 3"  (1.6 m)   Wt 154 lb 3.2 oz (69.9 kg)   BMI 27.32 kg/m    Physical Exam Vitals and nursing note reviewed.  Constitutional:      Appearance: She is well-developed.  HENT:     Head: Normocephalic and atraumatic.  Eyes:     Conjunctiva/sclera: Conjunctivae normal.  Pulmonary:     Effort: Pulmonary effort is normal.  Abdominal:     Palpations: Abdomen is soft.  Musculoskeletal:     Cervical back: Normal range of motion.  Skin:    General: Skin is warm and dry.     Capillary Refill: Capillary refill takes less than 2 seconds.  Neurological:     Mental Status: She is alert and oriented to person, place, and time.  Psychiatric:        Behavior: Behavior normal.      Musculoskeletal Exam:  Difficult to assess C-spine,  thoracic spine, and lumbar spine ROM due to vertigo.  Shoulder joints, elbow joints, wrist joints, MPCs, PIPs, and DIPs good ROM with on synovitis.  Tenderness and synovitis of the right 2nd MCP joint.  Knee joints good ROM with no warmth or effusion.  Tenderness and synovitis of both ankle joints.  No tenderness of MTP joints.   CDAI Exam: CDAI Score: 4.2  Patient Global: 7 mm; Provider Global: 5 mm Swollen: 3 ; Tender: 4  Joint Exam 09/08/2020      Right  Left  MCP 2  Swollen Tender     MCP 3   Tender     Ankle  Swollen Tender  Swollen Tender     Investigation: No additional findings.  Imaging: No results found.  Recent Labs: Lab Results  Component Value Date   WBC 5.6 08/31/2020   HGB 14.0 08/31/2020   PLT 195 08/31/2020   NA 140 08/31/2020   K 3.9  08/31/2020   CL 102 08/31/2020   CO2 33 (H) 08/31/2020   GLUCOSE 84 08/31/2020   BUN 18 08/31/2020   CREATININE 1.06 (H) 08/31/2020   BILITOT 0.6 08/31/2020   ALKPHOS 33 06/20/2017   AST 16 08/31/2020   ALT 12 08/31/2020   PROT 6.3 08/31/2020   PROT 6.2 08/31/2020   ALBUMIN 4.1 06/20/2017   CALCIUM 10.1 08/31/2020   GFRAA 61 08/31/2020    Speciality Comments: PLQ eye exam: 04/17/2020 normal. Dr. Ander Purpura. Follow up in 1 year.   Prior therapy includes: Harriette Ohara (GI perforation), Humira (inadequate response), Enbrel (inadequate response), and Orencia (nausea and headaches)  Procedures:  No procedures performed Allergies: Vicodin [hydrocodone-acetaminophen] and Aspirin   Assessment / Plan:     Visit Diagnoses: Rheumatoid arthritis involving multiple sites with positive rheumatoid factor (HCC) -She has persistent joint tenderness and synovitis in multiple joints as described above.  She is currently on methotrexate 0.6 mL subcutaneous injections once weekly, folic acid 1 mg by mouth daily, and Plaquenil 200 mg 1 tablet twice daily Monday through Friday.  She decided to not start on Arava as previously recommended due to  being apprehensive of potential side effects.  In the past she had an inadequate response to Enbrel and Humira.  She developed an intestinal perforation on Xeljanz and discontinued.  She had an intolerance to Orencia in the past as well.  She does not want to make any medication changes at this time.  She would like to continue on the current treatment regimen and long-term prednisone use of 10 mg daily.  She is aware of the long-term risks of prednisone use.  She will be updating her bone density on 09/28/2020.  She does not need any refills of her medications at this time.  She was advised to notify us if she develops any new or worsening symptoms.  She will follow-up in the office in 3 months.  High risk medication use - MTX 0.6 ml sq injection once weekly, folic acid 1 mg p.o. daily, Plaquenil BID M-F, prednisone 10 mg p.o. daily.  CBC and CMP were drawn on 721 and were reviewed with the patient today in the office.  She will be due to update lab work in December and every 3 months to monitor for drug toxicity. She has received both COVID-19 vaccinations and was encouraged to receive a third dose.  If she receives a third dose of the Covid vaccine she is to hold methotrexate 1 week after.  She was encouraged to continue to wear a mask and social distance.  Current chronic use of systemic steroids: She is on long-term prednisone 10 mg 1 tablet by mouth daily.  She is aware of the risks of long-term prednisone use.  She was strongly discouraged from taking any NSAIDs while on prednisone.  She has discontinued the use of Celebrex. She is scheduled for a bone density on 09/28/2020.  Prolia was discussed at her last office visit and if she is in the osteoporosis range she is willing to apply for Prolia through her insurance.  She has had all baseline lab work prior to starting on Prolia.  IFE did not reveal any monoclonal proteins.  PTH and TSH were within normal limits.  Primary osteoarthritis of both hands:  She has PIP and DIP thickening consistent with osteoarthritis of both hands.  She experiences intermittent pain and stiffness in both hands  DDD (degenerative disc disease), cervical: Difficult to assess range of motion due to  active vertigo while in the office.  She has chronic neck pain and stiffness.  She is not experiencing any symptoms of radiculopathy at this time.  DDD (degenerative disc disease), lumbar: Difficult to assess range of motion while in the office and she has active vertigo.  She is not experiencing any increased lower back pain at this time.  Screening for osteoporosis: Bone density ordered and is scheduled for 09/28/2020.  The plan is to start her on Prolia if she is in the osteoporosis range.  She is in agreement and we will apply for Prolia if she meets criteria for osteoporosis.  Vitamin D deficiency: She is taking vitamin D 5000 units daily.  Fibromyalgia: She experiences generalized myalgias and muscle tenderness during underlying fibromyalgia.  She has chronic fatigue which has been stable overall.  She takes tramadol as needed for pain relief.  Other fatigue: Chronic but stable  Vertigo -She has been experiencing vertigo on a daily basis for the past 2 months.  Her vertigo has been debilitating and has been interfering with her exercise regimen as well as ADLs.  She is evaluated by her PCP and was started on meclizine but has not noticed any improvement.  She states that her ENT has retired so we will place new, urgent referral to ENT for further evaluation and management.  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 Ambulatory referral to ENT   No orders of the defined types were placed in this encounter.    Follow-Up  Instructions: Return in about 3 months (around 12/08/2020) for Rheumatoid arthritis.   Gearldine Bienenstock, PA-C  Note - This record has been created using Dragon software.  Chart creation errors have been sought, but may not always  have been located. Such creation errors do not reflect on  the standard of medical care.

## 2020-08-31 ENCOUNTER — Other Ambulatory Visit: Payer: Self-pay

## 2020-08-31 DIAGNOSIS — Z13820 Encounter for screening for osteoporosis: Secondary | ICD-10-CM

## 2020-08-31 DIAGNOSIS — Z79899 Other long term (current) drug therapy: Secondary | ICD-10-CM

## 2020-08-31 DIAGNOSIS — R5383 Other fatigue: Secondary | ICD-10-CM

## 2020-09-01 ENCOUNTER — Other Ambulatory Visit: Payer: Self-pay | Admitting: Physician Assistant

## 2020-09-01 ENCOUNTER — Other Ambulatory Visit: Payer: Self-pay | Admitting: Rheumatology

## 2020-09-01 DIAGNOSIS — M0579 Rheumatoid arthritis with rheumatoid factor of multiple sites without organ or systems involvement: Secondary | ICD-10-CM

## 2020-09-01 MED ORDER — HYDROXYCHLOROQUINE SULFATE 200 MG PO TABS: ORAL_TABLET | ORAL | 0 refills | Status: DC

## 2020-09-01 NOTE — Telephone Encounter (Signed)
Last Visit: 06/17/2020 Next visit: 09/08/2020  Labs: 08/31/2020 creatinine is elevated, lymphocyte count is low but is stable. Patient is on methotrexate and HCTZ which could be contributing to elevated creatinine.   Current Dose per office note on 06/17/2020: MTX 0.6 mL subcu weekly Dx: Rheumatoid arthritis involving multiple sites with positive rheumatoid factor  Okay to refill MTX?

## 2020-09-01 NOTE — Addendum Note (Signed)
Addended by: Henriette Combs on: 09/01/2020 04:57 PM   Modules accepted: Orders

## 2020-09-01 NOTE — Progress Notes (Signed)
TSH is normal, creatinine is elevated, lymphocyte count is low but is stable.  Patient is on methotrexate and HCTZ which could be contributing to elevated creatinine.  We will continue to monitor labs.

## 2020-09-03 LAB — CBC WITH DIFFERENTIAL/PLATELET
Absolute Monocytes: 230 cells/uL (ref 200–950)
Basophils Absolute: 62 cells/uL (ref 0–200)
Basophils Relative: 1.1 %
Eosinophils Absolute: 22 cells/uL (ref 15–500)
Eosinophils Relative: 0.4 %
HCT: 40.8 % (ref 35.0–45.0)
Hemoglobin: 14 g/dL (ref 11.7–15.5)
Lymphs Abs: 594 cells/uL — ABNORMAL LOW (ref 850–3900)
MCH: 34.6 pg — ABNORMAL HIGH (ref 27.0–33.0)
MCHC: 34.3 g/dL (ref 32.0–36.0)
MCV: 100.7 fL — ABNORMAL HIGH (ref 80.0–100.0)
MPV: 11.3 fL (ref 7.5–12.5)
Monocytes Relative: 4.1 %
Neutro Abs: 4693 cells/uL (ref 1500–7800)
Neutrophils Relative %: 83.8 %
Platelets: 195 10*3/uL (ref 140–400)
RBC: 4.05 10*6/uL (ref 3.80–5.10)
RDW: 12.4 % (ref 11.0–15.0)
Total Lymphocyte: 10.6 %
WBC: 5.6 10*3/uL (ref 3.8–10.8)

## 2020-09-03 LAB — COMPLETE METABOLIC PANEL WITH GFR
AG Ratio: 2.1 (calc) (ref 1.0–2.5)
ALT: 12 U/L (ref 6–29)
AST: 16 U/L (ref 10–35)
Albumin: 4.2 g/dL (ref 3.6–5.1)
Alkaline phosphatase (APISO): 35 U/L — ABNORMAL LOW (ref 37–153)
BUN/Creatinine Ratio: 17 (calc) (ref 6–22)
BUN: 18 mg/dL (ref 7–25)
CO2: 33 mmol/L — ABNORMAL HIGH (ref 20–32)
Calcium: 10.1 mg/dL (ref 8.6–10.4)
Chloride: 102 mmol/L (ref 98–110)
Creat: 1.06 mg/dL — ABNORMAL HIGH (ref 0.60–0.93)
GFR, Est African American: 61 mL/min/{1.73_m2} (ref >=60)
GFR, Est Non African American: 52 mL/min/{1.73_m2} — ABNORMAL LOW (ref >=60)
Globulin: 2 g/dL (calc) (ref 1.9–3.7)
Glucose, Bld: 84 mg/dL (ref 65–99)
Potassium: 3.9 mmol/L (ref 3.5–5.3)
Sodium: 140 mmol/L (ref 135–146)
Total Bilirubin: 0.6 mg/dL (ref 0.2–1.2)
Total Protein: 6.2 g/dL (ref 6.1–8.1)

## 2020-09-03 LAB — IFE INTERPRETATION: Immunofix Electr Int: NOT DETECTED

## 2020-09-03 LAB — PROTEIN ELECTROPHORESIS, SERUM, WITH REFLEX
Albumin ELP: 3.9 g/dL (ref 3.8–4.8)
Alpha 1: 0.3 g/dL (ref 0.2–0.3)
Alpha 2: 0.7 g/dL (ref 0.5–0.9)
Beta 2: 0.3 g/dL (ref 0.2–0.5)
Beta Globulin: 0.4 g/dL (ref 0.4–0.6)
Gamma Globulin: 0.7 g/dL — ABNORMAL LOW (ref 0.8–1.7)
Total Protein: 6.3 g/dL (ref 6.1–8.1)

## 2020-09-03 LAB — PARATHYROID HORMONE, INTACT (NO CA): PTH: 21 pg/mL (ref 14–64)

## 2020-09-03 LAB — TSH: TSH: 1.66 mIU/L (ref 0.40–4.50)

## 2020-09-08 ENCOUNTER — Encounter: Payer: Self-pay | Admitting: Physician Assistant

## 2020-09-08 ENCOUNTER — Other Ambulatory Visit: Payer: Self-pay

## 2020-09-08 ENCOUNTER — Ambulatory Visit (INDEPENDENT_AMBULATORY_CARE_PROVIDER_SITE_OTHER): Payer: BC Managed Care – PPO | Admitting: Physician Assistant

## 2020-09-08 VITALS — BP 125/74 | HR 61 | Resp 16 | Ht 63.0 in | Wt 154.2 lb

## 2020-09-08 DIAGNOSIS — R42 Dizziness and giddiness: Secondary | ICD-10-CM

## 2020-09-08 DIAGNOSIS — M5030 Other cervical disc degeneration, unspecified cervical region: Secondary | ICD-10-CM

## 2020-09-08 DIAGNOSIS — R5383 Other fatigue: Secondary | ICD-10-CM

## 2020-09-08 DIAGNOSIS — Z8659 Personal history of other mental and behavioral disorders: Secondary | ICD-10-CM

## 2020-09-08 DIAGNOSIS — Z7952 Long term (current) use of systemic steroids: Secondary | ICD-10-CM

## 2020-09-08 DIAGNOSIS — M0579 Rheumatoid arthritis with rheumatoid factor of multiple sites without organ or systems involvement: Secondary | ICD-10-CM

## 2020-09-08 DIAGNOSIS — M19041 Primary osteoarthritis, right hand: Secondary | ICD-10-CM | POA: Diagnosis not present

## 2020-09-08 DIAGNOSIS — Z8679 Personal history of other diseases of the circulatory system: Secondary | ICD-10-CM

## 2020-09-08 DIAGNOSIS — Z933 Colostomy status: Secondary | ICD-10-CM

## 2020-09-08 DIAGNOSIS — Z13820 Encounter for screening for osteoporosis: Secondary | ICD-10-CM

## 2020-09-08 DIAGNOSIS — M5136 Other intervertebral disc degeneration, lumbar region: Secondary | ICD-10-CM

## 2020-09-08 DIAGNOSIS — Z79899 Other long term (current) drug therapy: Secondary | ICD-10-CM

## 2020-09-08 DIAGNOSIS — M797 Fibromyalgia: Secondary | ICD-10-CM

## 2020-09-08 DIAGNOSIS — E559 Vitamin D deficiency, unspecified: Secondary | ICD-10-CM

## 2020-09-08 DIAGNOSIS — M19042 Primary osteoarthritis, left hand: Secondary | ICD-10-CM

## 2020-09-08 DIAGNOSIS — Z9884 Bariatric surgery status: Secondary | ICD-10-CM

## 2020-09-08 NOTE — Patient Instructions (Signed)
Standing Labs We placed an order today for your standing lab work.   Please have your standing labs drawn in  December and every 3 months  If possible, please have your labs drawn 2 weeks prior to your appointment so that the provider can discuss your results at your appointment.  We have open lab daily Monday through Thursday from 8:30-12:30 PM and 1:30-4:30 PM and Friday from 8:30-12:30 PM and 1:30-4:00 PM at the office of Dr. Shaili Deveshwar, [facility].   Please be advised, patients with office appointments requiring lab work will take precedents over walk-in lab work.  If possible, please come for your lab work on Monday and Friday afternoons, as you may experience shorter wait times. The office is located at [address], [address], [place], [place] [address] No appointment is necessary.   Labs are drawn by [facility]. Please bring your co-pay at the time of your lab draw.  You may receive a bill from [facility] for your lab work.  If you wish to have your labs drawn at another location, please call the office 24 hours in advance to send orders.  If you have any questions regarding directions or hours of operation,  please call [phone].   As a reminder, please drink plenty of water prior to coming for your lab work. Thanks!  

## 2020-09-15 ENCOUNTER — Ambulatory Visit: Payer: BC Managed Care – PPO | Admitting: Rheumatology

## 2020-09-28 ENCOUNTER — Other Ambulatory Visit: Payer: BC Managed Care – PPO

## 2020-09-29 ENCOUNTER — Other Ambulatory Visit: Payer: Self-pay | Admitting: Rheumatology

## 2020-10-16 ENCOUNTER — Other Ambulatory Visit: Payer: Self-pay | Admitting: Rheumatology

## 2020-10-18 MED ORDER — PREDNISONE 5 MG PO TABS: 10.0000 mg | ORAL_TABLET | Freq: Every day | ORAL | 0 refills | Status: DC

## 2020-10-18 NOTE — Telephone Encounter (Signed)
Last Visit: 09/08/2020  Next Visit: 12/08/2020  Current Dose per office note on 09/08/2020: prednisone 10 mg 1 tablet by mouth daily

## 2020-10-19 ENCOUNTER — Ambulatory Visit (INDEPENDENT_AMBULATORY_CARE_PROVIDER_SITE_OTHER): Payer: BC Managed Care – PPO | Admitting: Otolaryngology

## 2020-10-19 ENCOUNTER — Other Ambulatory Visit: Payer: Self-pay

## 2020-10-19 ENCOUNTER — Encounter (INDEPENDENT_AMBULATORY_CARE_PROVIDER_SITE_OTHER): Payer: Self-pay | Admitting: Otolaryngology

## 2020-10-19 VITALS — Temp 97.0°F

## 2020-10-19 DIAGNOSIS — H6123 Impacted cerumen, bilateral: Secondary | ICD-10-CM

## 2020-10-19 DIAGNOSIS — H903 Sensorineural hearing loss, bilateral: Secondary | ICD-10-CM

## 2020-10-19 DIAGNOSIS — R42 Dizziness and giddiness: Secondary | ICD-10-CM

## 2020-10-19 NOTE — Progress Notes (Signed)
HPI: Vanessa Collins is a 74 y.o. female who presents is referred by PCP for evaluation of dizziness.  Patient states that she initially developed dizziness about a week after her second Covid shot.  She states that she has been chronically dizzy since that time.  She was apparently diagnosed with "vertigo" but does not really describe true vertigo on questioning her about her dizziness.  She drove herself to the office today.  On discussion with her in the office today she describes that she is having vertigo although she has no nystagmus.  She is slightly unsteady on walking. She states that she has had right ear infections in the past and that she notices slight decreased in hearing compared to the left.  Denies any ear pain presently..  Past Medical History:  Diagnosis Date  . Arthritis    rheumatoid , osteo   . CAD (coronary artery disease)    PCI to LAD 2010  . Diverticulitis    with perforation and colostomy placement    . Fibromyalgia   . Heart disease   . Heart murmur   . Hyperlipidemia   . Hypertension   . Myocardial infarction [facility]) 2008   one Stent  . Shingles 03/2020   per patient   . Vertigo    per patient   . Vitamin D deficiency   . Wears dentures    gum disease   Past Surgical History:  Procedure Laterality Date  . CESAREAN SECTION    . COLECTOMY WITH COLOSTOMY CREATION/HARTMANN PROCEDURE  09/2017   Perforated diverticulitis  . CORONARY STENT PLACEMENT  08/2009  . EYE SURGERY    . LAPAROSCOPIC GASTRIC BANDING  04/05/2009   Dr Daphine Deutscher  . PROCTOSCOPY N/A 10/09/2018   Procedure: RIDGID PROCTOSCOPY;  Surgeon: Karie Soda, MD;  Location: [facility];  Service: General;  Laterality: N/A;  . VAGINAL HYSTERECTOMY    . VENTRAL HERNIA REPAIR N/A 07/11/2019   Procedure: LAPAROSCOPIC ASSISTED VENTRAL HERNIA REPAIR;  Surgeon: Luretha Murphy, MD;  Location: [facility];  Service: General;  Laterality: N/A;   Social History   Socioeconomic History  . Marital status: Married     Spouse name: Not on file  . Number of children: Not on file  . Years of education: Not on file  . Highest education level: Not on file  Occupational History  . Not on file  Tobacco Use  . Smoking status: Former Smoker    Packs/day: 0.25    Years: 25.00    Pack years: 6.25  . Smokeless tobacco: Never Used  Vaping Use  . Vaping Use: Never used  Substance and Sexual Activity  . Alcohol use: No  . Drug use: No  . Sexual activity: Not on file  Other Topics Concern  . Not on file  Social History Narrative  . Not on file   Social Determinants of Health   Financial Resource Strain:   . Difficulty of Paying Living Expenses: Not on file  Food Insecurity:   . Worried About Programme researcher, broadcasting/film/video in the Last Year: Not on file  . Ran Out of Food in the Last Year: Not on file  Transportation Needs:   . Lack of Transportation (Medical): Not on file  . Lack of Transportation (Non-Medical): Not on file  Physical Activity:   . Days of Exercise per Week: Not on file  . Minutes of Exercise per Session: Not on file  Stress:   . Feeling of Stress : Not on file  Social Connections:   . Frequency of Communication with Friends and Family: Not on file  . Frequency of Social Gatherings with Friends and Family: Not on file  . Attends Religious Services: Not on file  . Active Member of Clubs or Organizations: Not on file  . Attends Banker Meetings: Not on file  . Marital Status: Not on file   Family History  Problem Relation Age of Onset  . Macular degeneration Mother   . Cancer Father   . Rheum arthritis Father   . Bipolar disorder Father   . Bipolar disorder Daughter   . Rheum arthritis Daughter    Allergies  Allergen Reactions  . Vicodin [Hydrocodone-Acetaminophen]     Upset stomach  . Aspirin Other (See Comments)    Stomach cramping   Prior to Admission medications   Medication Sig Start Date End Date Taking? Authorizing Provider  ALPRAZolam Prudy Feeler) 0.5 MG tablet  Take 1 mg by mouth at bedtime.    Yes [provider]  aspirin 81 MG tablet Take 81 mg by mouth daily.   Yes [provider]  cetirizine (ZYRTEC) 10 MG tablet Take 10 mg by mouth at bedtime.    Yes [provider]  Cholecalciferol (VITAMIN D3) 5000 units CAPS Take 5,000 Units by mouth daily.    Yes [provider]  estropipate (OGEN) 0.75 MG tablet Take 0.75 mg by mouth daily. 12/23/16  Yes [provider]  famotidine (PEPCID) 20 MG tablet Take 20 mg by mouth 2 (two) times daily.   Yes [provider]  folic acid (FOLVITE) 1 MG tablet TAKE 2 TABLETS BY MOUTH DAILY 09/01/20  Yes Deveshwar, Janalyn Rouse, MD  hydroxychloroquine (PLAQUENIL) 200 MG tablet TAKE 1 TABLET BY MOUTH 2 TIMES DAILY MONDAY THROUGH FRIDAY ONLY 09/01/20  Yes Deveshwar, Janalyn Rouse, MD  levothyroxine (SYNTHROID) 25 MCG tablet Take 25 mcg by mouth daily before breakfast. 05/27/19  Yes [provider]  Methotrexate Sodium (METHOTREXATE, PF,) 50 MG/2ML injection INJECT 0.6 ML INTO THE SKIN ONCE A WEEK, discard remainder of vial after use 09/01/20  Yes Deveshwar, Janalyn Rouse, MD  metoprolol succinate (TOPROL-XL) 25 MG 24 hr tablet Take 25 mg by mouth every evening. Take with or immediately following a meal.    Yes [provider]  Multiple Vitamin (MULTIVITAMIN WITH MINERALS) TABS tablet Take 1 tablet by mouth daily.   Yes [provider]  nitroGLYCERIN (NITROSTAT) 0.4 MG SL tablet Place 0.4 mg under the tongue every 5 (five) minutes as needed for chest pain.    Yes [provider]  olmesartan-hydrochlorothiazide (BENICAR HCT) 40-25 MG tablet Take 0.5 tablets by mouth daily.  01/04/17  Yes [provider]  PERCOCET 5-325 MG tablet Take 0.5-1 tablets by mouth daily as needed. 07/26/20  Yes [provider]  predniSONE (DELTASONE) 5 MG tablet Take 2 tablets (10 mg total) by mouth daily with breakfast. 10/18/20  Yes Deveshwar, Janalyn Rouse, MD  PREMARIN 0.3 MG tablet  Take 0.3 mg by mouth daily. 09/10/18  Yes [provider]  sertraline (ZOLOFT) 50 MG tablet Take 50 mg by mouth daily.    Yes [provider]  simvastatin (ZOCOR) 80 MG tablet Take 80 mg by mouth every evening.    Yes [provider]  traMADol (ULTRAM) 50 MG tablet Take 2 tablets (100 mg total) by mouth 4 (four) times daily. 07/12/19  Yes Romie Levee, MD  TUBERCULIN SYR 1CC/27GX1/2" (B-D TB SYRINGE 1CC/27GX1/2") 27G X 1/2" 1 ML MISC patient TO  inject methotrexate WEEKLY 01/15/20  Yes Deveshwar, Janalyn Rouse, MD     Positive ROS: Otherwise negative  All other systems have been reviewed and were otherwise negative with the exception of those mentioned in the HPI and as above.  Physical Exam: Constitutional: Alert, well-appearing, no acute distress Ears: External ears without lesions or tenderness.  She has mild wax buildup in both ears that was cleaned with forceps and curettes bilaterally.  The TMs were clear bilaterally with good mobility on pneumatic otoscopy.  Dix-Hallpike testing revealed no evidence of BPPV.  Patient had no nystagmus or true vertigo noted in the office today.  On hearing screening with a tuning forks she has slightly diminished hearing with a 1024 tuning fork slightly worse on the right side. Nasal: External nose without lesions.. Clear nasal passages Oral: Lips and gums without lesions. Tongue and palate mucosa without lesions. Posterior oropharynx clear. Neck: No palpable adenopathy or masses Respiratory: Breathing comfortably  Skin: No facial/neck lesions or rash noted.  Cerumen impaction removal  Date/Time: 10/19/2020 4:54 PM Performed by: Drema Halon, MD Authorized by: Drema Halon, MD   Consent:    Consent obtained:  Verbal   Consent given by:  Patient   Risks discussed:  Pain and bleeding Procedure details:    Location:  L ear and R ear   Procedure type: curette and forceps   Post-procedure details:    Inspection:   TM intact and canal normal   Hearing quality:  Improved   Patient tolerance of procedure:  Tolerated well, no immediate complications Comments:     TMs are clear bilaterally.    Assessment: Dizziness.  No evidence of BPPV in the office today Mild sensorineural hearing loss  Plan: We will plan on scheduling audiologic testing to evaluate her hearing loss.  She will follow up here following the hearing test. We will refer her to vestibular rehab to help with complaints of chronic dizziness.   Narda Bonds, MD   CC:

## 2020-10-25 ENCOUNTER — Other Ambulatory Visit: Payer: Self-pay | Admitting: Rheumatology

## 2020-11-01 ENCOUNTER — Ambulatory Visit (INDEPENDENT_AMBULATORY_CARE_PROVIDER_SITE_OTHER): Payer: BC Managed Care – PPO | Admitting: Otolaryngology

## 2020-11-04 ENCOUNTER — Other Ambulatory Visit: Payer: Self-pay | Admitting: Rheumatology

## 2020-11-04 DIAGNOSIS — M0579 Rheumatoid arthritis with rheumatoid factor of multiple sites without organ or systems involvement: Secondary | ICD-10-CM

## 2020-11-16 ENCOUNTER — Other Ambulatory Visit: Payer: Self-pay | Admitting: Rheumatology

## 2020-11-16 DIAGNOSIS — M0579 Rheumatoid arthritis with rheumatoid factor of multiple sites without organ or systems involvement: Secondary | ICD-10-CM

## 2020-11-16 NOTE — Telephone Encounter (Signed)
Last Visit: 09/08/2020 Next Visit: 12/08/2020 Labs: 08/31/2020 creatinine is elevated, lymphocyte count is low but is stable.  Eye exam: 04/17/2020 normal  Current Dose per office note 08/31/2020: Plaquenil BID M-F DX: Rheumatoid arthritis involving multiple sites with positive rheumatoid factor   Okay to refill Plaquenil?

## 2020-11-24 ENCOUNTER — Other Ambulatory Visit: Payer: Self-pay | Admitting: Rheumatology

## 2020-11-24 NOTE — Telephone Encounter (Signed)
Last Visit: 09/08/2020  Next Visit: 12/08/2020 Labs: 08/31/2020 creatinine is elevated, lymphocyte count is low but is stable.  Current Dose per office note on 09/08/2020: prednisone 10 mg 1 tablet by mouth daily MTX 0.6 ml sq injection once weekly  Okay to refill per MTX and prednisone?

## 2020-11-25 NOTE — Progress Notes (Signed)
Office Visit Note  Patient: Vanessa Collins             Date of Birth: 1945-12-28           MRN: [id number]             PCP: Clovis Riley, L.August Saucer, MD Referring: Clovis Riley, Elbert Ewings.August Saucer, MD Visit Date: 12/08/2020 Occupation: @GUAROCC @  Subjective:  Medication management.   History of Present Illness: KINBERLY PERRIS is a 74 y.o. female with history of rheumatoid arthritis, osteoarthritis and osteoporosis.  She states that she has intermittent mild flares in her hands with the current combination.  She states the combination is working well for her.  She continues to have discomfort in her hands due to underlying osteoarthritis.  She has discomfort in her lower back off and on.  She has been exercising which has been helpful.  She continues to have vertigo.  She states she will be going to a new physical therapist hopefully that will help her.  She has not to schedule her bone density yet.  Activities of Daily Living:  Patient reports morning stiffness for 3-4 hours.   Patient Denies nocturnal pain.  Difficulty dressing/grooming: Denies Difficulty climbing stairs: Denies Difficulty getting out of chair: Denies Difficulty using hands for taps, buttons, cutlery, and/or writing: Reports  Review of Systems  Constitutional: Positive for fatigue.  HENT: Negative for mouth sores, mouth dryness and nose dryness.   Eyes: Negative for pain, itching and dryness.  Respiratory: Negative for shortness of breath and difficulty breathing.   Cardiovascular: Negative for chest pain and palpitations.  Gastrointestinal: Negative for blood in stool, constipation and diarrhea.  Endocrine: Negative for increased urination.  Genitourinary: Negative for difficulty urinating.  Musculoskeletal: Positive for arthralgias, joint pain, joint swelling, myalgias, morning stiffness, muscle tenderness and myalgias.  Skin: Negative for color change, rash and redness.  Allergic/Immunologic: Negative for susceptible to infections.   Neurological: Positive for dizziness and memory loss. Negative for headaches.  Hematological: Positive for bruising/bleeding tendency.  Psychiatric/Behavioral: Positive for depressed mood. Negative for sleep disturbance. The patient is nervous/anxious.     PMFS History:  Patient Active Problem List   Diagnosis Date Noted  . S/P repair of ventral hernia 07/11/2019  . Hypertension   . Hyperlipidemia   . Diverticulitis of rectosigmoid s/p robotic LAR/colostomy takedown 10/09/2018 10/09/2018  . 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 History of depression 06/19/2017  . Rheumatoid arthritis involving multiple sites with positive rheumatoid factor (HCC) 01/25/2017  . High risk medication use 01/25/2017  . Immunosuppression (HCC) 01/25/2017  . Fibromyalgia 01/25/2017  . Primary osteoarthritis of both hands 01/25/2017  . Spondylosis of lumbar region without myelopathy or radiculopathy 01/25/2017  . DJD (degenerative joint disease), cervical 01/25/2017  . History of neutropenia 01/25/2017  . History of coronary artery disease 01/25/2017  . 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 Wears dentures     Past Medical History:  Diagnosis Date  . Arthritis    rheumatoid , osteo   . CAD (coronary artery disease)    PCI to LAD 2010  . Diverticulitis    with perforation and colostomy placement    . Fibromyalgia   . Heart disease   . Heart murmur   . Hyperlipidemia   . Hypertension   . Myocardial infarction [facility]) 2008   one Stent  . Shingles 03/2020   per patient   . Vertigo    per patient   . Vitamin D deficiency   .  Wears dentures    gum disease    Family History  Problem Relation Age of Onset  . Macular degeneration Mother   . Cancer Father   . Rheum arthritis Father   . Bipolar disorder Father   . Bipolar disorder Daughter   . Rheum arthritis Daughter    Past Surgical History:  Procedure Laterality Date  . CESAREAN SECTION    .  COLECTOMY WITH COLOSTOMY CREATION/HARTMANN PROCEDURE  09/2017   Perforated diverticulitis  . CORONARY STENT PLACEMENT  08/2009  . EYE SURGERY    . LAPAROSCOPIC GASTRIC BANDING  04/05/2009   Dr Daphine Deutscher  . PROCTOSCOPY N/A 10/09/2018   Procedure: RIDGID PROCTOSCOPY;  Surgeon: Karie Soda, MD;  Location: [facility];  Service: General;  Laterality: N/A;  . VAGINAL HYSTERECTOMY    . VENTRAL HERNIA REPAIR N/A 07/11/2019   Procedure: LAPAROSCOPIC ASSISTED VENTRAL HERNIA REPAIR;  Surgeon: Luretha Murphy, MD;  Location: [facility];  Service: General;  Laterality: N/A;   Social History   Social History Narrative  . Not on file   Immunization History  Administered Date(s) Administered  . PFIZER SARS-COV-2 Vaccination 02/22/2020, 04/07/2020     Objective: Vital Signs: BP 127/69 (BP Location: Left Arm, Patient Position: Sitting, Cuff Size: Normal)   Pulse 65   Resp 14   Ht 5\' 3"  (1.6 m)   Wt 159 lb 12.8 oz (72.5 kg)   BMI 28.31 kg/m    Physical Exam Vitals and nursing note reviewed.  Constitutional:      Appearance: She is well-developed and well-nourished.  HENT:     Head: Normocephalic and atraumatic.  Eyes:     Extraocular Movements: EOM normal.     Conjunctiva/sclera: Conjunctivae normal.  Cardiovascular:     Rate and Rhythm: Normal rate and regular rhythm.     Pulses: Intact distal pulses.     Heart sounds: Normal heart sounds.  Pulmonary:     Effort: Pulmonary effort is normal.     Breath sounds: Normal breath sounds.  Abdominal:     General: Bowel sounds are normal.     Palpations: Abdomen is soft.  Musculoskeletal:     Cervical back: Normal range of motion.  Lymphadenopathy:     Cervical: No cervical adenopathy.  Skin:    General: Skin is warm and dry.     Capillary Refill: Capillary refill takes less than 2 seconds.  Neurological:     Mental Status: She is alert and oriented to person, place, and time.  Psychiatric:        Mood and Affect: Mood and affect normal.         Behavior: Behavior normal.      Musculoskeletal Exam: C-spine was in good range of motion.  Shoulder joints, elbow joints, wrist joints with good range of motion.  She had no synovitis of her wrist joints or MCP joints.  She had bilateral CMC, PIP and DIP thickening.  Hip joints, and knee joints in good range of motion.  She had bilateral ankle joint thickening.  She had no tenderness over MTPs are PIPs or DIPs.  CDAI Exam: CDAI Score: 1  Patient Global: 8 mm; Provider Global: 2 mm Swollen: 0 ; Tender: 0  Joint Exam 12/08/2020   No joint exam has been documented for this visit   There is currently no information documented on the homunculus. Go to the Rheumatology activity and complete the homunculus joint exam.  Investigation: No additional findings.  Imaging: No results found.  Recent  Labs: Lab Results  Component Value Date   WBC 5.6 08/31/2020   HGB 14.0 08/31/2020   PLT 195 08/31/2020   NA 140 08/31/2020   K 3.9 08/31/2020   CL 102 08/31/2020   CO2 33 (H) 08/31/2020   GLUCOSE 84 08/31/2020   BUN 18 08/31/2020   CREATININE 1.06 (H) 08/31/2020   BILITOT 0.6 08/31/2020   ALKPHOS 33 06/20/2017   AST 16 08/31/2020   ALT 12 08/31/2020   PROT 6.3 08/31/2020   PROT 6.2 08/31/2020   ALBUMIN 4.1 06/20/2017   CALCIUM 10.1 08/31/2020   GFRAA 61 08/31/2020    Speciality Comments: PLQ eye exam: 04/17/2020 normal. Dr. Ander Purpura. Follow up in 1 year.   Prior therapy includes: Harriette Ohara (GI perforation), Humira (inadequate response), Enbrel (inadequate response), and Orencia (nausea and headaches)  Procedures:  No procedures performed Allergies: Vicodin [hydrocodone-acetaminophen] and Aspirin   Assessment / Plan:     Visit Diagnoses: Rheumatoid arthritis involving multiple sites with positive rheumatoid factor (HCC)-no synovitis was noted on examination today.  She comes history of intermittent swelling.  She is doing well on current combination.  High risk medication use -  MTX 0.6 ml sq injection once weekly, folic acid 1 mg p.o. daily, Plaquenil BID M-F, prednisone 10 mg p.o. daily. PLQ eye exam: 04/17/2020 - Plan: CBC with Differential/Platelet, COMPLETE METABOLIC PANEL WITH GFR today and then every 3 months to monitor for drug toxicity.  Current chronic use of systemic steroids-she understands the risk of long-term use of steroids.  Primary osteoarthritis of both hands-she has osteoarthritis involving her CMC and PIP and DIP joints.  Joint protection was discussed.  DDD (degenerative disc disease), cervical-she had good range of motion of her cervical spine.  DDD (degenerative disc disease), lumbar-she continues to have lower back pain.  Screening for osteoporosis-she has not had a DEXA scan yet.  Have advised her to get DEXA scan as soon as possible so we can start her on medications.  She is to be on a medication due to chronic use of steroids.  Vitamin D deficiency-she is taking supplement.  Fibromyalgia-she continues to have generalized pain and discomfort from fibromyalgia.  Other fatigue-related to fibromyalgia.  History of depression-she states that she feels depressed due to vertigo.  Vertigo-patient believes that she developed vertigo after the COVID-19 injection.  She does not want to get a booster.  She will be going for physical therapy.  History of hypertension-her blood pressure is stable.  Status post colostomy (HCC)-she had colostomy after diverticulitis.  History of coronary artery disease  Lapband APS April 2010  Educated about COVID-19 virus infection-she is fully vaccinated against COVID-19 but she has not received a booster.  She is hesitant to get a booster.  We had detailed discussion regarding it.  Use of mask, social distancing and hand hygiene was discussed.  Use of alcohol antibodies were discussed and patient develops COVID-19 infection.  Orders: Orders Placed This Encounter  Procedures  . CBC with Differential/Platelet   . COMPLETE METABOLIC PANEL WITH GFR   No orders of the defined types were placed in this encounter.   Follow-Up Instructions: Return in about 3 months (around 03/08/2021) for Rheumatoid arthritis, Osteoarthritis, Osteoporosis.   Pollyann Savoy, MD  Note - This record has been created using Animal nutritionist.  Chart creation errors have been sought, but may not always  have been located. Such creation errors do not reflect on  the standard of medical care.

## 2020-12-08 ENCOUNTER — Encounter: Payer: Self-pay | Admitting: Rheumatology

## 2020-12-08 ENCOUNTER — Ambulatory Visit (INDEPENDENT_AMBULATORY_CARE_PROVIDER_SITE_OTHER): Payer: BC Managed Care – PPO | Admitting: Rheumatology

## 2020-12-08 ENCOUNTER — Other Ambulatory Visit: Payer: Self-pay

## 2020-12-08 VITALS — BP 127/69 | HR 65 | Resp 14 | Ht 63.0 in | Wt 159.8 lb

## 2020-12-08 DIAGNOSIS — Z9884 Bariatric surgery status: Secondary | ICD-10-CM

## 2020-12-08 DIAGNOSIS — Z13820 Encounter for screening for osteoporosis: Secondary | ICD-10-CM

## 2020-12-08 DIAGNOSIS — Z933 Colostomy status: Secondary | ICD-10-CM

## 2020-12-08 DIAGNOSIS — M0579 Rheumatoid arthritis with rheumatoid factor of multiple sites without organ or systems involvement: Secondary | ICD-10-CM

## 2020-12-08 DIAGNOSIS — M19041 Primary osteoarthritis, right hand: Secondary | ICD-10-CM

## 2020-12-08 DIAGNOSIS — Z7952 Long term (current) use of systemic steroids: Secondary | ICD-10-CM | POA: Diagnosis not present

## 2020-12-08 DIAGNOSIS — R42 Dizziness and giddiness: Secondary | ICD-10-CM

## 2020-12-08 DIAGNOSIS — M19042 Primary osteoarthritis, left hand: Secondary | ICD-10-CM

## 2020-12-08 DIAGNOSIS — Z79899 Other long term (current) drug therapy: Secondary | ICD-10-CM | POA: Diagnosis not present

## 2020-12-08 DIAGNOSIS — M5030 Other cervical disc degeneration, unspecified cervical region: Secondary | ICD-10-CM

## 2020-12-08 DIAGNOSIS — M797 Fibromyalgia: Secondary | ICD-10-CM

## 2020-12-08 DIAGNOSIS — Z8679 Personal history of other diseases of the circulatory system: Secondary | ICD-10-CM

## 2020-12-08 DIAGNOSIS — R5383 Other fatigue: Secondary | ICD-10-CM

## 2020-12-08 DIAGNOSIS — M5136 Other intervertebral disc degeneration, lumbar region: Secondary | ICD-10-CM

## 2020-12-08 DIAGNOSIS — Z8659 Personal history of other mental and behavioral disorders: Secondary | ICD-10-CM

## 2020-12-08 DIAGNOSIS — E559 Vitamin D deficiency, unspecified: Secondary | ICD-10-CM

## 2020-12-08 DIAGNOSIS — Z7189 Other specified counseling: Secondary | ICD-10-CM

## 2020-12-08 NOTE — Patient Instructions (Signed)
Standing Labs We placed an order today for your standing lab work.   Please have your standing labs drawn in March and every 3 months  If possible, please have your labs drawn 2 weeks prior to your appointment so that the provider can discuss your results at your appointment.  We have open lab daily Monday through Thursday from 8:30-12:30 PM and 1:30-4:30 PM and Friday from 8:30-12:30 PM and 1:30-4:00 PM at the office of Dr. Salome Hautala, [facility].   Please be advised, patients with office appointments requiring lab work will take precedents over walk-in lab work.  If possible, please come for your lab work on Monday and Friday afternoons, as you may experience shorter wait times. The office is located at [address], [address], [place], [place] [address] No appointment is necessary.   Labs are drawn by [facility]. Please bring your co-pay at the time of your lab draw.  You may receive a bill from [facility] for your lab work.  If you wish to have your labs drawn at another location, please call the office 24 hours in advance to send orders.  If you have any questions regarding directions or hours of operation,  please call [phone].   As a reminder, please drink plenty of water prior to coming for your lab work. Thanks!   COVID-19 vaccine recommendations:   COVID-19 vaccine is recommended for everyone (unless you are allergic to a vaccine component), even if you are on a medication that suppresses your immune system.   If you are on Methotrexate, Cellcept (mycophenolate), Rinvoq, Xeljanz, and Olumiant- hold the medication for 1 week after each vaccine. Hold Methotrexate for 2 weeks after the single dose COVID-19 vaccine.   Do not take Tylenol or any anti-inflammatory medications (NSAIDs) 24 hours prior to the COVID-19 vaccination.   There is no direct evidence about the efficacy of the COVID-19 vaccine in individuals who are on medications that suppress the immune  system.   Even if you are fully vaccinated, and you are on any medications that suppress your immune system, please continue to wear a mask, maintain at least six feet social distance and practice hand hygiene.   If you develop a COVID-19 infection, please contact your PCP or our office to determine if you need monoclonal antibody infusion.  The booster vaccine is now available for immunocompromised patients.   Please see the following web sites for updated information.   [email or web address]     

## 2020-12-09 LAB — COMPLETE METABOLIC PANEL WITH GFR
AG Ratio: 2.2 (calc) (ref 1.0–2.5)
ALT: 26 U/L (ref 6–29)
AST: 24 U/L (ref 10–35)
Albumin: 4.1 g/dL (ref 3.6–5.1)
Alkaline phosphatase (APISO): 32 U/L — ABNORMAL LOW (ref 37–153)
BUN/Creatinine Ratio: 18 (calc) (ref 6–22)
BUN: 18 mg/dL (ref 7–25)
CO2: 33 mmol/L — ABNORMAL HIGH (ref 20–32)
Calcium: 9.6 mg/dL (ref 8.6–10.4)
Chloride: 102 mmol/L (ref 98–110)
Creat: 1 mg/dL — ABNORMAL HIGH (ref 0.60–0.93)
GFR, Est African American: 65 mL/min/{1.73_m2} (ref >=60)
GFR, Est Non African American: 56 mL/min/{1.73_m2} — ABNORMAL LOW (ref >=60)
Globulin: 1.9 g/dL (calc) (ref 1.9–3.7)
Glucose, Bld: 92 mg/dL (ref 65–99)
Potassium: 4.2 mmol/L (ref 3.5–5.3)
Sodium: 140 mmol/L (ref 135–146)
Total Bilirubin: 0.5 mg/dL (ref 0.2–1.2)
Total Protein: 6 g/dL — ABNORMAL LOW (ref 6.1–8.1)

## 2020-12-09 LAB — CBC WITH DIFFERENTIAL/PLATELET
Absolute Monocytes: 219 cells/uL (ref 200–950)
Basophils Absolute: 51 cells/uL (ref 0–200)
Basophils Relative: 1 %
Eosinophils Absolute: 20 cells/uL (ref 15–500)
Eosinophils Relative: 0.4 %
HCT: 40.2 % (ref 35.0–45.0)
Hemoglobin: 13.9 g/dL (ref 11.7–15.5)
Lymphs Abs: 785 cells/uL — ABNORMAL LOW (ref 850–3900)
MCH: 34.6 pg — ABNORMAL HIGH (ref 27.0–33.0)
MCHC: 34.6 g/dL (ref 32.0–36.0)
MCV: 100 fL (ref 80.0–100.0)
MPV: 11.4 fL (ref 7.5–12.5)
Monocytes Relative: 4.3 %
Neutro Abs: 4024 cells/uL (ref 1500–7800)
Neutrophils Relative %: 78.9 %
Platelets: 203 10*3/uL (ref 140–400)
RBC: 4.02 10*6/uL (ref 3.80–5.10)
RDW: 12.4 % (ref 11.0–15.0)
Total Lymphocyte: 15.4 %
WBC: 5.1 10*3/uL (ref 3.8–10.8)

## 2020-12-09 NOTE — Progress Notes (Signed)
CBC and CMP are stable.

## 2021-01-20 ENCOUNTER — Other Ambulatory Visit: Payer: Self-pay | Admitting: Physician Assistant

## 2021-01-20 DIAGNOSIS — M0579 Rheumatoid arthritis with rheumatoid factor of multiple sites without organ or systems involvement: Secondary | ICD-10-CM

## 2021-01-28 ENCOUNTER — Telehealth: Payer: Self-pay | Admitting: *Deleted

## 2021-01-28 NOTE — Telephone Encounter (Signed)
Ok to complete permanent handicap placard

## 2021-01-28 NOTE — Telephone Encounter (Signed)
Patient requesting disability parking placard completed and mailed to her.

## 2021-01-28 NOTE — Telephone Encounter (Signed)
Patient advised we have completed the handicap placard information and we have mailed it to her. Patient advised it may take a week or longer for her to get it.

## 2021-02-17 ENCOUNTER — Other Ambulatory Visit: Payer: Self-pay | Admitting: Physician Assistant

## 2021-02-17 ENCOUNTER — Other Ambulatory Visit: Payer: Self-pay | Admitting: Rheumatology

## 2021-02-17 DIAGNOSIS — M0579 Rheumatoid arthritis with rheumatoid factor of multiple sites without organ or systems involvement: Secondary | ICD-10-CM

## 2021-02-17 NOTE — Telephone Encounter (Signed)
Last Visit: 12/08/2020 Next Visit: 03/09/2021 Labs: 12/08/2020 CBC and CMP are stable. PLQ eye exam: 04/17/2020 normal  Current Dose per office note 12/08/2020: Plaquenil BID M-F DX: Rheumatoid arthritis involving multiple sites with positive rheumatoid factor   Last Fill: 11/16/2020  Okay to refill per Dr. Corliss Skains

## 2021-02-17 NOTE — Telephone Encounter (Signed)
Last Visit: 12/08/2020 Next Visit: 03/09/2021 Labs: 12/08/2020 CBC and CMP are stable.  Current Dose per office note 12/08/2020: MTX 0.6 ml sq injection once weekly, prednisone 10 mg p.o. daily DX: Rheumatoid arthritis involving multiple sites with positive rheumatoid factor   Last Fill: Prednisone 11/24/2020, MTX 11/24/2020  Okay to refill per Dr. Corliss Skains

## 2021-02-23 NOTE — Progress Notes (Deleted)
Office Visit Note  Patient: Vanessa Collins             Date of Birth: [birth year]           MRN: [id number]             PCP: Clovis Riley, L.August Saucer, MD Referring: Clovis Riley, Elbert Ewings.August Saucer, MD Visit Date: 03/09/2021 Occupation: @GUAROCC @  Subjective:  No chief complaint on file.   History of Present Illness: Vanessa Collins is a 75 y.o. female ***   Activities of Daily Living:  Patient reports morning stiffness for *** {minute/hour:19697}.   Patient {ACTIONS;DENIES/REPORTS:21021675::"Denies"} nocturnal pain.  Difficulty dressing/grooming: {ACTIONS;DENIES/REPORTS:21021675::"Denies"} Difficulty climbing stairs: {ACTIONS;DENIES/REPORTS:21021675::"Denies"} Difficulty getting out of chair: {ACTIONS;DENIES/REPORTS:21021675::"Denies"} Difficulty using hands for taps, buttons, cutlery, and/or writing: {ACTIONS;DENIES/REPORTS:21021675::"Denies"}  No Rheumatology ROS completed.   PMFS History:  Patient Active Problem List   Diagnosis Date Noted  . S/P repair of ventral hernia 07/11/2019  . Hypertension   . Hyperlipidemia   . Diverticulitis of rectosigmoid s/p robotic LAR/colostomy takedown 10/09/2018 10/09/2018  . 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 History of depression 06/19/2017  . Rheumatoid arthritis involving multiple sites with positive rheumatoid factor (HCC) 01/25/2017  . High risk medication use 01/25/2017  . Immunosuppression (HCC) 01/25/2017  . Fibromyalgia 01/25/2017  . Primary osteoarthritis of both hands 01/25/2017  . Spondylosis of lumbar region without myelopathy or radiculopathy 01/25/2017  . DJD (degenerative joint disease), cervical 01/25/2017  . History of neutropenia 01/25/2017  . History of coronary artery disease 01/25/2017  . 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 Wears dentures     Past Medical History:  Diagnosis Date  . Arthritis    rheumatoid , osteo   . CAD (coronary artery disease)    PCI to LAD 2010  . Diverticulitis     with perforation and colostomy placement    . Fibromyalgia   . Heart disease   . Heart murmur   . Hyperlipidemia   . Hypertension   . Myocardial infarction [facility]) 2008   one Stent  . Shingles 03/2020   per patient   . Vertigo    per patient   . Vitamin D deficiency   . Wears dentures    gum disease    Family History  Problem Relation Age of Onset  . Macular degeneration Mother   . Cancer Father   . Rheum arthritis Father   . Bipolar disorder Father   . Bipolar disorder Daughter   . Rheum arthritis Daughter    Past Surgical History:  Procedure Laterality Date  . CESAREAN SECTION    . COLECTOMY WITH COLOSTOMY CREATION/HARTMANN PROCEDURE  09/2017   Perforated diverticulitis  . CORONARY STENT PLACEMENT  08/2009  . EYE SURGERY    . LAPAROSCOPIC GASTRIC BANDING  04/05/2009   Dr 06/05/2009  . PROCTOSCOPY N/A 10/09/2018   Procedure: RIDGID PROCTOSCOPY;  Surgeon: 10/11/2018, MD;  Location: [facility];  Service: General;  Laterality: N/A;  . VAGINAL HYSTERECTOMY    . VENTRAL HERNIA REPAIR N/A 07/11/2019   Procedure: LAPAROSCOPIC ASSISTED VENTRAL HERNIA REPAIR;  Surgeon: 07/13/2019, MD;  Location: [facility];  Service: General;  Laterality: N/A;   Social History   Social History Narrative  . Not on file   Immunization History  Administered Date(s) Administered  . PFIZER(Purple Top)SARS-COV-2 Vaccination 02/22/2020, 04/07/2020     Objective: Vital Signs: There were no vitals taken for this visit.   Physical Exam   Musculoskeletal Exam: ***  CDAI Exam:  CDAI Score: -- Patient Global: --; Provider Global: -- Swollen: --; Tender: -- Joint Exam 03/09/2021   No joint exam has been documented for this visit   There is currently no information documented on the homunculus. Go to the Rheumatology activity and complete the homunculus joint exam.  Investigation: No additional findings.  Imaging: No results found.  Recent Labs: Lab Results  Component Value Date   WBC 5.1  12/08/2020   HGB 13.9 12/08/2020   PLT 203 12/08/2020   NA 140 12/08/2020   K 4.2 12/08/2020   CL 102 12/08/2020   CO2 33 (H) 12/08/2020   GLUCOSE 92 12/08/2020   BUN 18 12/08/2020   CREATININE 1.00 (H) 12/08/2020   BILITOT 0.5 12/08/2020   ALKPHOS 33 06/20/2017   AST 24 12/08/2020   ALT 26 12/08/2020   PROT 6.0 (L) 12/08/2020   ALBUMIN 4.1 06/20/2017   CALCIUM 9.6 12/08/2020   GFRAA 65 12/08/2020    Speciality Comments: PLQ eye exam: 04/17/2020 normal. Dr. Ander Purpura. Follow up in 1 year.   Prior therapy includes: Harriette Ohara (GI perforation), Humira (inadequate response), Enbrel (inadequate response), and Orencia (nausea and headaches)  Procedures:  No procedures performed Allergies: Vicodin [hydrocodone-acetaminophen] and Aspirin   Assessment / Plan:     Visit Diagnoses: No diagnosis found.  Orders: No orders of the defined types were placed in this encounter.  No orders of the defined types were placed in this encounter.   Face-to-face time spent with patient was *** minutes. Greater than 50% of time was spent in counseling and coordination of care.  Follow-Up Instructions: No follow-ups on file.   Ellen Henri, CMA  Note - This record has been created using Animal nutritionist.  Chart creation errors have been sought, but may not always  have been located. Such creation errors do not reflect on  the standard of medical care.

## 2021-03-09 ENCOUNTER — Ambulatory Visit: Payer: BC Managed Care – PPO | Admitting: Rheumatology

## 2021-03-09 DIAGNOSIS — Z7952 Long term (current) use of systemic steroids: Secondary | ICD-10-CM

## 2021-03-09 DIAGNOSIS — Z933 Colostomy status: Secondary | ICD-10-CM

## 2021-03-09 DIAGNOSIS — E559 Vitamin D deficiency, unspecified: Secondary | ICD-10-CM

## 2021-03-09 DIAGNOSIS — Z8679 Personal history of other diseases of the circulatory system: Secondary | ICD-10-CM

## 2021-03-09 DIAGNOSIS — M797 Fibromyalgia: Secondary | ICD-10-CM

## 2021-03-09 DIAGNOSIS — Z13820 Encounter for screening for osteoporosis: Secondary | ICD-10-CM

## 2021-03-09 DIAGNOSIS — R5383 Other fatigue: Secondary | ICD-10-CM

## 2021-03-09 DIAGNOSIS — M5030 Other cervical disc degeneration, unspecified cervical region: Secondary | ICD-10-CM

## 2021-03-09 DIAGNOSIS — M5136 Other intervertebral disc degeneration, lumbar region: Secondary | ICD-10-CM

## 2021-03-09 DIAGNOSIS — Z79899 Other long term (current) drug therapy: Secondary | ICD-10-CM

## 2021-03-09 DIAGNOSIS — M19041 Primary osteoarthritis, right hand: Secondary | ICD-10-CM

## 2021-03-09 DIAGNOSIS — M0579 Rheumatoid arthritis with rheumatoid factor of multiple sites without organ or systems involvement: Secondary | ICD-10-CM

## 2021-03-09 DIAGNOSIS — Z9884 Bariatric surgery status: Secondary | ICD-10-CM

## 2021-03-09 DIAGNOSIS — Z8659 Personal history of other mental and behavioral disorders: Secondary | ICD-10-CM

## 2021-03-09 DIAGNOSIS — R42 Dizziness and giddiness: Secondary | ICD-10-CM

## 2021-04-05 ENCOUNTER — Ambulatory Visit: Payer: BC Managed Care – PPO | Admitting: Physician Assistant

## 2021-04-18 ENCOUNTER — Other Ambulatory Visit: Payer: Self-pay | Admitting: Rheumatology

## 2021-04-18 DIAGNOSIS — M0579 Rheumatoid arthritis with rheumatoid factor of multiple sites without organ or systems involvement: Secondary | ICD-10-CM

## 2021-04-19 ENCOUNTER — Ambulatory Visit: Payer: BC Managed Care – PPO | Admitting: Rheumatology

## 2021-04-21 ENCOUNTER — Other Ambulatory Visit: Payer: Self-pay | Admitting: Rheumatology

## 2021-04-21 DIAGNOSIS — M0579 Rheumatoid arthritis with rheumatoid factor of multiple sites without organ or systems involvement: Secondary | ICD-10-CM

## 2021-04-26 ENCOUNTER — Other Ambulatory Visit: Payer: Self-pay

## 2021-04-26 ENCOUNTER — Encounter: Payer: Self-pay | Admitting: Physician Assistant

## 2021-04-26 ENCOUNTER — Ambulatory Visit (INDEPENDENT_AMBULATORY_CARE_PROVIDER_SITE_OTHER): Payer: BC Managed Care – PPO | Admitting: Physician Assistant

## 2021-04-26 VITALS — BP 129/85 | HR 67 | Resp 14 | Ht 63.5 in | Wt 159.0 lb

## 2021-04-26 DIAGNOSIS — Z8639 Personal history of other endocrine, nutritional and metabolic disease: Secondary | ICD-10-CM

## 2021-04-26 DIAGNOSIS — Z7952 Long term (current) use of systemic steroids: Secondary | ICD-10-CM

## 2021-04-26 DIAGNOSIS — Z79899 Other long term (current) drug therapy: Secondary | ICD-10-CM | POA: Diagnosis not present

## 2021-04-26 DIAGNOSIS — R5383 Other fatigue: Secondary | ICD-10-CM

## 2021-04-26 DIAGNOSIS — E559 Vitamin D deficiency, unspecified: Secondary | ICD-10-CM

## 2021-04-26 DIAGNOSIS — M19041 Primary osteoarthritis, right hand: Secondary | ICD-10-CM | POA: Diagnosis not present

## 2021-04-26 DIAGNOSIS — Z8679 Personal history of other diseases of the circulatory system: Secondary | ICD-10-CM

## 2021-04-26 DIAGNOSIS — Z933 Colostomy status: Secondary | ICD-10-CM

## 2021-04-26 DIAGNOSIS — R42 Dizziness and giddiness: Secondary | ICD-10-CM

## 2021-04-26 DIAGNOSIS — M797 Fibromyalgia: Secondary | ICD-10-CM

## 2021-04-26 DIAGNOSIS — M0579 Rheumatoid arthritis with rheumatoid factor of multiple sites without organ or systems involvement: Secondary | ICD-10-CM | POA: Diagnosis not present

## 2021-04-26 DIAGNOSIS — M5136 Other intervertebral disc degeneration, lumbar region: Secondary | ICD-10-CM

## 2021-04-26 DIAGNOSIS — I10 Essential (primary) hypertension: Secondary | ICD-10-CM

## 2021-04-26 DIAGNOSIS — M19042 Primary osteoarthritis, left hand: Secondary | ICD-10-CM

## 2021-04-26 DIAGNOSIS — Z9884 Bariatric surgery status: Secondary | ICD-10-CM

## 2021-04-26 DIAGNOSIS — M5030 Other cervical disc degeneration, unspecified cervical region: Secondary | ICD-10-CM

## 2021-04-26 DIAGNOSIS — Z8659 Personal history of other mental and behavioral disorders: Secondary | ICD-10-CM

## 2021-04-26 LAB — COMPLETE METABOLIC PANEL WITH GFR
AG Ratio: 2.3 (calc) (ref 1.0–2.5)
ALT: 21 U/L (ref 6–29)
AST: 22 U/L (ref 10–35)
Albumin: 4.1 g/dL (ref 3.6–5.1)
Alkaline phosphatase (APISO): 33 U/L — ABNORMAL LOW (ref 37–153)
BUN: 19 mg/dL (ref 7–25)
CO2: 33 mmol/L — ABNORMAL HIGH (ref 20–32)
Calcium: 9.7 mg/dL (ref 8.6–10.4)
Chloride: 101 mmol/L (ref 98–110)
Creat: 0.92 mg/dL (ref 0.60–0.93)
GFR, Est African American: 72 mL/min/{1.73_m2} (ref >=60)
GFR, Est Non African American: 62 mL/min/{1.73_m2} (ref >=60)
Globulin: 1.8 g/dL (calc) — ABNORMAL LOW (ref 1.9–3.7)
Glucose, Bld: 85 mg/dL (ref 65–99)
Potassium: 4.3 mmol/L (ref 3.5–5.3)
Sodium: 140 mmol/L (ref 135–146)
Total Bilirubin: 0.6 mg/dL (ref 0.2–1.2)
Total Protein: 5.9 g/dL — ABNORMAL LOW (ref 6.1–8.1)

## 2021-04-26 LAB — CBC WITH DIFFERENTIAL/PLATELET
Absolute Monocytes: 280 cells/uL (ref 200–950)
Basophils Absolute: 59 cells/uL (ref 0–200)
Basophils Relative: 0.9 %
Eosinophils Absolute: 59 cells/uL (ref 15–500)
Eosinophils Relative: 0.9 %
HCT: 40.7 % (ref 35.0–45.0)
Hemoglobin: 13.7 g/dL (ref 11.7–15.5)
Lymphs Abs: 858 cells/uL (ref 850–3900)
MCH: 34.2 pg — ABNORMAL HIGH (ref 27.0–33.0)
MCHC: 33.7 g/dL (ref 32.0–36.0)
MCV: 101.5 fL — ABNORMAL HIGH (ref 80.0–100.0)
MPV: 11.4 fL (ref 7.5–12.5)
Monocytes Relative: 4.3 %
Neutro Abs: 5246 cells/uL (ref 1500–7800)
Neutrophils Relative %: 80.7 %
Platelets: 207 10*3/uL (ref 140–400)
RBC: 4.01 10*6/uL (ref 3.80–5.10)
RDW: 12.4 % (ref 11.0–15.0)
Total Lymphocyte: 13.2 %
WBC: 6.5 10*3/uL (ref 3.8–10.8)

## 2021-04-26 MED ORDER — HYDROXYCHLOROQUINE SULFATE 200 MG PO TABS: ORAL_TABLET | ORAL | 0 refills | Status: DC

## 2021-04-26 NOTE — Progress Notes (Signed)
Office Visit Note  Patient: Vanessa Collins             Date of Birth: [birth year]           MRN: [id number]             PCP: Clovis Riley, L.August Saucer, MD Referring: Clovis Riley, Elbert Ewings.August Saucer, MD Visit Date: 04/26/2021 Occupation: @GUAROCC @  Subjective:  Medication monitoring  History of Present Illness: Vanessa Collins is a 75 y.o. female with history of rheumatoid arthritis, osteoarthritis, fibromyalgia, and DDD.  Patient is currently taking Plaquenil 200 mg 1 tablet by mouth twice daily Monday through Friday, methotrexate 0.6 mL sq injections once weekly, folic acid 2 mg by mouth daily, and prednisone 10 mg by mouth daily.  She takes tramadol 50 mg 2 tablets by mouth 4 times daily as needed for pain relief.  She continues to have chronic pain in multiple joints.  She tries to walk on a daily basis for exercise as well as practice yoga for stretching and strengthening.  She reports that the more sedentary she is the more pain and stiffness she experiences.  She is considering returning to the pool for water aerobics.  She continues to have intermittent myalgias and muscle tenderness due to fibromyalgia.  She denies any increased neck or lower back pain currently. She reports that she continues to have ongoing vertigo since receiving the second Pfizer COVID-19 vaccine on 04/07/2020.  She has seen several specialists as well as has attended physical therapy without any improvement in her symptoms.  She plans on establishing care at [facility] for further evaluation and management.  Activities of Daily Living:  Patient reports morning stiffness for 5 hours.   Patient Reports nocturnal pain.  Difficulty dressing/grooming: Denies Difficulty climbing stairs: Reports Difficulty getting out of chair: Reports Difficulty using hands for taps, buttons, cutlery, and/or writing: Reports  Review of Systems  Constitutional: Positive for fatigue.  HENT: Negative for mouth sores, mouth dryness and nose dryness.   Eyes:  Positive for dryness. Negative for pain, itching and visual disturbance.  Respiratory: Negative for cough, hemoptysis, shortness of breath and difficulty breathing.   Cardiovascular: Negative for chest pain, palpitations, hypertension and swelling in legs/feet.  Gastrointestinal: Negative for blood in stool, constipation and diarrhea.  Endocrine: Negative for increased urination.  Genitourinary: Negative for difficulty urinating and painful urination.  Musculoskeletal: Positive for arthralgias, joint pain, joint swelling, myalgias, morning stiffness, muscle tenderness and myalgias. Negative for muscle weakness.  Skin: Negative for color change, pallor, rash, hair loss, nodules/bumps, redness, skin tightness, ulcers and sensitivity to sunlight.  Allergic/Immunologic: Negative for susceptible to infections.  Neurological: Positive for dizziness and memory loss. Negative for numbness, headaches and weakness.  Hematological: Positive for bruising/bleeding tendency. Negative for swollen glands.  Psychiatric/Behavioral: Positive for confusion. Negative for depressed mood and sleep disturbance. The patient is not nervous/anxious.     PMFS History:  Patient Active Problem List   Diagnosis Date Noted  . S/P repair of ventral hernia 07/11/2019  . Hypertension   . Hyperlipidemia   . Diverticulitis of rectosigmoid s/p robotic LAR/colostomy takedown 10/09/2018 10/09/2018  . 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 History of depression 06/19/2017  . Rheumatoid arthritis involving multiple sites with positive rheumatoid factor (HCC) 01/25/2017  . High risk medication use 01/25/2017  . Immunosuppression (HCC) 01/25/2017  . Fibromyalgia 01/25/2017  . Primary osteoarthritis of both hands 01/25/2017  . Spondylosis of lumbar region without myelopathy or radiculopathy 01/25/2017  .  DJD (degenerative joint disease), cervical 01/25/2017  . History of neutropenia  01/25/2017  . History of coronary artery disease 01/25/2017  . 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 Wears dentures     Past Medical History:  Diagnosis Date  . Arthritis    rheumatoid , osteo   . CAD (coronary artery disease)    PCI to LAD 2010  . Diverticulitis    with perforation and colostomy placement    . Fibromyalgia   . Heart disease   . Heart murmur   . Hyperlipidemia   . Hypertension   . Myocardial infarction [facility]) 2008   one Stent  . Shingles 03/2020   per patient   . Vertigo    per patient   . Vitamin D deficiency   . Wears dentures    gum disease    Family History  Problem Relation Age of Onset  . Macular degeneration Mother   . Cancer Father   . Rheum arthritis Father   . Bipolar disorder Father   . Bipolar disorder Daughter   . Rheum arthritis Daughter    Past Surgical History:  Procedure Laterality Date  . CESAREAN SECTION    . COLECTOMY WITH COLOSTOMY CREATION/HARTMANN PROCEDURE  09/2017   Perforated diverticulitis  . CORONARY STENT PLACEMENT  08/2009  . EYE SURGERY    . LAPAROSCOPIC GASTRIC BANDING  04/05/2009   Dr Daphine Deutscher  . PROCTOSCOPY N/A 10/09/2018   Procedure: RIDGID PROCTOSCOPY;  Surgeon: Karie Soda, MD;  Location: [facility];  Service: General;  Laterality: N/A;  . VAGINAL HYSTERECTOMY    . VENTRAL HERNIA REPAIR N/A 07/11/2019   Procedure: LAPAROSCOPIC ASSISTED VENTRAL HERNIA REPAIR;  Surgeon: Luretha Murphy, MD;  Location: [facility];  Service: General;  Laterality: N/A;   Social History   Social History Narrative  . Not on file   Immunization History  Administered Date(s) Administered  . PFIZER(Purple Top)SARS-COV-2 Vaccination 02/22/2020, 04/07/2020     Objective: Vital Signs: BP 129/85 (BP Location: Left Arm, Patient Position: Sitting, Cuff Size: Normal)   Pulse 67   Resp 14   Ht 5' 3.5" (1.613 m)   Wt 159 lb (72.1 kg)   BMI 27.72 kg/m    Physical Exam Vitals and nursing note reviewed.  Constitutional:      Appearance: She  is well-developed.  HENT:     Head: Normocephalic and atraumatic.  Eyes:     Conjunctiva/sclera: Conjunctivae normal.  Pulmonary:     Effort: Pulmonary effort is normal.  Abdominal:     Palpations: Abdomen is soft.  Musculoskeletal:     Cervical back: Normal range of motion.  Skin:    General: Skin is warm and dry.     Capillary Refill: Capillary refill takes less than 2 seconds.  Neurological:     Mental Status: She is alert and oriented to person, place, and time.  Psychiatric:        Behavior: Behavior normal.      Musculoskeletal Exam: C-spine has slightly limited range of motion with lateral rotation.  No midline spinal tenderness or SI joint tenderness.  Shoulder joints, elbow joints, and wrist joints have good range of motion with no discomfort.  She has some tenderness palpation on the dorsal aspect of the right wrist.  Synovial thickening of the right wrist noted.  Tenderness and synovial thickening over the right second and third MCP joints.  She has PIP and DIP thickening consistent with osteoarthritis of both hands.  Knee joints have good  range of motion with no warmth or effusion.  She has synovial thickening of both ankle joints.  Tenderness and warmth of the left ankle joint noted on examination today.   CDAI Exam: CDAI Score: 4.4  Patient Global: 7 mm; Provider Global: 7 mm Swollen: 1 ; Tender: 4  Joint Exam 04/26/2021      Right  Left  Wrist   Tender     MCP 2   Tender     MCP 3   Tender     Ankle     Swollen Tender     Investigation: No additional findings.  Imaging: No results found.  Recent Labs: Lab Results  Component Value Date   WBC 5.1 12/08/2020   HGB 13.9 12/08/2020   PLT 203 12/08/2020   NA 140 12/08/2020   K 4.2 12/08/2020   CL 102 12/08/2020   CO2 33 (H) 12/08/2020   GLUCOSE 92 12/08/2020   BUN 18 12/08/2020   CREATININE 1.00 (H) 12/08/2020   BILITOT 0.5 12/08/2020   ALKPHOS 33 06/20/2017   AST 24 12/08/2020   ALT 26 12/08/2020    PROT 6.0 (L) 12/08/2020   ALBUMIN 4.1 06/20/2017   CALCIUM 9.6 12/08/2020   GFRAA 65 12/08/2020    Speciality Comments: PLQ eye exam: 04/17/2020 normal. Dr. Ander Purpura. Follow up in 1 year.   Prior therapy includes: Harriette Ohara (GI perforation), Humira (inadequate response), Enbrel (inadequate response), and Orencia (nausea and headaches)  Procedures:  No procedures performed Allergies: Vicodin [hydrocodone-acetaminophen] and Aspirin   Assessment / Plan:     Visit Diagnoses: Rheumatoid arthritis involving multiple sites with positive rheumatoid factor (HCC) -She continues to experience intermittent pain and stiffness in multiple joints.  She has tenderness and warmth of the left ankle joint on examination today.  She is currently on methotrexate 0.6 milliliters subcu injections once weekly, folic acid 1 mg by mouth daily, Plaquenil 200 mg 1 tablet by mouth twice daily Monday through Friday, and prednisone 10 mg daily.  Overall her discomfort has been manageable on the current treatment regimen.  No medication changes will be made at this time.  A refill of Plaquenil was sent to the pharmacy.  She was advised to notify us if she develops increased joint pain or joint swelling.  She will follow-up in the office in 5 months.  Plan: hydroxychloroquine (PLAQUENIL) 200 MG tablet  High risk medication use - Methotrexate 0.6 ml sq injection once weekly, folic acid 1 mg p.o. daily, Plaquenil 200 mg 1 tablet by mouth BID M-F, prednisone 10 mg p.o. daily. PLQ eye exam: 04/17/2020.  She was advised to schedule an updated Plaquenil eye exam.  She was given a Plaquenil eye exam form to take with her to her upcoming appointment.  CBC and CMP were drawn on 12/08/2020.  She is due to update lab work.  Orders for CBC and CMP were released.  Her next lab work will be due in August and every 3 months to monitor for drug toxicity.- Plan: CBC with Differential/Platelet, COMPLETE METABOLIC PANEL WITH GFR Previous therapy:  Harriette Ohara discontinued after GI perforation.  Inadequate response to Enbrel and Humira.  Discontinued Orencia due to nausea and headaches. She has not had any recent infections. She has received 2 Pfizer COVID-19 vaccine doses.  She does not plan on receiving the booster due to developing vertigo after the second vaccine.  Current chronic use of systemic steroids: She is currently on prednisone 10 mg daily.  She is aware of  the long-term risks of prednisone use.  She was advised to reach out to her PCP for a an updated DEXA order.  Primary osteoarthritis of both hands: She has PIP and DIP thickening consistent with osteoarthritis of both hands.  She experiences intermittent pain and stiffness in both hands.  We discussed the importance of joint protection and muscle strengthening.  We also discussed the use of arthritis compression gloves.  DDD (degenerative disc disease), cervical: She has limited range of motion with lateral rotation bilaterally.  She performs neck exercises on a daily basis.  She is not experiencing any symptoms of radiculopathy at this time.  DDD (degenerative disc disease), lumbar: She is not currently experiencing any lower back pain.  She has no symptoms of radiculopathy.  No midline spinal tenderness.  Vitamin D deficiency: She is taking vitamin D 5000 units daily.  Fibromyalgia: She experiences intermittent myalgias and muscle tenderness due to underlying fibromyalgia.  Overall her pain has been manageable.  She takes tramadol 50 mg 2 tablets 4 times daily and Percocet as needed for pain relief.  She is also started to walk on a daily basis for exercise and practices yoga.  We discussed the importance of regular exercise and good sleep hygiene.  Other fatigue: Stable.  Encouraged to increase her exercise regimen.  She plans on going back to the pool for water aerobics.  Other medical conditions are listed as follows:   History of depression  Vertigo: She has ongoing  vertigo since receiving the second pfizer covid-19 vaccine on 04/07/20.  She has seen several specialists as well as has attended physical therapy without any improvement in her symptoms.  She plans on establishing care at [facility] for further evaluation and management.  Status post colostomy [facility])  History of coronary artery disease  Lapband APS April 2010  Essential hypertension, benign  History of hyperlipidemia  Orders: Orders Placed This Encounter  Procedures  . CBC with Differential/Platelet  . COMPLETE METABOLIC PANEL WITH GFR   Meds ordered this encounter  Medications  . hydroxychloroquine (PLAQUENIL) 200 MG tablet    Sig: Take 1 tablet by mouth twice daily Monday through Friday only.    Dispense:  120 tablet    Refill:  0     Follow-Up Instructions: Return in about 5 months (around 09/26/2021) for Rheumatoid arthritis, Osteoarthritis, Fibromyalgia, DDD.   Gearldine Bienenstock, PA-C  Note - This record has been created using Dragon software.  Chart creation errors have been sought, but may not always  have been located. Such creation errors do not reflect on  the standard of medical care.

## 2021-04-26 NOTE — Patient Instructions (Signed)
Standing Labs We placed an order today for your standing lab work.   Please have your standing labs drawn in August and every 3 months   If possible, please have your labs drawn 2 weeks prior to your appointment so that the provider can discuss your results at your appointment.  We have open lab daily Monday through Thursday from 1:30-4:30 PM and Friday from 1:30-4:00 PM at the office of Dr. Shaili Deveshwar, [facility].   Please be advised, all patients with office appointments requiring lab work will take precedents over walk-in lab work.  If possible, please come for your lab work on Monday and Friday afternoons, as you may experience shorter wait times. The office is located at [address], [address], [place], [place] [address] No appointment is necessary.   Labs are drawn by [facility]. Please bring your co-pay at the time of your lab draw.  You may receive a bill from [facility] for your lab work.  If you wish to have your labs drawn at another location, please call the office 24 hours in advance to send orders.  If you have any questions regarding directions or hours of operation,  please call [phone].   As a reminder, please drink plenty of water prior to coming for your lab work. Thanks!   

## 2021-04-27 NOTE — Progress Notes (Signed)
MCV and Mch are slightly elevated.  Please advise the patient to take folic acid 2 mg daily.  Rest of CBC WNL.   Total protein is slightly low and trending down.  Please place future order for SPEP to be drawn with her next lab work.   Alk phos is low but stable.  Rest of CMP WNL.  We will continue to monitor.

## 2021-04-28 ENCOUNTER — Telehealth: Payer: Self-pay

## 2021-04-28 ENCOUNTER — Other Ambulatory Visit: Payer: Self-pay | Admitting: *Deleted

## 2021-04-28 ENCOUNTER — Other Ambulatory Visit: Payer: Self-pay | Admitting: Rheumatology

## 2021-04-28 DIAGNOSIS — R42 Dizziness and giddiness: Secondary | ICD-10-CM

## 2021-04-28 DIAGNOSIS — M0579 Rheumatoid arthritis with rheumatoid factor of multiple sites without organ or systems involvement: Secondary | ICD-10-CM

## 2021-04-28 DIAGNOSIS — Z79899 Other long term (current) drug therapy: Secondary | ICD-10-CM

## 2021-04-28 NOTE — Telephone Encounter (Signed)
Okay to place referral

## 2021-04-28 NOTE — Telephone Encounter (Signed)
Patient called stating she tried to schedule an appointment at [facility] for her vertigo, but was told they needed a referral.  Patient states she doesn't have a preference on doctors.  Their phone #[phone], fax #[phone]

## 2021-04-29 NOTE — Telephone Encounter (Signed)
Next Visit: 07/19/2021  Last Visit: 04/26/2021  Last Fill: 01/15/2020  Dx:  Rheumatoid arthritis involving multiple sites with positive rheumatoid factor   Current Dose per office note on 04/26/2021, injection once weekly  Okay to refill syringes?

## 2021-04-29 NOTE — Telephone Encounter (Signed)
Referral has been placed and faxed. I attempted to contact the patient and left message on machine to advise the patient.

## 2021-04-29 NOTE — Telephone Encounter (Signed)
Ok to place a referral to [facility] for evaluation of vertigo.

## 2021-05-04 ENCOUNTER — Other Ambulatory Visit: Payer: Self-pay | Admitting: Rheumatology

## 2021-05-04 NOTE — Telephone Encounter (Signed)
Next Visit: 07/19/2021  Last Visit: 04/26/2021  Last Fill: 02/17/2021 MTX and prednisone, 3/0/0511 folic acid  DX:  Rheumatoid arthritis involving multiple sites with positive rheumatoid factor   Current Dose per office note 04/26/2021, Methotrexate 0.6 ml sq injection once weekly, prednisone 10 mg daily, 04/27/2021 labs Please advise the patient to take folic acid 2 mg daily.  Labs: 04/26/2021, MCV and Mch are slightly elevated. Please advise the patient to take folic acid 2 mg daily. Rest of CBC WNL.   Total protein is slightly low and trending down. Please place future order for SPEP to be drawn with her next lab work.  Alk phos is low but stable.  Rest of CMP WNL. We will continue to monitor  Okay to refill MTX, folic acid and prednisone?

## 2021-07-05 NOTE — Progress Notes (Signed)
Office Visit Note  Patient: Vanessa Collins             Date of Birth: [birth year]           MRN: [id number]             PCP: Clovis Riley, L.August Saucer, MD Referring: Clovis Riley, Elbert Ewings.August Saucer, MD Visit Date: 07/19/2021 Occupation: @  Subjective:  Medication management.   History of Present Illness: Vanessa Collins is a 75 y.o. female with a history of rheumatoid arthritis, osteoarthritis and fibromyalgia.  She states that she is unable to taper prednisone.  She has been taking prednisone from 7.5 to 10 mg p.o. daily.  She has been taking methotrexate and Plaquenil on a regular basis.  She continues to have pain and stiffness in her ankle joints off and on.  She has intermittent discomfort in her hands.  She continues to have vertigo since she had COVID-19 injection.  She has an appointment with Dr. Sharyn Lull today for the evaluation of hypotension.  Activities of Daily Living:  Patient reports morning stiffness for 4 hours.   Patient Reports nocturnal pain.  Difficulty dressing/grooming: Denies Difficulty climbing stairs: Denies Difficulty getting out of chair: Denies Difficulty using hands for taps, buttons, cutlery, and/or writing: Reports  Review of Systems  Constitutional:  Positive for fatigue.  HENT:  Negative for mouth sores, mouth dryness and nose dryness.   Eyes:  Positive for dryness. Negative for pain and itching.  Respiratory:  Negative for shortness of breath and difficulty breathing.   Cardiovascular:  Negative for chest pain and palpitations.  Gastrointestinal:  Negative for blood in stool, constipation and diarrhea.  Endocrine: Negative for increased urination.  Genitourinary:  Negative for difficulty urinating.  Musculoskeletal:  Positive for joint pain, joint pain and morning stiffness. Negative for joint swelling, myalgias, muscle tenderness and myalgias.  Skin:  Negative for color change, rash and redness.  Allergic/Immunologic: Negative for susceptible to infections.   Neurological:  Positive for numbness. Negative for dizziness, headaches, memory loss and weakness.  Hematological:  Positive for bruising/bleeding tendency.  Psychiatric/Behavioral:  Negative for confusion.    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 10/09/2018 10/09/2018   History of depression 06/19/2017   Rheumatoid arthritis involving multiple sites with positive rheumatoid factor (HCC) 01/25/2017   High risk medication use 01/25/2017   Immunosuppression (HCC) 01/25/2017   Fibromyalgia 01/25/2017   Primary osteoarthritis of both hands 01/25/2017   Spondylosis of lumbar region without myelopathy or radiculopathy 01/25/2017   DJD (degenerative joint disease), cervical 01/25/2017   History of neutropenia 01/25/2017   History of coronary artery disease 01/25/2017   Lapband APS April 2010 02/25/2013   Wears dentures     Past Medical History:  Diagnosis Date   Arthritis    rheumatoid , osteo    CAD (coronary artery disease)    PCI to LAD 2010   Diverticulitis    with perforation and colostomy placement     Fibromyalgia    Heart disease    Heart murmur    Hyperlipidemia    Hypertension    Myocardial infarction [facility]) 2008   one Stent   Shingles 03/2020   per patient    Vertigo    per patient    Vitamin  D deficiency    Wears dentures    gum disease    Family History  Problem Relation Age of Onset   Macular degeneration Mother    Cancer Father    Rheum arthritis Father    Bipolar disorder Father    Bipolar disorder Daughter    Rheum arthritis Daughter    Past Surgical History:  Procedure Laterality Date   CESAREAN SECTION     COLECTOMY WITH COLOSTOMY CREATION/HARTMANN PROCEDURE  09/2017   Perforated diverticulitis    CORONARY STENT PLACEMENT  08/2009   EYE SURGERY     LAPAROSCOPIC GASTRIC BANDING  04/05/2009   Dr Daphine Deutscher   PROCTOSCOPY N/A 10/09/2018   Procedure: RIDGID PROCTOSCOPY;  Surgeon: Karie Soda, MD;  Location: [facility];  Service: General;  Laterality: N/A;   VAGINAL HYSTERECTOMY     VENTRAL HERNIA REPAIR N/A 07/11/2019   Procedure: LAPAROSCOPIC ASSISTED VENTRAL HERNIA REPAIR;  Surgeon: Luretha Murphy, MD;  Location: [facility];  Service: General;  Laterality: N/A;   Social History   Social History Narrative   Not on file   Immunization History  Administered Date(s) Administered   PFIZER(Purple Top)SARS-COV-2 Vaccination 02/22/2020, 04/07/2020     Objective: Vital Signs: BP (!) 92/57 (BP Location: Left Arm, Patient Position: Sitting, Cuff Size: Normal)   Pulse 71   Ht 5\' 3"  (1.6 m)   Wt 151 lb 12.8 oz (68.9 kg)   BMI 26.89 kg/m    Physical Exam Vitals and nursing note reviewed.  Constitutional:      Appearance: She is well-developed.  HENT:     Head: Normocephalic and atraumatic.  Eyes:     Conjunctiva/sclera: Conjunctivae normal.  Cardiovascular:     Rate and Rhythm: Normal rate and regular rhythm.     Heart sounds: Normal heart sounds.  Pulmonary:     Effort: Pulmonary effort is normal.     Breath sounds: Normal breath sounds.  Abdominal:     General: Bowel sounds are normal.     Palpations: Abdomen is soft.  Musculoskeletal:     Cervical back: Normal range of motion.  Lymphadenopathy:     Cervical: No cervical adenopathy.  Skin:    General: Skin is warm and dry.     Capillary Refill: Capillary refill takes less than 2 seconds.  Neurological:     Mental Status: She is alert and oriented to person, place, and time.  Psychiatric:        Behavior: Behavior normal.     Musculoskeletal Exam: C-spine was in good range of motion.  Shoulder joints, elbow joints, wrist joints, MCPs PIPs and DIPs with good range of motion with no synovitis.  She had bilateral CMC, PIP and DIP  thickening.  Hip joints with good range of motion.  Knee joints with good range of motion without any warmth swelling or effusion.  She has thickening of bilateral ankle joints.  There was no tenderness over ankle joints or MTPs.  CDAI Exam: CDAI Score: 0.6  Patient Global: 3 mm; Provider Global: 3 mm Swollen: 0 ; Tender: 0  Joint Exam 07/19/2021   No joint exam has been documented for this visit   There is currently no information documented on the homunculus. Go to the Rheumatology activity and complete the homunculus joint exam.  Investigation: No additional findings.  Imaging: No results found.  Recent Labs: Lab Results  Component Value Date   WBC 6.5 04/26/2021   HGB 13.7 04/26/2021   PLT 207 04/26/2021   NA  140 04/26/2021   K 4.3 04/26/2021   CL 101 04/26/2021   CO2 33 (H) 04/26/2021   GLUCOSE 85 04/26/2021   BUN 19 04/26/2021   CREATININE 0.92 04/26/2021   BILITOT 0.6 04/26/2021   ALKPHOS 33 06/20/2017   AST 22 04/26/2021   ALT 21 04/26/2021   PROT 5.9 (L) 04/26/2021   ALBUMIN 4.1 06/20/2017   CALCIUM 9.7 04/26/2021   GFRAA 72 04/26/2021    Speciality Comments: PLQ eye exam:06/16/2021. Dr. Ander Purpura. Follow up in 1 year.   Prior therapy includes: Harriette Ohara (GI perforation), Humira (inadequate response), Enbrel (inadequate response), and Orencia (nausea and headaches)  Procedures:  No procedures performed Allergies: Vicodin [hydrocodone-acetaminophen] and Aspirin   Assessment / Plan:     Visit Diagnoses: Rheumatoid arthritis involving multiple sites with positive rheumatoid factor (HCC)-  She continues to have some pain and stiffness in her joints and intermittent swelling.  She is unable to decrease the dose of prednisone.  She is taking anywhere from 7.5 to 10 mg p.o. daily.  She had synovial thickening over ankle joints.  High risk medication use - Methotrexate 0.6 ml sq injection once weekly, folic acid 1 mg p.o. daily, Plaquenil 200 mg 1 tablet by mouth  BID M-F, prednisone 5- 10 mg p.o. daily.  Her last eye examination was on June 16, 2021.- Plan: CBC with Differential/Platelet, COMPLETE METABOLIC PANEL WITH GFR today and then every 3 months to monitor for drug toxicity.  Her last labs from Apr 26, 2021 were normal.  Instructions regarding all the vaccination were placed in the AVS.  She has been advised to stop methotrexate in case she develops an infection and resume once the infection resolves.  Current chronic use of systemic steroids - She is currently on prednisone 7.5-10 mg daily.  Side effects of prednisone use were discussed at length.  Primary osteoarthritis of both hands-she has bilateral PIP and DIP thickening.  Joint protection was discussed.  DDD (degenerative disc disease), cervical-she had good range of motion.  DDD (degenerative disc disease), lumbar-she had limited range of motion and she has chronic discomfort.  Vitamin D deficiency -she has history of vitamin D deficiency.  She has been taking vitamin D 5000 units daily.  Plan: VITAMIN D 25 Hydroxy (Vit-D Deficiency, Fractures)  Fibromyalgia - tramadol 50 mg 2 tablets 4 times daily and Percocet as needed for pain relief.    Other fatigue-due to fibromyalgia.  History of depression-she is on Zoloft and Xanax.  Vertigo - She has ongoing vertigo since receiving the second pfizer covid-19 vaccine on 04/07/20.  She has ongoing vertigo.  She was also found to have low blood pressure today.  She has an appointment with her cardiologist.  Status post colostomy [facility])  History of hyperlipidemia-increased risk of heart disease for the rheumatoid arthritis was discussed.  Need for regular exercise and dietary modifications were discussed and instructions were placed in the AVS.  Lapband APS April 2010  History of coronary artery disease  Essential hypertension, benign  Screening for osteoporosis -I do not have previous DEXA reports.  She is not sure where she had previous DEXA  scan.  We will schedule DEXA scan.  She will need treatment for prevention of osteoporosis as she is on long-term prednisone.  Use of calcium rich diet and vitamin D was emphasized.  Plan: DG Bone Density  Postmenopausal - Plan: DG Bone Density  Orders: Orders Placed This Encounter  Procedures   DG Bone Density   CBC  with Differential/Platelet   COMPLETE METABOLIC PANEL WITH GFR   VITAMIN D 25 Hydroxy (Vit-D Deficiency, Fractures)   No orders of the defined types were placed in this encounter.   Follow-Up Instructions: Return in about 3 months (around 10/19/2021) for Rheumatoid arthritis.   Pollyann Savoy, MD  Note - This record has been created using Animal nutritionist.  Chart creation errors have been sought, but may not always  have been located. Such creation errors do not reflect on  the standard of medical care.

## 2021-07-19 ENCOUNTER — Ambulatory Visit (INDEPENDENT_AMBULATORY_CARE_PROVIDER_SITE_OTHER): Payer: BC Managed Care – PPO | Admitting: Rheumatology

## 2021-07-19 ENCOUNTER — Encounter: Payer: Self-pay | Admitting: Rheumatology

## 2021-07-19 ENCOUNTER — Other Ambulatory Visit: Payer: Self-pay

## 2021-07-19 VITALS — BP 92/57 | HR 71 | Ht 63.0 in | Wt 151.8 lb

## 2021-07-19 DIAGNOSIS — Z780 Asymptomatic menopausal state: Secondary | ICD-10-CM

## 2021-07-19 DIAGNOSIS — Z933 Colostomy status: Secondary | ICD-10-CM

## 2021-07-19 DIAGNOSIS — M797 Fibromyalgia: Secondary | ICD-10-CM

## 2021-07-19 DIAGNOSIS — M0579 Rheumatoid arthritis with rheumatoid factor of multiple sites without organ or systems involvement: Secondary | ICD-10-CM | POA: Diagnosis not present

## 2021-07-19 DIAGNOSIS — Z79899 Other long term (current) drug therapy: Secondary | ICD-10-CM

## 2021-07-19 DIAGNOSIS — Z8659 Personal history of other mental and behavioral disorders: Secondary | ICD-10-CM

## 2021-07-19 DIAGNOSIS — M19041 Primary osteoarthritis, right hand: Secondary | ICD-10-CM

## 2021-07-19 DIAGNOSIS — M5136 Other intervertebral disc degeneration, lumbar region: Secondary | ICD-10-CM

## 2021-07-19 DIAGNOSIS — M5030 Other cervical disc degeneration, unspecified cervical region: Secondary | ICD-10-CM

## 2021-07-19 DIAGNOSIS — E559 Vitamin D deficiency, unspecified: Secondary | ICD-10-CM

## 2021-07-19 DIAGNOSIS — Z8679 Personal history of other diseases of the circulatory system: Secondary | ICD-10-CM

## 2021-07-19 DIAGNOSIS — M19042 Primary osteoarthritis, left hand: Secondary | ICD-10-CM

## 2021-07-19 DIAGNOSIS — Z7952 Long term (current) use of systemic steroids: Secondary | ICD-10-CM | POA: Diagnosis not present

## 2021-07-19 DIAGNOSIS — Z9884 Bariatric surgery status: Secondary | ICD-10-CM

## 2021-07-19 DIAGNOSIS — I10 Essential (primary) hypertension: Secondary | ICD-10-CM

## 2021-07-19 DIAGNOSIS — R5383 Other fatigue: Secondary | ICD-10-CM

## 2021-07-19 DIAGNOSIS — Z13820 Encounter for screening for osteoporosis: Secondary | ICD-10-CM

## 2021-07-19 DIAGNOSIS — R42 Dizziness and giddiness: Secondary | ICD-10-CM

## 2021-07-19 DIAGNOSIS — Z8639 Personal history of other endocrine, nutritional and metabolic disease: Secondary | ICD-10-CM

## 2021-07-19 NOTE — Patient Instructions (Addendum)
Standing Labs We placed an order today for your standing lab work.   Please have your standing labs drawn in November and every 3 months  If possible, please have your labs drawn 2 weeks prior to your appointment so that the provider can discuss your results at your appointment.  Please note that you may see your imaging and lab results in MyChart before we have reviewed them. We may be awaiting multiple results to interpret others before contacting you. Please allow our office up to 72 hours to thoroughly review all of the results before contacting the office for clarification of your results.  We have open lab daily: Monday through Thursday from 1:30-4:30 PM and Friday from 1:30-4:00 PM at the office of Dr. Zeyad Delaguila, [facility].   Please be advised, all patients with office appointments requiring lab work will take precedent over walk-in lab work.  If possible, please come for your lab work on Monday and Friday afternoons, as you may experience shorter wait times. The office is located at [address], [address], [place], [place] [address] No appointment is necessary.   Labs are drawn by [facility]. Please bring your co-pay at the time of your lab draw.  You may receive a bill from [facility] for your lab work.  If you wish to have your labs drawn at another location, please call the office 24 hours in advance to send orders.  If you have any questions regarding directions or hours of operation,  please call [phone].   As a reminder, please drink plenty of water prior to coming for your lab work. Thanks!   Vaccines You are taking a medication(s) that can suppress your immune system.  The following immunizations are recommended: Flu annually Covid-19  Td/Tdap (tetanus, diphtheria, pertussis) every 10 years Pneumonia (Prevnar 15 then Pneumovax 23 at least 1 year apart.  Alternatively, can take Prevnar 20 without needing additional dose) Shingrix (after age 50): 2  doses from 4 weeks to 6 months apart  Please check with your PCP to make sure you are up to date.   COVID-19 vaccine recommendations:   COVID-19 vaccine is recommended for everyone (unless you are allergic to a vaccine component), even if you are on a medication that suppresses your immune system.   If you are on Methotrexate, Cellcept (mycophenolate), Rinvoq, Xeljanz, and Olumiant- hold the medication for 1 week after each vaccine. Hold Methotrexate for 2 weeks after the single dose COVID-19 vaccine.   If you are on Orencia subcutaneous injection - hold medication one week prior to and one week after the first COVID-19 vaccine dose (only).   If you are on Orencia IV infusions- time vaccination administration so that the first COVID-19 vaccination will occur four weeks after the infusion and postpone the subsequent infusion by one week.   If you are on Cyclophosphamide or Rituxan infusions please contact your doctor prior to receiving the COVID-19 vaccine.   Do not take Tylenol or any anti-inflammatory medications (NSAIDs) 24 hours prior to the COVID-19 vaccination.   There is no direct evidence about the efficacy of the COVID-19 vaccine in individuals who are on medications that suppress the immune system.   Even if you are fully vaccinated, and you are on any medications that suppress your immune system, please continue to wear a mask, maintain at least six feet social distance and practice hand hygiene.   If you develop a COVID-19 infection, please contact your PCP or our office to determine if   you need monoclonal antibody infusion.  The booster vaccine is now available for immunocompromised patients.   Please see the following web sites for updated information.   [email or web address]-Patient-[email or web address]  If you test POSITIVE for COVID19 and have MILD to MODERATE symptoms: First, call your PCP if you would like to receive COVID19  treatment AND Hold your medications during the infection and for at least 1 week after your symptoms have resolved: Injectable medication (Benlysta, Cimzia, Cosentyx, Enbrel, Humira, Orencia, Remicade, Simponi, Stelara, Taltz, Tremfya) Methotrexate Leflunomide (Arava) Azathioprine Mycophenolate (Cellcept) Xeljanz, Olumiant, or Rinvoq Otezla If you take Actemra or Kevzara, you DO NOT need to hold these for COVID19 infection.  If you test POSITIVE for COVID19 and have NO symptoms: First, call your PCP if you would like to receive COVID19 treatment AND Hold your medications for at least 10 days after the day that you tested positive Injectable medication (Benlysta, Cimzia, Cosentyx, Enbrel, Humira, Orencia, Remicade, Simponi, Stelara, Taltz, Tremfya) Methotrexate Leflunomide (Arava) Azathioprine Mycophenolate (Cellcept) Xeljanz, Olumiant, or Rinvoq Otezla If you take Actemra or Kevzara, you DO NOT need to hold these for COVID19 infection.  If you have signs or symptoms of an infection or start antibiotics: First, call your PCP for workup of your infection. Hold your medication through the infection, until you complete your antibiotics, and until symptoms resolve if you take the following: Injectable medication (Actemra, Benlysta, Cimzia, Cosentyx, Enbrel, Humira, Kevzara, Orencia, Remicade, Simponi, Stelara, Taltz, Tremfya) Methotrexate Leflunomide (Arava) Mycophenolate (Cellcept) Xeljanz, Olumiant, or Rinvoq   Heart Disease Prevention   Your inflammatory disease increases your risk of heart disease which includes heart attack, stroke, atrial fibrillation (irregular heartbeats), high blood pressure, heart failure and atherosclerosis (plaque in the arteries).  It is important to reduce your risk by:   Keep blood pressure, cholesterol, and blood sugar at healthy levels   Smoking Cessation   Maintain a healthy weight  BMI 20-25   Eat a healthy diet  Plenty of fresh fruit,  vegetables, and whole grains  Limit saturated fats, foods high in sodium, and added sugars  DASH and Mediterranean diet   Increase physical activity  Recommend moderate physically activity for 150 minutes per week/ 30 minutes a day for five days a week These can be broken up into three separate ten-minute sessions during the day.   Reduce Stress  Meditation, slow breathing exercises, yoga, coloring books  Dental visits twice a year    

## 2021-07-20 ENCOUNTER — Other Ambulatory Visit: Payer: Self-pay | Admitting: *Deleted

## 2021-07-20 DIAGNOSIS — Z79899 Other long term (current) drug therapy: Secondary | ICD-10-CM

## 2021-07-20 LAB — CBC WITH DIFFERENTIAL/PLATELET
Absolute Monocytes: 374 cells/uL (ref 200–950)
Basophils Absolute: 71 cells/uL (ref 0–200)
Basophils Relative: 0.4 %
Eosinophils Absolute: 214 cells/uL (ref 15–500)
Eosinophils Relative: 1.2 %
HCT: 38.4 % (ref 35.0–45.0)
Hemoglobin: 13.1 g/dL (ref 11.7–15.5)
Lymphs Abs: 160 cells/uL — ABNORMAL LOW (ref 850–3900)
MCH: 35.3 pg — ABNORMAL HIGH (ref 27.0–33.0)
MCHC: 34.1 g/dL (ref 32.0–36.0)
MCV: 103.5 fL — ABNORMAL HIGH (ref 80.0–100.0)
MPV: 11.4 fL (ref 7.5–12.5)
Monocytes Relative: 2.1 %
Neutro Abs: 16981 cells/uL — ABNORMAL HIGH (ref 1500–7800)
Neutrophils Relative %: 95.4 %
Platelets: 193 10*3/uL (ref 140–400)
RBC: 3.71 10*6/uL — ABNORMAL LOW (ref 3.80–5.10)
RDW: 14.5 % (ref 11.0–15.0)
Total Lymphocyte: 0.9 %
WBC: 17.8 10*3/uL — ABNORMAL HIGH (ref 3.8–10.8)

## 2021-07-20 LAB — COMPLETE METABOLIC PANEL WITH GFR
AG Ratio: 1.8 (calc) (ref 1.0–2.5)
ALT: 18 U/L (ref 6–29)
AST: 21 U/L (ref 10–35)
Albumin: 3.8 g/dL (ref 3.6–5.1)
Alkaline phosphatase (APISO): 50 U/L (ref 37–153)
BUN/Creatinine Ratio: 15 (calc) (ref 6–22)
BUN: 23 mg/dL (ref 7–25)
CO2: 30 mmol/L (ref 20–32)
Calcium: 9.2 mg/dL (ref 8.6–10.4)
Chloride: 99 mmol/L (ref 98–110)
Creat: 1.5 mg/dL — ABNORMAL HIGH (ref 0.60–1.00)
Globulin: 2.1 g/dL (calc) (ref 1.9–3.7)
Glucose, Bld: 104 mg/dL — ABNORMAL HIGH (ref 65–99)
Potassium: 4.7 mmol/L (ref 3.5–5.3)
Sodium: 136 mmol/L (ref 135–146)
Total Bilirubin: 0.6 mg/dL (ref 0.2–1.2)
Total Protein: 5.9 g/dL — ABNORMAL LOW (ref 6.1–8.1)
eGFR: 37 mL/min/{1.73_m2} — ABNORMAL LOW (ref >=60)

## 2021-07-20 LAB — VITAMIN D 25 HYDROXY (VIT D DEFICIENCY, FRACTURES): Vit D, 25-Hydroxy: 74 ng/mL (ref 30–100)

## 2021-07-20 MED ORDER — METHOTREXATE SODIUM CHEMO INJECTION 50 MG/2ML: 7.5000 mg | INTRAMUSCULAR | 0 refills | Status: DC

## 2021-07-20 NOTE — Progress Notes (Signed)
White cell count is elevated due to prednisone use.  Creatinine is elevated.  Please advise patient to decrease methotrexate to 0.3 mL subcu weekly.  She should avoid all NSAIDs.  Repeat BMP with GFR and 3 weeks.  We may switch to leflunomide if creatinine stays elevated.

## 2021-07-20 NOTE — Telephone Encounter (Signed)
-----   Message from Pollyann Savoy, MD sent at 07/20/2021  1:09 PM EDT ----- White cell count is elevated due to prednisone use.  Creatinine is elevated.  Please advise patient to decrease methotrexate to 0.3 mL subcu weekly.  She should avoid all NSAIDs.  Repeat BMP with GFR and 3 weeks.  We may switch to leflunomide if crea tinine stays elevated.

## 2021-07-25 ENCOUNTER — Encounter (HOSPITAL_COMMUNITY): Payer: Self-pay | Admitting: Emergency Medicine

## 2021-07-25 ENCOUNTER — Other Ambulatory Visit: Payer: Self-pay

## 2021-07-25 ENCOUNTER — Observation Stay (HOSPITAL_COMMUNITY)
Admission: EM | Admit: 2021-07-25 | Discharge: 2021-07-25 | Disposition: A | Discharge status: Home / Self Care | Payer: BC Managed Care – PPO | Attending: Internal Medicine | Admitting: Internal Medicine

## 2021-07-25 ENCOUNTER — Emergency Department (HOSPITAL_COMMUNITY): Payer: BC Managed Care – PPO

## 2021-07-25 DIAGNOSIS — I2510 Atherosclerotic heart disease of native coronary artery without angina pectoris: Secondary | ICD-10-CM | POA: Insufficient documentation

## 2021-07-25 DIAGNOSIS — Z20822 Contact with and (suspected) exposure to covid-19: Secondary | ICD-10-CM | POA: Insufficient documentation

## 2021-07-25 DIAGNOSIS — E860 Dehydration: Principal | ICD-10-CM | POA: Insufficient documentation

## 2021-07-25 DIAGNOSIS — Z87891 Personal history of nicotine dependence: Secondary | ICD-10-CM | POA: Diagnosis not present

## 2021-07-25 DIAGNOSIS — D539 Nutritional anemia, unspecified: Secondary | ICD-10-CM | POA: Insufficient documentation

## 2021-07-25 DIAGNOSIS — R7401 Elevation of levels of liver transaminase levels: Secondary | ICD-10-CM | POA: Insufficient documentation

## 2021-07-25 DIAGNOSIS — Z955 Presence of coronary angioplasty implant and graft: Secondary | ICD-10-CM | POA: Diagnosis not present

## 2021-07-25 DIAGNOSIS — E871 Hypo-osmolality and hyponatremia: Secondary | ICD-10-CM | POA: Insufficient documentation

## 2021-07-25 DIAGNOSIS — E876 Hypokalemia: Secondary | ICD-10-CM | POA: Diagnosis present

## 2021-07-25 DIAGNOSIS — R627 Adult failure to thrive: Secondary | ICD-10-CM | POA: Insufficient documentation

## 2021-07-25 DIAGNOSIS — I10 Essential (primary) hypertension: Secondary | ICD-10-CM | POA: Diagnosis not present

## 2021-07-25 DIAGNOSIS — Z79899 Other long term (current) drug therapy: Secondary | ICD-10-CM | POA: Diagnosis not present

## 2021-07-25 DIAGNOSIS — R748 Abnormal levels of other serum enzymes: Secondary | ICD-10-CM

## 2021-07-25 DIAGNOSIS — R531 Weakness: Secondary | ICD-10-CM | POA: Diagnosis present

## 2021-07-25 DIAGNOSIS — Z7982 Long term (current) use of aspirin: Secondary | ICD-10-CM | POA: Diagnosis not present

## 2021-07-25 LAB — COMPREHENSIVE METABOLIC PANEL
ALT: 91 U/L — ABNORMAL HIGH (ref 0–44)
AST: 125 U/L — ABNORMAL HIGH (ref 15–41)
Albumin: 2.1 g/dL — ABNORMAL LOW (ref 3.5–5.0)
Alkaline Phosphatase: 123 U/L (ref 38–126)
Anion gap: 9 (ref 5–15)
BUN: 50 mg/dL — ABNORMAL HIGH (ref 8–23)
CO2: 24 mmol/L (ref 22–32)
Calcium: 8.4 mg/dL — ABNORMAL LOW (ref 8.9–10.3)
Chloride: 101 mmol/L (ref 98–111)
Creatinine, Ser: 1.24 mg/dL — ABNORMAL HIGH (ref 0.44–1.00)
GFR, Estimated: 46 mL/min — ABNORMAL LOW (ref >60)
Glucose, Bld: 118 mg/dL — ABNORMAL HIGH (ref 70–99)
Potassium: 2.8 mmol/L — ABNORMAL LOW (ref 3.5–5.1)
Sodium: 134 mmol/L — ABNORMAL LOW (ref 135–145)
Total Bilirubin: 0.7 mg/dL (ref 0.3–1.2)
Total Protein: 5.1 g/dL — ABNORMAL LOW (ref 6.5–8.1)

## 2021-07-25 LAB — CBC
HCT: 32.2 % — ABNORMAL LOW (ref 36.0–46.0)
Hemoglobin: 11.2 g/dL — ABNORMAL LOW (ref 12.0–15.0)
MCH: 35.1 pg — ABNORMAL HIGH (ref 26.0–34.0)
MCHC: 34.8 g/dL (ref 30.0–36.0)
MCV: 100.9 fL — ABNORMAL HIGH (ref 80.0–100.0)
Platelets: 138 10*3/uL — ABNORMAL LOW (ref 150–400)
RBC: 3.19 MIL/uL — ABNORMAL LOW (ref 3.87–5.11)
RDW: 15.7 % — ABNORMAL HIGH (ref 11.5–15.5)
WBC: 11.3 10*3/uL — ABNORMAL HIGH (ref 4.0–10.5)
nRBC: 0 % (ref 0.0–0.2)

## 2021-07-25 LAB — RESP PANEL BY RT-PCR (FLU A&B, COVID) ARPGX2
Influenza A by PCR: NEGATIVE
Influenza B by PCR: NEGATIVE
SARS Coronavirus 2 by RT PCR: NEGATIVE

## 2021-07-25 LAB — TROPONIN I (HIGH SENSITIVITY): Troponin I (High Sensitivity): 12 ng/L (ref <18)

## 2021-07-25 MED ORDER — POTASSIUM CHLORIDE CRYS ER 20 MEQ PO TBCR: 40.0000 meq | EXTENDED_RELEASE_TABLET | Freq: Every day | ORAL | 0 refills | Status: DC

## 2021-07-25 MED ORDER — POTASSIUM CHLORIDE CRYS ER 20 MEQ PO TBCR
40.0000 meq | EXTENDED_RELEASE_TABLET | Freq: Once | ORAL | Status: AC
  Administered 2021-07-25: 40 meq via ORAL
  Filled 2021-07-25: qty 2

## 2021-07-25 MED ORDER — POTASSIUM CHLORIDE 10 MEQ/100ML IV SOLN
10.0000 meq | INTRAVENOUS | Status: DC
Start: 2021-07-25 — End: 2021-07-25
  Administered 2021-07-25 (×2): 10 meq via INTRAVENOUS
  Filled 2021-07-25 (×2): qty 100

## 2021-07-25 MED ORDER — POTASSIUM CHLORIDE 10 MEQ/100ML IV SOLN: 10.0000 meq | INTRAVENOUS | Status: DC

## 2021-07-25 MED ORDER — SODIUM CHLORIDE 0.9 % IV BOLUS
1000.0000 mL | Freq: Once | INTRAVENOUS | Status: AC
  Administered 2021-07-25: 1000 mL via INTRAVENOUS

## 2021-07-25 NOTE — ED Triage Notes (Signed)
BIBA Per EMS:  Pt coming from home with complaints of increased weakness x7 days and increase in urination. All vitals stable. 98HR; 100/60 BP; 22HR; 116 CBG 300cc fluid given en route  Pt A&Ox3

## 2021-07-25 NOTE — ED Notes (Signed)
Patient requesting to go home immediately. She states that she does not want to be admitted. Paged hospitalist who is now at bedside speaking with patient.

## 2021-07-25 NOTE — ED Provider Notes (Addendum)
[facility]-EMERGENCY DEPT Provider Note   CSN: [id number] Arrival date & time: 07/25/21  1011     History Chief Complaint  Patient presents with   Weakness    Vanessa Collins is a 75 y.o. female.  Vanessa Collins presents stating that she just does not feel well.  She has had difficulty getting up out of a chair with the bed.  She has had multiple falls secondary to weakness, and at one point, she was lying in her yard waiting on her husband to hear her.  She does not typically ambulate with assistance.  She has had some minimal chest pain at times, but really she lacks other localizing symptoms.  The history is provided by the patient.  Weakness Severity:  Severe Onset quality:  Gradual Duration:  1 week Timing:  Constant Progression:  Unchanged Chronicity:  New Context: not change in medication, not increased activity and not recent infection   Relieved by:  Nothing Exacerbated by: trying to stand or walk. Associated symptoms: anorexia, chest pain, difficulty walking, falls and lethargy   Associated symptoms: no abdominal pain, no aphasia, no arthralgias, no cough, no diarrhea, no dysuria, no fever, no frequency, no headaches, no seizures, no sensory-motor deficit, no shortness of breath, no stroke symptoms and no vomiting   Risk factors comment:  No sick contacts     Past Medical History:  Diagnosis Date   Arthritis    rheumatoid , osteo    CAD (coronary artery disease)    PCI to LAD 2010   Diverticulitis    with perforation and colostomy placement     Fibromyalgia    Heart disease    Heart murmur    Hyperlipidemia    Hypertension    Myocardial infarction [facility]) 2008   one Stent   Shingles 03/2020   per patient    Vertigo    per patient    Vitamin D deficiency    Wears dentures    gum disease    Patient Active Problem List   Diagnosis Date Noted   S/P repair of ventral hernia 07/11/2019   Hypertension    Hyperlipidemia    Diverticulitis  of rectosigmoid s/p robotic LAR/colostomy takedown 10/09/2018 10/09/2018   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 10/09/2018 10/09/2018   History of depression 06/19/2017   Rheumatoid arthritis involving multiple sites with positive rheumatoid factor (HCC) 01/25/2017   High risk medication use 01/25/2017   Immunosuppression (HCC) 01/25/2017   Fibromyalgia 01/25/2017   Primary osteoarthritis of both hands 01/25/2017   Spondylosis of lumbar region without myelopathy or radiculopathy 01/25/2017   DJD (degenerative joint disease), cervical 01/25/2017   History of neutropenia 01/25/2017   History of coronary artery disease 01/25/2017   Lapband APS April 2010 02/25/2013   Wears dentures     Past Surgical History:  Procedure Laterality Date   CESAREAN SECTION     COLECTOMY WITH COLOSTOMY CREATION/HARTMANN PROCEDURE  09/2017   Perforated diverticulitis   CORONARY STENT PLACEMENT  08/2009   EYE SURGERY     LAPAROSCOPIC GASTRIC BANDING  04/05/2009   Dr Daphine Deutscher   PROCTOSCOPY N/A 10/09/2018   Procedure: RIDGID PROCTOSCOPY;  Surgeon: Karie Soda, MD;  Location: [facility]S;  Service: General;  Laterality: N/A;   VAGINAL HYSTERECTOMY     VENTRAL HERNIA REPAIR N/A 07/11/2019   Procedure: LAPAROSCOPIC ASSISTED VENTRAL HERNIA REPAIR;  Surgeon: Luretha Murphy, MD;  Location: [facility];  Service: General;  Laterality: N/A;  OB History   No obstetric history on file.     Family History  Problem Relation Age of Onset   Macular degeneration Mother    Cancer Father    Rheum arthritis Father    Bipolar disorder Father    Bipolar disorder Daughter    Rheum arthritis Daughter     Social History   Tobacco Use   Smoking status: Former    Packs/day: 0.25    Years: 25.00    Pack years: 6.25    Types: Cigarettes   Smokeless tobacco: Never  Vaping Use   Vaping Use: Never used  Substance Use Topics   Alcohol use: No   Drug use: No    Home Medications Prior to Admission  medications   Medication Sig Start Date End Date Taking? Authorizing Provider  ALPRAZolam Prudy Feeler) 0.5 MG tablet Take 1 mg by mouth at bedtime.    [provider]  aspirin 81 MG tablet Take 81 mg by mouth daily.    [provider]  cetirizine (ZYRTEC) 10 MG tablet Take 10 mg by mouth at bedtime.     [provider]  Cholecalciferol (VITAMIN D3) 5000 units CAPS Take 5,000 Units by mouth daily.     [provider]  estropipate (OGEN) 0.75 MG tablet Take 0.75 mg by mouth daily. 12/23/16   [provider]  famotidine (PEPCID) 20 MG tablet Take 20 mg by mouth 2 (two) times daily.    [provider]  folic acid (FOLVITE) 1 MG tablet TAKE 2 TABLETS BY MOUTH DAILY 05/04/21   Gearldine Bienenstock, PA-C  hydroxychloroquine (PLAQUENIL) 200 MG tablet Take 1 tablet by mouth twice daily Monday through Friday only. 04/26/21   Gearldine Bienenstock, PA-C  levothyroxine (SYNTHROID) 25 MCG tablet Take 25 mcg by mouth daily before breakfast. 05/27/19   [provider]  methotrexate 50 MG/2ML injection Inject 0.3 mLs (7.5 mg total) into the skin once a week. 07/20/21   Gearldine Bienenstock, PA-C  metoprolol succinate (TOPROL-XL) 25 MG 24 hr tablet Take 25 mg by mouth every evening. Take with or immediately following a meal.    [provider]  Multiple Vitamin (MULTIVITAMIN WITH MINERALS) TABS tablet Take 1 tablet by mouth daily.    [provider]  nitroGLYCERIN (NITROSTAT) 0.4 MG SL tablet Place 0.4 mg under the tongue every 5 (five) minutes as needed for chest pain.     [provider]  PERCOCET 5-325 MG tablet Take 0.5-1 tablets by mouth daily as needed. 07/26/20   [provider]  predniSONE (DELTASONE) 5 MG tablet TAKE 2 TABLETS BY MOUTH EVERY DAY WITH BREAKFAST 05/04/21   Gearldine Bienenstock, PA-C  PREMARIN 0.3 MG tablet Take 0.3 mg by mouth daily. 09/10/18   [provider]  sertraline (ZOLOFT) 50 MG tablet Take 50 mg by mouth daily.      [provider]  simvastatin (ZOCOR) 80 MG tablet Take 80 mg by mouth every evening.     [provider]  traMADol (ULTRAM) 50 MG tablet Take 2 tablets (100 mg total) by mouth 4 (four) times daily. 07/12/19   Romie Levee, MD  TUBERCULIN SYR 1CC/27GX1/2" (B-D TB SYRINGE 1CC/27GX1/2") 27G X 1/2" 1 ML MISC Use to inject methotrexate once weekly. 04/29/21   Gearldine Bienenstock, PA-C    Allergies    Vicodin [hydrocodone-acetaminophen] and Aspirin  Review of Systems   Review of Systems  Constitutional:  Negative for chills and fever.  HENT:  Negative for ear pain and sore throat.   Eyes:  Negative for pain and visual disturbance.  Respiratory:  Negative for cough and shortness of breath.   Cardiovascular:  Positive for chest pain. Negative for palpitations.  Gastrointestinal:  Positive for anorexia. Negative for abdominal pain, diarrhea and vomiting.  Genitourinary:  Negative for dysuria, frequency and hematuria.  Musculoskeletal:  Positive for falls. Negative for arthralgias and back pain.  Skin:  Negative for color change and rash.  Neurological:  Positive for weakness. Negative for seizures, syncope and headaches.  All other systems reviewed and are negative.  Physical Exam Updated Vital Signs BP 103/60   Pulse 73   Temp 98.4 F (36.9 C) (Oral)   Resp (!) 25   SpO2 95%   Physical Exam Constitutional:      Appearance: Normal appearance.  HENT:     Head: Normocephalic and atraumatic.     Nose: Nose normal.     Mouth/Throat:     Mouth: Mucous membranes are moist.  Eyes:     Extraocular Movements: Extraocular movements intact.     Conjunctiva/sclera: Conjunctivae normal.     Pupils: Pupils are equal, round, and reactive to light.  Cardiovascular:     Rate and Rhythm: Normal rate and regular rhythm.     Heart sounds: No murmur heard. Pulmonary:     Effort: Pulmonary effort is normal. No respiratory distress.     Breath sounds: No wheezing.  Abdominal:      General: There is no distension.     Tenderness: There is no abdominal tenderness. There is no guarding.  Musculoskeletal:     Cervical back: Normal range of motion.     Comments: No joint swelling.  No deformities.  She has some minimal bruising over her upper extremities and a small bruise over her left scapula  Skin:    General: Skin is warm and dry.     Findings: Bruising present.  Neurological:     General: No focal deficit present.     Mental Status: She is alert and oriented to person, place, and time.     Sensory: No sensory deficit.     Motor: No weakness.  Psychiatric:        Mood and Affect: Mood normal.        Behavior: Behavior normal.    ED Results / Procedures / Treatments   Labs (all labs ordered are listed, but only abnormal results are displayed) Labs Reviewed  CBC - Abnormal; Notable for the following components:      Result Value   WBC 11.3 (*)    RBC 3.19 (*)    Hemoglobin 11.2 (*)    HCT 32.2 (*)    MCV 100.9 (*)    MCH 35.1 (*)    RDW 15.7 (*)    Platelets 138 (*)    All other components within normal limits  COMPREHENSIVE METABOLIC PANEL - Abnormal; Notable for the following components:   Sodium 134 (*)    Potassium 2.8 (*)    Glucose, Bld 118 (*)    BUN 50 (*)    Creatinine, Ser 1.24 (*)    Calcium 8.4 (*)    Total Protein 5.1 (*)    Albumin 2.1 (*)    AST 125 (*)    ALT 91 (*)    GFR, Estimated 46 (*)    All other components within normal limits  RESP PANEL BY RT-PCR (FLU A&B, COVID) ARPGX2  URINALYSIS, ROUTINE W REFLEX  MICROSCOPIC  TROPONIN I (HIGH SENSITIVITY)  TROPONIN I (HIGH SENSITIVITY)    EKG EKG Interpretation  Date/Time:  Monday July 25 2021 10:21:45 EDT Ventricular Rate:  83 PR Interval:  166 QRS Duration: 104 QT Interval:  382 QTC Calculation: 448 R Axis:   -23 Text Interpretation: Normal sinus rhythm Moderate voltage criteria for LVH, may be normal variant ( R in aVL , Cornell product ) Anterolateral infarct , age  undetermined Abnormal ECG since last tracing no significant change Confirmed by Mancel Bale [phone]) on 07/25/2021 10:40:59 AM  Radiology CT Head Wo Contrast  Result Date: 07/25/2021 CLINICAL DATA:  Head trauma. Complains of confusion and weakness for 5 days. EXAM: CT HEAD WITHOUT CONTRAST TECHNIQUE: Contiguous axial images were obtained from the base of the skull through the vertex without intravenous contrast. COMPARISON:  None. FINDINGS: Brain: No evidence of acute infarction, hemorrhage, hydrocephalus, extra-axial collection or mass lesion/mass effect. There is prominence of the CSF spaces overlying the cerebral hemispheres compatible with brain atrophy. Vascular: No hyperdense vessel or unexpected calcification. Skull: Normal. Negative for fracture or focal lesion. Sinuses/Orbits: No acute finding. Other: None IMPRESSION: 1. No acute intracranial abnormalities. 2. Chronic small vessel ischemic change and brain atrophy. Electronically Signed   By: Signa Kell M.D.   On: 07/25/2021 12:19   DG Chest Port 1 View  Result Date: 07/25/2021 CLINICAL DATA:  Weakness. EXAM: PORTABLE CHEST 1 VIEW COMPARISON:  March 02, 2015. FINDINGS: Mild enlargement the cardiac silhouette. Coronary artery stent. Calcific atherosclerosis of the aorta. Both lungs are clear. No visible pleural effusions or pneumothorax. IMPRESSION: 1. No evidence of acute cardiopulmonary disease. 2. Mild cardiomegaly. Electronically Signed   By: Feliberto Harts MD   On: 07/25/2021 12:52    Procedures Procedures   Medications Ordered in ED Medications  potassium chloride 10 mEq in 100 mL IVPB (10 mEq Intravenous New Bag/Given 07/25/21 1239)  sodium chloride 0.9 % bolus 1,000 mL (1,000 mLs Intravenous New Bag/Given 07/25/21 1235)    ED Course  I have reviewed the triage vital signs and the nursing notes.  Pertinent labs & imaging results that were available during my care of the patient were reviewed by me and considered in my medical  decision making (see chart for details).  Clinical Course as of 07/25/21 1518  Mon Jul 25, 2021  1256 I spoke with Dr. Sharon Seller [AW]  1419 The patient has been seen by Dr. Sharon Seller. The patient is adamant that she will not stay in the hospital. She is feeling better. She will be able to tolerate oral potassium.  [AW]    Clinical Course User Index [AW] Koleen Distance, MD   MDM Rules/Calculators/A&P                           [name] presented with generalized malaise, frequent falls.  She was found to be profoundly dehydrated and hypokalemic.  She was given IV fluid and potassium replacement and will be admitted for further evaluation.  COVID-19 test is pending, but I am suspicious for this as the underlying pathology for her dehydration.  She has endorse some anorexia, and this could go along with COVID-19 or other viral illness.  She is also on methotrexate, and her mild elevation in liver enzymes could be a result of this medication. Final Clinical Impression(s) / ED Diagnoses Final diagnoses:  Dehydration  Hypokalemia  Elevated liver enzymes    Rx / DC Orders ED  Discharge Orders          Ordered    potassium chloride SA (KLOR-CON) 20 MEQ tablet  Daily        07/25/21 1518             Koleen Distance, MD 07/25/21 1257    Koleen Distance, MD 07/25/21 [phone]

## 2021-07-25 NOTE — H&P (Addendum)
ADMISSION HISTORY AND PHYSICAL   PERL FOLMAR AJG:[id number] DOB: 1946-02-16 DOA: 07/25/2021  PCP: Clovis Riley, L.August Saucer, MD Patient coming from: Home  Chief Complaint: "Not feeling well"  HPI:  75 year old with a history of rheumatoid arthritis on MTX plaquenil and prednisone, fibromyalgia, CAD status post PCI to LAD 2010, HLD, HTN, and diverticulosis who presented to the ED because "she just did not feel good."  She reported severe progressive weakness over a 2 to 3-day.  To the point she was not able to get up out of bed or even a chair without help.  She has had suffered multiple falls at home due to her weakness and instability on her feet and at one point was even found lying in her yard.  She denied nausea or vomiting.  There is no chest pain or abdominal pain.  She did admit to poor oral intake in general.  In the ER she was found to be significantly dehydrated with an elevated BUN to creatinine ratio and also markedly hypokalemic at 2.8.  At the time that I went into the room to evaluate the patient she told me she did not wish to be admitted and wanted to go home "immediately."  She stated that she was feeling much better after receiving IV fluid.  She stated she was willing to take oral potassium if needed.  We discussed her hypokalemia and the dangers associated with it and she felt confident she will be able to resume a normal diet at home as well as take K-Dur prior to her discharge.  She voiced understanding but was adamant that she be discharged home.  I discussed the patient's case with the attending ED physician.  I opined that I did not feel it was unreasonable for the patient to go home given that she was feeling better overall and committed to oral potassium replacement and resumption of her regular diet.  The ED physician agreed and was willing to complete the discharge paperwork herself.  I am therefore canceling her admission.  Disposition Plan:  Admit to observation status  canceled due to patient's insistence she go home  Assessment/Plan:  Hypokalemia Replacement given while in the ED -patient counseled on importance of resumption of regular diet and outpatient follow-up  Hyponatremia Likely simply hypovolemic in nature due to poor intake  Generalized failure to thrive -gait instability -generalized weakness Patient tells me she has assistance at home and will be able to fend just fine  Mild transaminitis Could be a combination of methotrexate and poor oral intake/dehydration  Macrocytic anemia Check anemia panel and next outpatient follow-up visit  Fibromyalgia Continue usual home medication regimen to include Ultram and Percocet as needed  Chronic vertigo Records indicate ongoing vertigo since receiving her second Pfizer COVID-vaccine April 2021     DVT prophylaxis: lovenox Code Status: FULL Family Communication: No family present Consults called: none indicated  Review of Systems: As per HPI otherwise 10 point review of systems negative.   Past Medical History:  Diagnosis Date   Arthritis    rheumatoid , osteo    CAD (coronary artery disease)    PCI to LAD 2010   Diverticulitis    with perforation and colostomy placement     Fibromyalgia    Heart disease    Heart murmur    Hyperlipidemia    Hypertension    Myocardial infarction [facility]) 2008   one Stent   Shingles 03/2020   per patient    Vertigo  per patient    Vitamin D deficiency    Wears dentures    gum disease    Past Surgical History:  Procedure Laterality Date   CESAREAN SECTION     COLECTOMY WITH COLOSTOMY CREATION/HARTMANN PROCEDURE  09/2017   Perforated diverticulitis   CORONARY STENT PLACEMENT  08/2009   EYE SURGERY     LAPAROSCOPIC GASTRIC BANDING  04/05/2009   Dr Daphine Deutscher   PROCTOSCOPY N/A 10/09/2018   Procedure: RIDGID PROCTOSCOPY;  Surgeon: Karie Soda, MD;  Location: [facility];  Service: General;  Laterality: N/A;   VAGINAL HYSTERECTOMY     VENTRAL  HERNIA REPAIR N/A 07/11/2019   Procedure: LAPAROSCOPIC ASSISTED VENTRAL HERNIA REPAIR;  Surgeon: Luretha Murphy, MD;  Location: [facility];  Service: General;  Laterality: N/A;    Family History  Family History  Problem Relation Age of Onset   Macular degeneration Mother    Cancer Father    Rheum arthritis Father    Bipolar disorder Father    Bipolar disorder Daughter    Rheum arthritis Daughter     Social History   reports that she has quit smoking. She has a 6.25 pack-year smoking history. She has never used smokeless tobacco. She reports that she does not drink alcohol and does not use drugs.  Allergies Allergies  Allergen Reactions   Vicodin [Hydrocodone-Acetaminophen]     Upset stomach   Aspirin Other (See Comments)    Stomach cramping    Prior to Admission medications   Medication Sig Start Date End Date Taking? Authorizing Provider  ALPRAZolam Prudy Feeler) 0.5 MG tablet Take 1 mg by mouth 3 (three) times daily.   Yes [provider]  aspirin 81 MG tablet Take 81 mg by mouth daily.   Yes [provider]  cetirizine (ZYRTEC) 10 MG tablet Take 10 mg by mouth at bedtime.    Yes [provider]  Cholecalciferol (VITAMIN D3) 5000 units CAPS Take 5,000 Units by mouth daily.    Yes [provider]  famotidine (PEPCID) 20 MG tablet Take 20 mg by mouth 2 (two) times daily.   Yes [provider]  folic acid (FOLVITE) 1 MG tablet TAKE 2 TABLETS BY MOUTH DAILY Patient taking differently: Take 2 mg by mouth daily. 05/04/21  Yes Gearldine Bienenstock, PA-C  hydroxychloroquine (PLAQUENIL) 200 MG tablet Take 1 tablet by mouth twice daily Monday through Friday only. Patient taking differently: Take 200 mg by mouth See admin instructions. Take 1 tablet by mouth twice daily Monday through Friday only. 04/26/21  Yes Gearldine Bienenstock, PA-C  levothyroxine (SYNTHROID) 25 MCG tablet Take 25 mcg by mouth daily before breakfast. 05/27/19  Yes [provider]   methotrexate 50 MG/2ML injection Inject 0.3 mLs (7.5 mg total) into the skin once a week. 07/20/21  Yes Gearldine Bienenstock, PA-C  Multiple Vitamin (MULTIVITAMIN WITH MINERALS) TABS tablet Take 1 tablet by mouth daily.   Yes [provider]  nitroGLYCERIN (NITROSTAT) 0.4 MG SL tablet Place 0.4 mg under the tongue every 5 (five) minutes as needed for chest pain.    Yes [provider]  PERCOCET 5-325 MG tablet Take 0.5-1 tablets by mouth daily as needed for severe pain. 07/26/20  Yes [provider]  predniSONE (DELTASONE) 5 MG tablet TAKE 2 TABLETS BY MOUTH EVERY DAY WITH BREAKFAST Patient taking differently: Take 10 mg by mouth daily with breakfast. 05/04/21  Yes Gearldine Bienenstock, PA-C  PREMARIN 0.3 MG tablet Take 0.3 mg by mouth daily.  09/10/18  Yes [provider]  sertraline (ZOLOFT) 50 MG tablet Take 50 mg by mouth daily.    Yes [provider]  simvastatin (ZOCOR) 80 MG tablet Take 80 mg by mouth every evening.    Yes [provider]  traMADol (ULTRAM) 50 MG tablet Take 2 tablets (100 mg total) by mouth 4 (four) times daily. Patient taking differently: Take 100 mg by mouth every 6 (six) hours as needed for moderate pain. 07/12/19  Yes Romie Levee, MD  estropipate (OGEN) 0.75 MG tablet Take 0.75 mg by mouth daily. 12/23/16   [provider]  metoprolol succinate (TOPROL-XL) 25 MG 24 hr tablet Take 25 mg by mouth every evening. Take with or immediately following a meal.    [provider]  TUBERCULIN SYR 1CC/27GX1/2" (B-D TB SYRINGE 1CC/27GX1/2") 27G X 1/2" 1 ML MISC Use to inject methotrexate once weekly. 04/29/21   Gearldine Bienenstock, PA-C    Physical Exam: Vitals:   07/25/21 1100 07/25/21 1130 07/25/21 1230 07/25/21 1300  BP: (!) 105/58 103/60 (!) 110/59 119/63  Pulse: 75 73 72 73  Resp: 18 (!) 25 (!) 22 (!) 21  Temp:      TempSrc:      SpO2: 95% 95% 98% 98%    Constitutional: NAD, calm, comfortable Respiratory: clear to  auscultation bilaterally, no wheezing, no crackles. Normal respiratory effort. No accessory muscle use.  Cardiovascular: Regular rate and rhythm, no murmurs / rubs / gallops. No extremity edema. 2+ pedal pulses.  Abdomen: No tenderness or masses to palpation. No hepatosplenomegaly. Bowel sounds positive. Not distended. Soft.  Musculoskeletal: No clubbing / cyanosis. No joint deformity upper and lower extremities. No contractures.  Neurologic: CN 2-12 grossly intact B. Sensation intact.     Labs on Admission:   CBC: Recent Labs  Lab 07/19/21 1540 07/25/21 1115  WBC 17.8* 11.3*  NEUTROABS 16,981*  --   HGB 13.1 11.2*  HCT 38.4 32.2*  MCV 103.5* 100.9*  PLT 193 138*   Basic Metabolic Panel: Recent Labs  Lab 07/19/21 1540 07/25/21 1115  NA 136 134*  K 4.7 2.8*  CL 99 101  CO2 30 24  GLUCOSE 104* 118*  BUN 23 50*  CREATININE 1.50* 1.24*  CALCIUM 9.2 8.4*   GFR: Estimated Creatinine Clearance: 37.6 mL/min (A) (by C-G formula based on SCr of 1.24 mg/dL (H)). Liver Function Tests: Recent Labs  Lab 07/19/21 1540 07/25/21 1115  AST 21 125*  ALT 18 91*  ALKPHOS  --  123  BILITOT 0.6 0.7  PROT 5.9* 5.1*  ALBUMIN  --  2.1*   Urine analysis:    Component Value Date/Time   COLORURINE YELLOW 09/13/2009 0316   APPEARANCEUR CLOUDY (A) 09/13/2009 0316   LABSPEC 1.009 09/13/2009 0316   PHURINE 6.0 09/13/2009 0316   GLUCOSEU NEGATIVE 09/13/2009 0316   HGBUR NEGATIVE 09/13/2009 0316   BILIRUBINUR NEGATIVE 09/13/2009 0316   KETONESUR NEGATIVE 09/13/2009 0316   PROTEINUR NEGATIVE 09/13/2009 0316   UROBILINOGEN 0.2 09/13/2009 0316   NITRITE NEGATIVE 09/13/2009 0316   LEUKOCYTESUR  09/13/2009 0316    NEGATIVE MICROSCOPIC NOT DONE ON URINES WITH NEGATIVE PROTEIN, BLOOD, LEUKOCYTES, NITRITE, OR GLUCOSE <1000 mg/dL.     Radiological Exams on Admission: CT Head Wo Contrast  Result Date: 07/25/2021 CLINICAL DATA:  Head trauma. Complains of confusion and weakness for 5 days.  EXAM: CT HEAD WITHOUT CONTRAST TECHNIQUE: Contiguous axial images were obtained from the base of the skull through the vertex without intravenous  contrast. COMPARISON:  None. FINDINGS: Brain: No evidence of acute infarction, hemorrhage, hydrocephalus, extra-axial collection or mass lesion/mass effect. There is prominence of the CSF spaces overlying the cerebral hemispheres compatible with brain atrophy. Vascular: No hyperdense vessel or unexpected calcification. Skull: Normal. Negative for fracture or focal lesion. Sinuses/Orbits: No acute finding. Other: None IMPRESSION: 1. No acute intracranial abnormalities. 2. Chronic small vessel ischemic change and brain atrophy. Electronically Signed   By: Signa Kell M.D.   On: 07/25/2021 12:19   DG Chest Port 1 View  Result Date: 07/25/2021 CLINICAL DATA:  Weakness. EXAM: PORTABLE CHEST 1 VIEW COMPARISON:  March 02, 2015. FINDINGS: Mild enlargement the cardiac silhouette. Coronary artery stent. Calcific atherosclerosis of the aorta. Both lungs are clear. No visible pleural effusions or pneumothorax. IMPRESSION: 1. No evidence of acute cardiopulmonary disease. 2. Mild cardiomegaly. Electronically Signed   By: Feliberto Harts MD   On: 07/25/2021 12:52    EKG: Independently reviewed.   Lonia Blood, MD [facility] Office  [phone] Pager - Text Page per Amion as per below:  On-Call/Text Page:      [email or web address]  If 7PM-7AM, please contact night-coverage [email or web address] 07/25/2021, 1:10 PM

## 2021-07-25 NOTE — ED Notes (Signed)
Pt transported to CT at this time.

## 2021-07-26 ENCOUNTER — Telehealth: Payer: Self-pay

## 2021-07-26 NOTE — Telephone Encounter (Signed)
Patient called stating her sister wants to review her labwork that she had with Dr. Corliss Skains on 07/19/21.  Patient states she also had labwork at the hospital and if possible to include those results also.  Patient states her sister Remonia Richter will come by the office tomorrow morning, 07/27/21 to pick them up.

## 2021-07-26 NOTE — Telephone Encounter (Signed)
Patient advised we would only be able to provide her with the lab results from our office. Patient advised she would need to contact medical records at the hospital for the labs from that visit. Patient advised she could also check my chart.

## 2021-07-29 ENCOUNTER — Other Ambulatory Visit: Payer: Self-pay

## 2021-07-29 ENCOUNTER — Emergency Department (HOSPITAL_COMMUNITY): Payer: BC Managed Care – PPO

## 2021-07-29 ENCOUNTER — Encounter (HOSPITAL_COMMUNITY): Payer: Self-pay | Admitting: Pharmacy Technician

## 2021-07-29 ENCOUNTER — Emergency Department (HOSPITAL_COMMUNITY)
Admission: EM | Admit: 2021-07-29 | Discharge: 2021-07-29 | Disposition: A | Discharge status: Home / Self Care | Payer: BC Managed Care – PPO | Attending: Emergency Medicine | Admitting: Emergency Medicine

## 2021-07-29 DIAGNOSIS — R531 Weakness: Secondary | ICD-10-CM | POA: Diagnosis not present

## 2021-07-29 DIAGNOSIS — Z87891 Personal history of nicotine dependence: Secondary | ICD-10-CM | POA: Diagnosis not present

## 2021-07-29 DIAGNOSIS — I2510 Atherosclerotic heart disease of native coronary artery without angina pectoris: Secondary | ICD-10-CM | POA: Insufficient documentation

## 2021-07-29 DIAGNOSIS — R5383 Other fatigue: Secondary | ICD-10-CM | POA: Diagnosis present

## 2021-07-29 DIAGNOSIS — Z7982 Long term (current) use of aspirin: Secondary | ICD-10-CM | POA: Diagnosis not present

## 2021-07-29 DIAGNOSIS — Z79899 Other long term (current) drug therapy: Secondary | ICD-10-CM | POA: Diagnosis not present

## 2021-07-29 DIAGNOSIS — N3000 Acute cystitis without hematuria: Secondary | ICD-10-CM

## 2021-07-29 DIAGNOSIS — I10 Essential (primary) hypertension: Secondary | ICD-10-CM | POA: Insufficient documentation

## 2021-07-29 LAB — URINALYSIS, ROUTINE W REFLEX MICROSCOPIC
Bilirubin Urine: NEGATIVE
Glucose, UA: NEGATIVE mg/dL
Hgb urine dipstick: NEGATIVE
Ketones, ur: NEGATIVE mg/dL
Nitrite: NEGATIVE
Protein, ur: NEGATIVE mg/dL
Specific Gravity, Urine: 1.009 (ref 1.005–1.030)
WBC, UA: >50 WBC/hpf — ABNORMAL HIGH (ref 0–5)
pH: 5 (ref 5.0–8.0)

## 2021-07-29 LAB — COMPREHENSIVE METABOLIC PANEL
ALT: 147 U/L — ABNORMAL HIGH (ref 0–44)
AST: 82 U/L — ABNORMAL HIGH (ref 15–41)
Albumin: 2.1 g/dL — ABNORMAL LOW (ref 3.5–5.0)
Alkaline Phosphatase: 121 U/L (ref 38–126)
Anion gap: 7 (ref 5–15)
BUN: 25 mg/dL — ABNORMAL HIGH (ref 8–23)
CO2: 27 mmol/L (ref 22–32)
Calcium: 8.6 mg/dL — ABNORMAL LOW (ref 8.9–10.3)
Chloride: 102 mmol/L (ref 98–111)
Creatinine, Ser: 1.32 mg/dL — ABNORMAL HIGH (ref 0.44–1.00)
GFR, Estimated: 43 mL/min — ABNORMAL LOW (ref >60)
Glucose, Bld: 95 mg/dL (ref 70–99)
Potassium: 5.4 mmol/L — ABNORMAL HIGH (ref 3.5–5.1)
Sodium: 136 mmol/L (ref 135–145)
Total Bilirubin: 0.5 mg/dL (ref 0.3–1.2)
Total Protein: 5.8 g/dL — ABNORMAL LOW (ref 6.5–8.1)

## 2021-07-29 LAB — CBC WITH DIFFERENTIAL/PLATELET
Abs Immature Granulocytes: 0.23 10*3/uL — ABNORMAL HIGH (ref 0.00–0.07)
Basophils Absolute: 0 10*3/uL (ref 0.0–0.1)
Basophils Relative: 1 %
Eosinophils Absolute: 0 10*3/uL (ref 0.0–0.5)
Eosinophils Relative: 0 %
HCT: 36.2 % (ref 36.0–46.0)
Hemoglobin: 11.6 g/dL — ABNORMAL LOW (ref 12.0–15.0)
Immature Granulocytes: 3 %
Lymphocytes Relative: 8 %
Lymphs Abs: 0.5 10*3/uL — ABNORMAL LOW (ref 0.7–4.0)
MCH: 34.3 pg — ABNORMAL HIGH (ref 26.0–34.0)
MCHC: 32 g/dL (ref 30.0–36.0)
MCV: 107.1 fL — ABNORMAL HIGH (ref 80.0–100.0)
Monocytes Absolute: 0.9 10*3/uL (ref 0.1–1.0)
Monocytes Relative: 14 %
Neutro Abs: 5.2 10*3/uL (ref 1.7–7.7)
Neutrophils Relative %: 74 %
Platelets: 335 10*3/uL (ref 150–400)
RBC: 3.38 MIL/uL — ABNORMAL LOW (ref 3.87–5.11)
RDW: 15.9 % — ABNORMAL HIGH (ref 11.5–15.5)
WBC: 6.9 10*3/uL (ref 4.0–10.5)
nRBC: 0 % (ref 0.0–0.2)

## 2021-07-29 LAB — POTASSIUM: Potassium: 5.1 mmol/L (ref 3.5–5.1)

## 2021-07-29 MED ORDER — SODIUM CHLORIDE 0.9 % IV SOLN
1.0000 g | Freq: Once | INTRAVENOUS | Status: AC
  Administered 2021-07-29: 1 g via INTRAVENOUS
  Filled 2021-07-29: qty 10

## 2021-07-29 MED ORDER — CEPHALEXIN 500 MG PO CAPS: 500.0000 mg | ORAL_CAPSULE | Freq: Three times a day (TID) | ORAL | 0 refills | Status: DC

## 2021-07-29 MED ORDER — ONDANSETRON HCL 4 MG/2ML IJ SOLN
4.0000 mg | Freq: Once | INTRAMUSCULAR | Status: AC
  Administered 2021-07-29: 4 mg via INTRAVENOUS
  Filled 2021-07-29: qty 2

## 2021-07-29 MED ORDER — SODIUM CHLORIDE 0.9 % IV BOLUS
1000.0000 mL | Freq: Once | INTRAVENOUS | Status: AC
Start: 2021-07-29 — End: 2021-07-29
  Administered 2021-07-29: 1000 mL via INTRAVENOUS

## 2021-07-29 NOTE — ED Triage Notes (Signed)
Pt here with reports of weakness, fatigue, decreased po intake and intermittent fevers since last Thursday. Pt reports 2 covid test have come back negative. Denies known sick contacts.

## 2021-07-29 NOTE — ED Notes (Signed)
DC instructions reviewed with pt. PT verbalized understanding. PT DC °

## 2021-07-29 NOTE — ED Provider Notes (Signed)
Emergency Medicine Provider Triage Evaluation Note  Vanessa Collins , a 75 y.o. female  was evaluated in triage.  Pt complains of generalized weakness, fatigue, decreased appetite, and intermittent fever since last Thursday.  Patient denies any known sick contacts.  Patient states negative COVID-19 test at doctor's office over this time period.  Denies any chills, URI symptoms, shortness of breath, chest pain, abdominal pain, nausea, vomiting, diarrhea, dysuria, hematuria.  Patient endorses urinary frequency however states that she is drinking a lot of water.  Review of Systems  Positive: eneralized weakness, fatigue, decreased appetite, and intermittent fever Negative: chills, URI symptoms, shortness of breath, chest pain, abdominal pain, nausea, vomiting, diarrhea, dysuria, hematuria  Physical Exam  BP 100/62   Pulse 86   Temp 98.2 F (36.8 C) (Oral)   Resp 14   SpO2 98%  Gen:   Awake, no distress   Resp:  Normal effort, lungs clear to auscultation bilaterally    MSK:   Moves extremities without difficulty, Other:  Abdomen soft, nondistended, nontender.  Medical Decision Making  Medically screening exam initiated at 2:08 PM.  Appropriate orders placed.  TYMBER STALLINGS was informed that the remainder of the evaluation will be completed by another provider, this initial triage assessment does not replace that evaluation, and the importance of remaining in the ED until their evaluation is complete.  The patient appears stable so that the remainder of the work up may be completed by another provider.      Haskel Schroeder, PA-C 07/29/21 1410    Rolan Bucco, MD 07/30/21 [phone]

## 2021-07-29 NOTE — ED Provider Notes (Signed)
[facility] EMERGENCY DEPARTMENT Provider Note   CSN: [id number] Arrival date & time: 07/29/21  1349     History Chief Complaint  Patient presents with   Weakness    Vanessa Collins is a 75 y.o. female with a hx of CAD, hypertension, hyperlipidemia, fibromyalgia, & RA on methotrexate who presents to the ED for weakness for the past 1 week.  Patient states that she feels fatigued with generalized weakness and with that she has had multiple falls with most recent being yesterday.  She does have a history of vertigo but typically this is manageable, she states with the vertigo and weakness combined she is falling.  She has had her head a few times- most recently a few days prior. She also has had intermittent subjective fever, chills, urinary frequency/urgency, dysuria, nausea, decreased appetite, and a few episodes of emesis.  Denies abdominal pain, flank pain, chest pain, dyspnea, diarrhea, or syncope.   HPI     Past Medical History:  Diagnosis Date   Arthritis    rheumatoid , osteo    CAD (coronary artery disease)    PCI to LAD 2010   Diverticulitis    with perforation and colostomy placement     Fibromyalgia    Heart disease    Heart murmur    Hyperlipidemia    Hypertension    Myocardial infarction [facility]) 2008   one Stent   Shingles 03/2020   per patient    Vertigo    per patient    Vitamin D deficiency    Wears dentures    gum disease    Patient Active Problem List   Diagnosis Date Noted   Hypokalemia 07/25/2021   S/P repair of ventral hernia 07/11/2019   Hypertension    Hyperlipidemia    Diverticulitis of rectosigmoid s/p robotic LAR/colostomy takedown 10/09/2018 10/09/2018   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 10/09/2018 10/09/2018   History of depression 06/19/2017   Rheumatoid arthritis involving multiple sites with positive rheumatoid factor (HCC) 01/25/2017   High risk medication use 01/25/2017   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/25/2017   Fibromyalgia 01/25/2017   Primary osteoarthritis of both hands 01/25/2017   Spondylosis of lumbar region without myelopathy or radiculopathy 01/25/2017   DJD (degenerative joint disease), cervical 01/25/2017   History of neutropenia 01/25/2017   History of coronary artery disease 01/25/2017   Lapband APS April 2010 02/25/2013   Wears dentures     Past Surgical History:  Procedure Laterality Date   CESAREAN SECTION     COLECTOMY WITH COLOSTOMY CREATION/HARTMANN PROCEDURE  09/2017   Perforated diverticulitis   CORONARY STENT PLACEMENT  08/2009   EYE SURGERY     LAPAROSCOPIC GASTRIC BANDING  04/05/2009   Dr Daphine Deutscher   PROCTOSCOPY N/A 10/09/2018   Procedure: RIDGID PROCTOSCOPY;  Surgeon: Karie Soda, MD;  Location: [facility];  Service: General;  Laterality: N/A;   VAGINAL HYSTERECTOMY     VENTRAL HERNIA REPAIR N/A 07/11/2019   Procedure: LAPAROSCOPIC ASSISTED VENTRAL HERNIA REPAIR;  Surgeon: Luretha Murphy, MD;  Location: [facility];  Service: General;  Laterality: N/A;     OB History   No obstetric history on file.     Family History  Problem Relation Age of Onset   Macular degeneration Mother    Cancer Father    Rheum arthritis Father    Bipolar disorder Father    Bipolar disorder Daughter    Rheum arthritis Daughter     Social History  Tobacco Use   Smoking status: Former    Packs/day: 0.25    Years: 25.00    Pack years: 6.25    Types: Cigarettes   Smokeless tobacco: Never  Vaping Use   Vaping Use: Never used  Substance Use Topics   Alcohol use: No   Drug use: No    Home Medications Prior to Admission medications   Medication Sig Start Date End Date Taking? Authorizing Provider  ALPRAZolam Prudy Feeler) 0.5 MG tablet Take 1 mg by mouth 3 (three) times daily.    [provider]  aspirin 81 MG tablet Take 81 mg by mouth daily.    [provider]  cetirizine (ZYRTEC) 10 MG tablet Take 10 mg by mouth at bedtime.     [provider]   Cholecalciferol (VITAMIN D3) 5000 units CAPS Take 5,000 Units by mouth daily.     [provider]  estropipate (OGEN) 0.75 MG tablet Take 0.75 mg by mouth daily. 12/23/16   [provider]  famotidine (PEPCID) 20 MG tablet Take 20 mg by mouth 2 (two) times daily.    [provider]  folic acid (FOLVITE) 1 MG tablet TAKE 2 TABLETS BY MOUTH DAILY Patient taking differently: Take 2 mg by mouth daily. 05/04/21   Gearldine Bienenstock, PA-C  hydroxychloroquine (PLAQUENIL) 200 MG tablet Take 1 tablet by mouth twice daily Monday through Friday only. Patient taking differently: Take 200 mg by mouth See admin instructions. Take 1 tablet by mouth twice daily Monday through Friday only. 04/26/21   Gearldine Bienenstock, PA-C  levothyroxine (SYNTHROID) 25 MCG tablet Take 25 mcg by mouth daily before breakfast. 05/27/19   [provider]  methotrexate 50 MG/2ML injection Inject 0.3 mLs (7.5 mg total) into the skin once a week. 07/20/21   Gearldine Bienenstock, PA-C  metoprolol succinate (TOPROL-XL) 25 MG 24 hr tablet Take 25 mg by mouth every evening. Take with or immediately following a meal.    [provider]  Multiple Vitamin (MULTIVITAMIN WITH MINERALS) TABS tablet Take 1 tablet by mouth daily.    [provider]  nitroGLYCERIN (NITROSTAT) 0.4 MG SL tablet Place 0.4 mg under the tongue every 5 (five) minutes as needed for chest pain.     [provider]  PERCOCET 5-325 MG tablet Take 0.5-1 tablets by mouth daily as needed for severe pain. 07/26/20   [provider]  potassium chloride SA (KLOR-CON) 20 MEQ tablet Take 2 tablets (40 mEq total) by mouth daily for 7 days. 07/25/21 08/01/21  Koleen Distance, MD  predniSONE (DELTASONE) 5 MG tablet TAKE 2 TABLETS BY MOUTH EVERY DAY WITH BREAKFAST Patient taking differently: Take 10 mg by mouth daily with breakfast. 05/04/21   Gearldine Bienenstock, PA-C  PREMARIN 0.3 MG tablet Take 0.3 mg by mouth daily. 09/10/18   [provider]  sertraline (ZOLOFT) 50 MG tablet Take 50 mg by mouth daily.     [provider]  simvastatin (ZOCOR) 80 MG tablet Take 80 mg by mouth every evening.     [provider]  traMADol (ULTRAM) 50 MG tablet Take 2 tablets (100 mg total) by mouth 4 (four) times daily. Patient taking differently: Take 100 mg by mouth every 6 (six) hours as needed for moderate pain. 07/12/19   Romie Levee, MD  TUBERCULIN SYR 1CC/27GX1/2" (B-D TB SYRINGE 1CC/27GX1/2") 27G X 1/2" 1 ML MISC Use to inject methotrexate once weekly. 04/29/21   Gearldine Bienenstock, PA-C  Allergies    Vicodin [hydrocodone-acetaminophen] and Aspirin  Review of Systems   Review of Systems  Constitutional:  Positive for chills and fever (subjective).  Respiratory:  Negative for cough and shortness of breath.   Cardiovascular:  Negative for chest pain.  Gastrointestinal:  Positive for nausea and vomiting. Negative for abdominal pain.  Genitourinary:  Positive for dysuria, frequency and urgency.  Neurological:  Positive for weakness (generalized). Negative for syncope.  All other systems reviewed and are negative.  Physical Exam Updated Vital Signs BP 102/61 (BP Location: Left Arm)   Pulse 85   Temp 98.2 F (36.8 C) (Oral)   Resp 18   SpO2 100%   Physical Exam Vitals and nursing note reviewed.  Constitutional:      General: She is not in acute distress.    Appearance: She is well-developed. She is not toxic-appearing.  HENT:     Head: Normocephalic and atraumatic.  Eyes:     General:        Right eye: No discharge.        Left eye: No discharge.     Extraocular Movements: Extraocular movements intact.     Conjunctiva/sclera: Conjunctivae normal.     Pupils: Pupils are equal, round, and reactive to light.  Cardiovascular:     Rate and Rhythm: Normal rate and regular rhythm.     Comments: 2+ symmetric radial and DP pulses bilaterally. Pulmonary:     Effort: Pulmonary effort is normal. No  respiratory distress.     Breath sounds: Normal breath sounds. No wheezing, rhonchi or rales.  Chest:     Chest wall: Tenderness present.  Abdominal:     General: There is no distension.     Palpations: Abdomen is soft.     Tenderness: There is no abdominal tenderness. There is no right CVA tenderness, left CVA tenderness, guarding or rebound.  Musculoskeletal:     Cervical back: Neck supple. Tenderness (Diffuse no point/focal vertebral tenderness or step-off.) present.     Comments: Scattered bruising to the extremities.  Able to actively range bilateral upper/lower extremity major joints.  No focal bony tenderness. No thoracic, lumbar, sacral, or coccygeal tenderness to palpation  Skin:    General: Skin is warm and dry.     Findings: No rash.  Neurological:     Mental Status: She is alert.     Comments: Alert. Clear speech.  Sensation grossly intact bilateral upper and lower extremities.  5-5 symmetric grip strength and strength with plantar dorsiflexion bilaterally.  Negative pronator drift.  Normal finger-to-nose.  Patient ambulatory  Psychiatric:        Behavior: Behavior normal.    ED Results / Procedures / Treatments   Labs (all labs ordered are listed, but only abnormal results are displayed) Labs Reviewed  COMPREHENSIVE METABOLIC PANEL - Abnormal; Notable for the following components:      Result Value   Potassium 5.4 (*)    BUN 25 (*)    Creatinine, Ser 1.32 (*)    Calcium 8.6 (*)    Total Protein 5.8 (*)    Albumin 2.1 (*)    AST 82 (*)    ALT 147 (*)    GFR, Estimated 43 (*)    All other components within normal limits  CBC WITH DIFFERENTIAL/PLATELET - Abnormal; Notable for the following components:   RBC 3.38 (*)    Hemoglobin 11.6 (*)    MCV 107.1 (*)    MCH 34.3 (*)    RDW  15.9 (*)    Lymphs Abs 0.5 (*)    Abs Immature Granulocytes 0.23 (*)    All other components within normal limits  URINALYSIS, ROUTINE W REFLEX MICROSCOPIC - Abnormal; Notable for the  following components:   APPearance CLOUDY (*)    Leukocytes,Ua LARGE (*)    WBC, UA >50 (*)    Bacteria, UA MANY (*)    All other components within normal limits    EKG None  Radiology CT HEAD WO CONTRAST ( )  Result Date: 07/29/2021 CLINICAL DATA:  History of weakness and fatigue for several days EXAM: CT HEAD WITHOUT CONTRAST CT CERVICAL SPINE WITHOUT CONTRAST TECHNIQUE: Multidetector CT imaging of the head and cervical spine was performed following the standard protocol without intravenous contrast. Multiplanar CT image reconstructions of the cervical spine were also generated. COMPARISON:  07/25/2021 FINDINGS: CT HEAD FINDINGS Brain: No evidence of acute infarction, hemorrhage, hydrocephalus, extra-axial collection or mass lesion/mass effect. Chronic atrophic changes are noted with prominence of the CSF spaces along the convexities stable in appearance from the prior exam. Vascular: No hyperdense vessel or unexpected calcification. Skull: Normal. Negative for fracture or focal lesion. Sinuses/Orbits: No acute finding. Other: None. CT CERVICAL SPINE FINDINGS Alignment: Mild straightening of the normal cervical lordosis is noted. Mild anterolisthesis of C3 on C4 and C4 on C5 is noted of a degenerative nature. Skull base and vertebrae: 7 cervical segments are well visualized. Vertebral body height is well maintained. Disc space narrowing is noted at C5-6 and to a lesser degree at C6-7 with associated osteophytic changes. Facet hypertrophic changes are noted with mild anterolisthesis as described above. No acute fracture or acute facet abnormality is noted. Soft tissues and spinal canal: Surrounding soft tissue structures are within normal limits Upper chest: Visualized lung apices are unremarkable. Other: None IMPRESSION: CT of the head: Chronic atrophic changes stable from the prior exam. CT of the cervical spine: Multilevel degenerative change without acute abnormality. Electronically Signed   By:  Alcide Clever M.D.   On: 07/29/2021 20:22   CT Cervical Spine Wo Contrast  Result Date: 07/29/2021 CLINICAL DATA:  History of weakness and fatigue for several days EXAM: CT HEAD WITHOUT CONTRAST CT CERVICAL SPINE WITHOUT CONTRAST TECHNIQUE: Multidetector CT imaging of the head and cervical spine was performed following the standard protocol without intravenous contrast. Multiplanar CT image reconstructions of the cervical spine were also generated. COMPARISON:  07/25/2021 FINDINGS: CT HEAD FINDINGS Brain: No evidence of acute infarction, hemorrhage, hydrocephalus, extra-axial collection or mass lesion/mass effect. Chronic atrophic changes are noted with prominence of the CSF spaces along the convexities stable in appearance from the prior exam. Vascular: No hyperdense vessel or unexpected calcification. Skull: Normal. Negative for fracture or focal lesion. Sinuses/Orbits: No acute finding. Other: None. CT CERVICAL SPINE FINDINGS Alignment: Mild straightening of the normal cervical lordosis is noted. Mild anterolisthesis of C3 on C4 and C4 on C5 is noted of a degenerative nature. Skull base and vertebrae: 7 cervical segments are well visualized. Vertebral body height is well maintained. Disc space narrowing is noted at C5-6 and to a lesser degree at C6-7 with associated osteophytic changes. Facet hypertrophic changes are noted with mild anterolisthesis as described above. No acute fracture or acute facet abnormality is noted. Soft tissues and spinal canal: Surrounding soft tissue structures are within normal limits Upper chest: Visualized lung apices are unremarkable. Other: None IMPRESSION: CT of the head: Chronic atrophic changes stable from the prior exam. CT of the cervical spine: Multilevel degenerative  change without acute abnormality. Electronically Signed   By: Alcide Clever M.D.   On: 07/29/2021 20:22    Procedures Procedures   Medications Ordered in ED Medications - No data to display  ED Course   I have reviewed the triage vital signs and the nursing notes.  Pertinent labs & imaging results that were available during my care of the patient were reviewed by me and considered in my medical decision making (see chart for details).    MDM Rules/Calculators/A&P                           Patient presents to the ED with complaints of generalized weakness, urinary symptoms, and multiple falls.  Nontoxic, vitals without significant abnormality.  Additional history obtained:  Additional history obtained from chart review & nursing note review.   Lab Tests:  I reviewed and interpreted labs, which included:  CBC: Mild anemia, improved from prior visit CMP: BUN decreasing creatinine increased, elevated LFTs.  Patient is hyperkalemic at 5.4, given she was hypokalemic at 2.8 at her last visit we will recheck potassium level--> 5.1 Urinalysis: Consistent with infection, sent for culture.  Imaging Studies ordered:  I ordered imaging studies which included CT head and C-spine given her multiple recent falls with head injury and midline spinal tenderness, I independently reviewed, formal radiology impression shows:  CT of the head: Chronic atrophic changes stable from the prior exam. CT of the cervical spine: Multilevel degenerative change without acute abnormality.  ED Course:  Patient with findings of urinary tract infection.  She is afebrile, nontoxic-appearing, does not appear septic.  Renal function is fairly similar to most recent visit.  She is mildly hyperkalemic which is improving with fluids.  We will have her stop her oral potassium supplementation.  CT imaging is reassuring of head/neck in terms of recent falls.  She is ambulatory in the emergency department.  We discussed discharge home on oral antibiotics versus hospital admission, risks/benefits discussed, patient would like to go home which I feel is reasonable.  She received Rocephin in the emergency department and we will discharge  her with Keflex.  Urine culture sent. Close PCP Follow up and strict return precautions.   I discussed results, treatment plan, need for follow-up, and return precautions with the patient & her husband. Provided opportunity for questions, patient & her husband confirmed understanding and are in agreement with plan.    This is a shared visit with supervising physician Dr. Deretha Emory who has independently evaluated patient & provided guidance in evaluation/management/disposition, in agreement with care   Portions of this note were generated with Dragon dictation software. Dictation errors may occur despite best attempts at proofreading.  Final Clinical Impression(s) / ED Diagnoses Final diagnoses:  Acute cystitis without hematuria  Weakness    Rx / DC Orders ED Discharge Orders          Ordered    cephALEXin (KEFLEX) 500 MG capsule  3 times daily        07/29/21 2309             Mekisha Bittel, Pleas Koch, PA-C 07/29/21 2311    Vanetta Mulders, MD 08/05/21 1600

## 2021-07-29 NOTE — Discharge Instructions (Addendum)
You were seen in the emergency department today for weakness.  Your labs show that your kidney functions mildly elevated, please continue to drink plenty of fluids and have this rechecked by your primary care provider.  Your potassium level was elevated in the ER today, please stop taking your previously prescribed potassium supplement.  Please also have this rechecked by primary care closely.   Your CT scan of your head and neck did not show any acute injuries.  We are sending home with Keflex to treat your urinary tract infection.  Please take this as prescribed.  We have prescribed you new medication(s) today. Discuss the medications prescribed today with your pharmacist as they can have adverse effects and interactions with your other medicines including over the counter and prescribed medications. Seek medical evaluation if you start to experience new or abnormal symptoms after taking one of these medicines, seek care immediately if you start to experience difficulty breathing, feeling of your throat closing, facial swelling, or rash as these could be indications of a more serious allergic reaction    Follow-up with primary care within 3 days.  Return to the emergency department for any new or worsening symptoms including but not limited to new or worsening pain, fever, inability to keep fluids down, passing out, trouble walking, or any other concerns.

## 2021-08-03 ENCOUNTER — Other Ambulatory Visit: Payer: Self-pay | Admitting: Physician Assistant

## 2021-08-03 DIAGNOSIS — M0579 Rheumatoid arthritis with rheumatoid factor of multiple sites without organ or systems involvement: Secondary | ICD-10-CM

## 2021-08-03 NOTE — Telephone Encounter (Signed)
Last Visit: 10/19/2021  Next Visit: 07/19/2021  Labs: 07/29/2021 Potassium 5.4, BUN 25, Creat. 1.32, GFR 43, Calcium 8.6, Total Protein 5.8, Albumin 2.1, AST 82, ALT 147, RBC 3.38, Hgb 11.6, MCV 107.1, MCH 34.3, RDW 15.9, Lymphs Abs 0.5, Abs Immature Granulocytes 0.23  Eye exam: 06/16/2021.   Current Dose per office note 07/19/2021:  Plaquenil 200 mg 1 tablet by mouth BID M-F  DX: Rheumatoid arthritis involving multiple sites with positive rheumatoid factor  Last Fill: 04/26/2021  Okay to refill Plaquenil?

## 2021-08-09 ENCOUNTER — Encounter: Payer: Self-pay | Admitting: Rheumatology

## 2021-08-10 ENCOUNTER — Other Ambulatory Visit: Payer: Self-pay | Admitting: Physician Assistant

## 2021-08-10 MED ORDER — PREDNISONE 5 MG PO TABS: ORAL_TABLET | ORAL | 2 refills | Status: DC

## 2021-08-10 NOTE — Telephone Encounter (Signed)
Attempted to contact the patient and left message for patient to call the office.  

## 2021-08-10 NOTE — Telephone Encounter (Signed)
Last Visit: 10/19/2021   Next Visit: 07/19/2021   Labs: 07/29/2021 Potassium 5.4, BUN 25, Creat. 1.32, GFR 43, Calcium 8.6, Total Protein 5.8, Albumin 2.1, AST 82, ALT 147, RBC 3.38, Hgb 11.6, MCV 107.1, MCH 34.3, RDW 15.9, Lymphs Abs 0.5, Abs Immature Granulocytes 0.23  Current Dose per office note 07/19/2021: Methotrexate 0.6 ml sq injection once weekly. prednisone 5- 10 mg p.o. daily  Okay to refill MTX and Prednisone?

## 2021-08-10 NOTE — Telephone Encounter (Signed)
Patient should discontinue methotrexate due to elevated creatinine and low GFR.  She also has elevated LFTs.

## 2021-08-11 ENCOUNTER — Telehealth: Payer: Self-pay

## 2021-08-11 NOTE — Telephone Encounter (Signed)
Received DEXA results from [facility].  Date of Scan: 08/09/2021 Lowest T-score and lowest site measured:0.1 BMD 0.858 left femoral neck. Significant changes in BMD and site measured (5% and above):n/a  Current Regimen:calcium, vitamin D  Recommendation: n/a  Reviewed by:Dr. Pollyann Savoy  Next Appointment:  10/19/2021  Attempted to contact patient and left message on machine to advise patient of normal bone density results.

## 2021-09-07 ENCOUNTER — Other Ambulatory Visit: Payer: Self-pay | Admitting: Physician Assistant

## 2021-09-07 MED ORDER — METHOTREXATE SODIUM CHEMO INJECTION 50 MG/2ML: 7.5000 mg | INTRAMUSCULAR | 0 refills | Status: DC

## 2021-09-07 NOTE — Telephone Encounter (Signed)
Last Visit: 10/19/2021   Next Visit: 07/19/2021   Labs: 07/29/2021 Potassium 5.4, BUN 25, Creat. 1.32, GFR 43, Calcium 8.6, Total Protein 5.8, Albumin 2.1, AST 82, ALT 147, RBC 3.38, Hgb 11.6, MCV 107.1, MCH 34.3, RDW 15.9, Lymphs Abs 0.5, Abs Immature Granulocytes 0.23   Current Dose per office note 07/19/2021: prednisone 5- 10 mg p.o. daily. Per lab note 07/20/2021: decrease methotrexate to 0.3 mL subcu weekly  Okay to refill MTX?

## 2021-09-07 NOTE — Telephone Encounter (Signed)
Is the patient requesting a refill or no print to reflect dose change?

## 2021-09-21 ENCOUNTER — Encounter (HOSPITAL_BASED_OUTPATIENT_CLINIC_OR_DEPARTMENT_OTHER): Payer: Self-pay | Admitting: Surgery

## 2021-09-21 ENCOUNTER — Other Ambulatory Visit: Payer: Self-pay

## 2021-09-26 ENCOUNTER — Encounter (HOSPITAL_COMMUNITY): Payer: Self-pay | Admitting: Anesthesiology

## 2021-10-05 ENCOUNTER — Other Ambulatory Visit: Payer: Self-pay | Admitting: Physician Assistant

## 2021-10-05 MED ORDER — PREDNISONE 5 MG PO TABS: ORAL_TABLET | ORAL | 2 refills | Status: DC

## 2021-10-05 NOTE — Telephone Encounter (Signed)
Last Visit: 10/19/2021   Next Visit: 07/19/2021  Current Dose per office note 07/19/2021: prednisone 5- 10 mg p.o. daily  Last Fill: 08/10/2021  Okay to refill Prednisone?

## 2021-10-07 NOTE — Progress Notes (Deleted)
Office Visit Note  Patient: Vanessa Collins             Date of Birth: August 04, 1946           MRN: [id number]             PCP: Clovis Riley, L.August Saucer, MD Referring: Clovis Riley, Elbert Ewings.August Saucer, MD Visit Date: 10/19/2021 Occupation: @GUAROCC @  Subjective:  No chief complaint on file.   History of Present Illness: Vanessa Collins is a 75 y.o. female ***   Activities of Daily Living:  Patient reports morning stiffness for *** {minute/hour:19697}.   Patient {ACTIONS;DENIES/REPORTS:21021675::"Denies"} nocturnal pain.  Difficulty dressing/grooming: {ACTIONS;DENIES/REPORTS:21021675::"Denies"} Difficulty climbing stairs: {ACTIONS;DENIES/REPORTS:21021675::"Denies"} Difficulty getting out of chair: {ACTIONS;DENIES/REPORTS:21021675::"Denies"} Difficulty using hands for taps, buttons, cutlery, and/or writing: {ACTIONS;DENIES/REPORTS:21021675::"Denies"}  No Rheumatology ROS completed.   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 10/09/2018 10/09/2018   History of depression 06/19/2017   Rheumatoid arthritis involving multiple sites with positive rheumatoid factor (HCC) 01/25/2017   High risk medication use 01/25/2017   Immunosuppression (HCC) 01/25/2017   Fibromyalgia 01/25/2017   Primary osteoarthritis of both hands 01/25/2017   Spondylosis of lumbar region without myelopathy or radiculopathy 01/25/2017   DJD (degenerative joint disease), cervical 01/25/2017   History of neutropenia 01/25/2017   History of coronary artery disease 01/25/2017   Lapband APS April 2010 02/25/2013   Wears dentures     Past Medical History:  Diagnosis Date   Arthritis    rheumatoid , osteo    CAD (coronary artery disease)    PCI to LAD 2010    Diverticulitis    with perforation and colostomy placement     Fibromyalgia    Heart disease    Heart murmur    Hyperlipidemia    Hypertension    Myocardial infarction (HCC) 2008   one Stent   Shingles 03/2020   per patient    Vertigo    per patient    Vitamin D deficiency    Wears dentures    gum disease    Family History  Problem Relation Age of Onset   Macular degeneration Mother    Cancer Father    Rheum arthritis Father    Bipolar disorder Father    Bipolar disorder Daughter    Rheum arthritis Daughter    Past Surgical History:  Procedure Laterality Date   CESAREAN SECTION     COLECTOMY WITH COLOSTOMY CREATION/HARTMANN PROCEDURE  09/2017   Perforated diverticulitis   CORONARY STENT PLACEMENT  08/2009   EYE SURGERY     LAPAROSCOPIC GASTRIC BANDING  04/05/2009   Dr 06/05/2009   PROCTOSCOPY N/A 10/09/2018   Procedure: RIDGID PROCTOSCOPY;  Surgeon: 10/11/2018, MD;  Location: [facility];  Service: General;  Laterality: N/A;   VAGINAL HYSTERECTOMY     VENTRAL HERNIA REPAIR N/A 07/11/2019   Procedure: LAPAROSCOPIC ASSISTED VENTRAL HERNIA REPAIR;  Surgeon: 07/13/2019, MD;  Location: [facility];  Service: General;  Laterality: N/A;   Social History   Social History Narrative   Not on file   Immunization History  Administered Date(s) Administered   PFIZER(Purple Top)SARS-COV-2 Vaccination 02/22/2020, 04/07/2020     Objective: Vital Signs: There were no vitals taken for this visit.   Physical Exam   Musculoskeletal Exam: ***  CDAI Exam: CDAI Score: -- Patient Global: --; Provider Global: -- Swollen: --; Tender: -- Joint Exam 10/19/2021   No joint exam has been documented for this visit   There is currently no information documented on the homunculus. Go to the Rheumatology activity and complete the homunculus joint exam.  Investigation: No additional findings.  Imaging: No results found.  Recent Labs: Lab Results  Component Value Date   WBC 6.9  07/29/2021   HGB 11.6 (L) 07/29/2021   PLT 335 07/29/2021   NA 136 07/29/2021   K 5.1 07/29/2021   CL 102 07/29/2021   CO2 27 07/29/2021   GLUCOSE 95 07/29/2021   BUN 25 (H) 07/29/2021   CREATININE 1.32 (H) 07/29/2021   BILITOT 0.5 07/29/2021   ALKPHOS 121 07/29/2021   AST 82 (H) 07/29/2021   ALT 147 (H) 07/29/2021   PROT 5.8 (L) 07/29/2021   ALBUMIN 2.1 (L) 07/29/2021   CALCIUM 8.6 (L) 07/29/2021   GFRAA 72 04/26/2021    Speciality Comments: PLQ eye exam:06/16/2021. Dr. Ander Purpura. Follow up in 1 year.   Prior therapy includes: Harriette Ohara (GI perforation), Humira (inadequate response), Enbrel (inadequate response), and Orencia (nausea and headaches)  Procedures:  No procedures performed Allergies: Vicodin [hydrocodone-acetaminophen] and Aspirin   Assessment / Plan:     Visit Diagnoses: No diagnosis found.  Orders: No orders of the defined types were placed in this encounter.  No orders of the defined types were placed in this encounter.   Face-to-face time spent with patient was *** minutes. Greater than 50% of time was spent in counseling and coordination of care.  Follow-Up Instructions: No follow-ups on file.   Ellen Henri, CMA  Note - This record has been created using Animal nutritionist.  Chart creation errors have been sought, but may not always  have been located. Such creation errors do not reflect on  the standard of medical care.

## 2021-10-12 ENCOUNTER — Other Ambulatory Visit: Payer: Self-pay | Admitting: Physician Assistant

## 2021-10-12 DIAGNOSIS — M0579 Rheumatoid arthritis with rheumatoid factor of multiple sites without organ or systems involvement: Secondary | ICD-10-CM

## 2021-10-19 ENCOUNTER — Ambulatory Visit: Payer: BC Managed Care – PPO | Admitting: Rheumatology

## 2021-10-19 DIAGNOSIS — R5383 Other fatigue: Secondary | ICD-10-CM

## 2021-10-19 DIAGNOSIS — M5136 Other intervertebral disc degeneration, lumbar region: Secondary | ICD-10-CM

## 2021-10-19 DIAGNOSIS — Z7952 Long term (current) use of systemic steroids: Secondary | ICD-10-CM

## 2021-10-19 DIAGNOSIS — E559 Vitamin D deficiency, unspecified: Secondary | ICD-10-CM

## 2021-10-19 DIAGNOSIS — M797 Fibromyalgia: Secondary | ICD-10-CM

## 2021-10-19 DIAGNOSIS — Z9884 Bariatric surgery status: Secondary | ICD-10-CM

## 2021-10-19 DIAGNOSIS — Z8679 Personal history of other diseases of the circulatory system: Secondary | ICD-10-CM

## 2021-10-19 DIAGNOSIS — R42 Dizziness and giddiness: Secondary | ICD-10-CM

## 2021-10-19 DIAGNOSIS — M0579 Rheumatoid arthritis with rheumatoid factor of multiple sites without organ or systems involvement: Secondary | ICD-10-CM

## 2021-10-19 DIAGNOSIS — Z8659 Personal history of other mental and behavioral disorders: Secondary | ICD-10-CM

## 2021-10-19 DIAGNOSIS — M19041 Primary osteoarthritis, right hand: Secondary | ICD-10-CM

## 2021-10-19 DIAGNOSIS — M5030 Other cervical disc degeneration, unspecified cervical region: Secondary | ICD-10-CM

## 2021-10-19 DIAGNOSIS — Z13820 Encounter for screening for osteoporosis: Secondary | ICD-10-CM

## 2021-10-19 DIAGNOSIS — Z8639 Personal history of other endocrine, nutritional and metabolic disease: Secondary | ICD-10-CM

## 2021-10-19 DIAGNOSIS — Z933 Colostomy status: Secondary | ICD-10-CM

## 2021-10-19 DIAGNOSIS — M19042 Primary osteoarthritis, left hand: Secondary | ICD-10-CM

## 2021-10-19 DIAGNOSIS — I10 Essential (primary) hypertension: Secondary | ICD-10-CM

## 2021-10-19 DIAGNOSIS — Z79899 Other long term (current) drug therapy: Secondary | ICD-10-CM

## 2021-10-20 ENCOUNTER — Encounter (HOSPITAL_BASED_OUTPATIENT_CLINIC_OR_DEPARTMENT_OTHER): Payer: Self-pay | Admitting: Surgery

## 2021-10-25 ENCOUNTER — Ambulatory Visit: Payer: BC Managed Care – PPO | Admitting: Rheumatology

## 2021-10-27 NOTE — H&P (Signed)
REFERRING PHYSICIAN: Alyson Ingles, PA  PROVIDER: Katha Cabal, MD  MRN: [id number] DOB: [birth year] DATE OF ENCOUNTER: 09/14/2021  Subjective   Chief Complaint: Epidermal Cyst   History of Present Illness: Vanessa Collins is a 75 y.o. female who is seen today as an office consultation at the request of Dr. Sherlyn Lick for evaluation of Epidermal Cyst .   She has had a palpable mass in the midportion of her back between her clavicles for some time. She is followed by Dr. Lupe Carney. It appears to be a sebaceous cyst and she wanted it excised since it is over the bones and does bother her from time to time.  Review of Systems: See HPI as well for other ROS.  ROS   Medical History: Past Medical History:  Diagnosis Date   Arthritis   Hypertension   There is no problem list on file for this patient.  Past Surgical History:  Procedure Laterality Date   COLON SURGERY    Allergies  Allergen Reactions   Hydrocodone-Acetaminophen Other (See Comments)  Upset stomach Upset stomach   Aspirin Other (See Comments)  Stomach cramping Stomach cramping   Current Outpatient Medications on File Prior to Visit  Medication Sig Dispense Refill   folic acid (FOLVITE) 1 MG tablet Take 2 tablets by mouth once daily   hydrOXYchloroQUINE (PLAQUENIL) 200 mg tablet Take 1 tablet by mouth twice daily Monday through Friday only.   oxyCODONE-acetaminophen (PERCOCET) 5-325 mg tablet TAKE 1/2 TO 1 TABLET BY MOUTH EVERY DAY as needed for pain   predniSONE (DELTASONE) 5 MG tablet   ALPRAZolam (XANAX) 0.5 MG tablet Take by mouth   BD TUBERCULIN SYRINGE 1 mL 27 x 1/2" Syrg Use to inject methotrexate once weekly.   cetirizine (ZYRTEC) 10 MG tablet Take 10 mg by mouth   cholecalciferol (VITAMIN D3) 5,000 unit capsule Take by mouth   famotidine (PEPCID) 20 MG tablet Take 20 mg by mouth   levothyroxine (SYNTHROID) 25 MCG tablet Take 25 mcg by mouth once daily   methotrexate, PF, 25 mg/mL  chemo injection INJECT 0.6 ML SUBCUTANEOUSLY ONCE WEEKLY, discard remainder OF vial AFTER USE.   metoprolol succinate (TOPROL-XL) 25 MG XL tablet Take 25 mg by mouth   multivitamin with minerals tablet Take 1 tablet by mouth once daily   nitroGLYcerin (NITROSTAT) 0.4 MG SL tablet Place under the tongue at bedtime as needed   potassium chloride (KLOR-CON) 20 MEQ ER tablet TAKE 2 TABLETS BY MOUTH DAILY FOR 7 DAYS   PREMARIN 0.3 mg tablet Take 0.3 mg by mouth once daily   sertraline (ZOLOFT) 50 MG tablet Take by mouth   simvastatin (ZOCOR) 80 MG tablet Take by mouth   traMADoL (ULTRAM) 50 mg tablet Take 100 mg by mouth every 6 (six) hours as needed   No current facility-administered medications on file prior to visit.   Family History  Problem Relation Age of Onset   Obesity Mother   High blood pressure (Hypertension) Mother   Coronary Artery Disease (Blocked arteries around heart) Mother    Social History   Tobacco Use  Smoking Status Former Smoker  Smokeless Tobacco Never Used    Social History   Socioeconomic History   Marital status: Unknown  Tobacco Use   Smoking status: Former Smoker   Smokeless tobacco: Never Used  Substance and Sexual Activity   Alcohol use: Never   Drug use: Never   Objective:   Vitals:  09/14/21 1422  BP: 120/80  Pulse: 51  Temp: 36.5 C (97.7 F)  SpO2: 96%   There is no height or weight on file to calculate BMI.  Physical Exam General: Well developed and maintained white female no acute distress HEENT : She is dealing with vertigo that occurred after her second Pfizer vaccine Chest: On the back between her clavicles there is a punctum and a 1.2cm palpable mass consistent with a sebaceous or epidermoid cyst Heart: Not examined Breast: Not examined Abdomen: Not examined GU not examined Rectal not examined Extremities she does have some broken blood vessels and skin changes on her right leg from a an outbreak of shingles Neuro vertigo  is being worked up. I did not examine her for vertigo today  Labs, Imaging and Diagnostic Testing: None examined  Assessment and Plan:  Diagnoses and all orders for this visit:  Sebaceous cyst    We will plan excision under local at the outpatient setting. I have explained what we would do and she is already read about it on the Internet.  No follow-ups on file.  Jordane Hisle Charna Busman, MD

## 2021-10-28 ENCOUNTER — Encounter (HOSPITAL_BASED_OUTPATIENT_CLINIC_OR_DEPARTMENT_OTHER)
Admission: RE | Disposition: A | Discharge status: Home / Self Care | Payer: Self-pay | Source: Home / Self Care | Attending: Surgery

## 2021-10-28 ENCOUNTER — Encounter (HOSPITAL_BASED_OUTPATIENT_CLINIC_OR_DEPARTMENT_OTHER): Payer: Self-pay | Admitting: Surgery

## 2021-10-28 ENCOUNTER — Ambulatory Visit (HOSPITAL_BASED_OUTPATIENT_CLINIC_OR_DEPARTMENT_OTHER)
Admission: RE | Admit: 2021-10-28 | Discharge: 2021-10-28 | Disposition: A | Discharge status: Home / Self Care | Payer: BC Managed Care – PPO | Attending: Surgery | Admitting: Surgery

## 2021-10-28 SURGERY — MINOR EXCISION OF MASS
Anesthesia: LOCAL

## 2021-10-28 MED ORDER — CHLORHEXIDINE GLUCONATE CLOTH 2 % EX PADS: 6.0000 | MEDICATED_PAD | Freq: Once | CUTANEOUS | Status: DC

## 2021-10-28 MED ORDER — CEFAZOLIN SODIUM-DEXTROSE 2-4 GM/100ML-% IV SOLN: 2.0000 g | INTRAVENOUS | Status: DC

## 2021-10-28 NOTE — Progress Notes (Signed)
Patient needs to cancel surgery and reschedule because of case delay. Patient instructed to call Dr. Norva Riffle office and reschedule for a better time and date.

## 2021-11-02 ENCOUNTER — Other Ambulatory Visit: Payer: Self-pay | Admitting: Rheumatology

## 2021-11-02 ENCOUNTER — Other Ambulatory Visit: Payer: Self-pay | Admitting: Physician Assistant

## 2021-11-02 ENCOUNTER — Encounter: Payer: Self-pay | Admitting: Physician Assistant

## 2021-11-02 ENCOUNTER — Ambulatory Visit (INDEPENDENT_AMBULATORY_CARE_PROVIDER_SITE_OTHER): Payer: BC Managed Care – PPO | Admitting: Physician Assistant

## 2021-11-02 ENCOUNTER — Other Ambulatory Visit: Payer: Self-pay

## 2021-11-02 ENCOUNTER — Encounter (HOSPITAL_BASED_OUTPATIENT_CLINIC_OR_DEPARTMENT_OTHER): Payer: Self-pay | Admitting: Surgery

## 2021-11-02 VITALS — BP 136/85 | HR 61 | Ht 64.0 in | Wt 146.0 lb

## 2021-11-02 DIAGNOSIS — M51369 Other intervertebral disc degeneration, lumbar region without mention of lumbar back pain or lower extremity pain: Secondary | ICD-10-CM

## 2021-11-02 DIAGNOSIS — Z7952 Long term (current) use of systemic steroids: Secondary | ICD-10-CM

## 2021-11-02 DIAGNOSIS — Z9884 Bariatric surgery status: Secondary | ICD-10-CM

## 2021-11-02 DIAGNOSIS — Z79899 Other long term (current) drug therapy: Secondary | ICD-10-CM

## 2021-11-02 DIAGNOSIS — Z8659 Personal history of other mental and behavioral disorders: Secondary | ICD-10-CM

## 2021-11-02 DIAGNOSIS — M0579 Rheumatoid arthritis with rheumatoid factor of multiple sites without organ or systems involvement: Secondary | ICD-10-CM

## 2021-11-02 DIAGNOSIS — M797 Fibromyalgia: Secondary | ICD-10-CM

## 2021-11-02 DIAGNOSIS — I10 Essential (primary) hypertension: Secondary | ICD-10-CM

## 2021-11-02 DIAGNOSIS — R778 Other specified abnormalities of plasma proteins: Secondary | ICD-10-CM

## 2021-11-02 DIAGNOSIS — M19041 Primary osteoarthritis, right hand: Secondary | ICD-10-CM | POA: Diagnosis not present

## 2021-11-02 DIAGNOSIS — Z13820 Encounter for screening for osteoporosis: Secondary | ICD-10-CM

## 2021-11-02 DIAGNOSIS — Z8679 Personal history of other diseases of the circulatory system: Secondary | ICD-10-CM

## 2021-11-02 DIAGNOSIS — E559 Vitamin D deficiency, unspecified: Secondary | ICD-10-CM

## 2021-11-02 DIAGNOSIS — M19042 Primary osteoarthritis, left hand: Secondary | ICD-10-CM

## 2021-11-02 DIAGNOSIS — M5136 Other intervertebral disc degeneration, lumbar region: Secondary | ICD-10-CM

## 2021-11-02 DIAGNOSIS — Z933 Colostomy status: Secondary | ICD-10-CM

## 2021-11-02 DIAGNOSIS — R42 Dizziness and giddiness: Secondary | ICD-10-CM

## 2021-11-02 DIAGNOSIS — Z8639 Personal history of other endocrine, nutritional and metabolic disease: Secondary | ICD-10-CM

## 2021-11-02 DIAGNOSIS — M5030 Other cervical disc degeneration, unspecified cervical region: Secondary | ICD-10-CM

## 2021-11-02 DIAGNOSIS — R5383 Other fatigue: Secondary | ICD-10-CM

## 2021-11-02 MED ORDER — HYDROXYCHLOROQUINE SULFATE 200 MG PO TABS: ORAL_TABLET | ORAL | 0 refills | Status: DC

## 2021-11-02 NOTE — Patient Instructions (Signed)
Standing Labs We placed an order today for your standing lab work.   Please have your standing labs drawn in February and every 3 months   If possible, please have your labs drawn 2 weeks prior to your appointment so that the provider can discuss your results at your appointment.  Please note that you may see your imaging and lab results in MyChart before we have reviewed them. We may be awaiting multiple results to interpret others before contacting you. Please allow our office up to 72 hours to thoroughly review all of the results before contacting the office for clarification of your results.  We have open lab daily: Monday through Thursday from 1:30-4:30 PM and Friday from 1:30-4:00 PM at the office of Dr. Shaili Deveshwar, [facility].   Please be advised, all patients with office appointments requiring lab work will take precedent over walk-in lab work.  If possible, please come for your lab work on Monday and Friday afternoons, as you may experience shorter wait times. The office is located at [address], [address], [place], [place] [address] No appointment is necessary.   Labs are drawn by [facility]. Please bring your co-pay at the time of your lab draw.  You may receive a bill from [facility] for your lab work.  If you wish to have your labs drawn at another location, please call the office 24 hours in advance to send orders.  If you have any questions regarding directions or hours of operation,  please call [phone].   As a reminder, please drink plenty of water prior to coming for your lab work. Thanks!  

## 2021-11-02 NOTE — Progress Notes (Signed)
Office Visit Note  Patient: Vanessa Collins             Date of Birth: [birth year]           MRN: [id number]             PCP: Alroy Dust, L.Marlou Sa, MD Referring: Alroy Dust, Carlean Jews.Marlou Sa, MD Visit Date: 11/02/2021 Occupation: @GUAROCC @  Subjective:  Discuss DEXA results   History of Present Illness: Vanessa Collins is a 75 y.o. female with history of seropositive rheumatoid arthritis and DDD.  She is on methotrexate 0.6 ml sq injections once weekly, folic acid 1 mg daily, and plaquenil 200 mg 1 tablet by mouth twice daily Monday through Friday.  She is tolerating these medications without any side effects.  According to the patient she had a UTI about 3 weeks ago and her methotrexate for about 2 weeks and resumed last night.  She denies any increased joint pain or joint swelling while off of methotrexate.  She continues to she takes prednisone 5-10 mg daily.  She denies any increased joint swelling at this time.  She continues to take tramadol as needed for pain relief. She will be undergoing an excision of a sebaceous cyst on her back on 11/08/21 performed by Dr. Hassell Done.    Activities of Daily Living:  Patient reports morning stiffness for 4 hours.   Patient Denies nocturnal pain.  Difficulty dressing/grooming: Denies Difficulty climbing stairs: Denies Difficulty getting out of chair: Denies Difficulty using hands for taps, buttons, cutlery, and/or writing: Reports  Review of Systems  Constitutional:  Positive for fatigue.  HENT:  Negative for mouth sores, mouth dryness and nose dryness.   Eyes:  Negative for pain, itching and dryness.  Respiratory:  Negative for shortness of breath and difficulty breathing.   Cardiovascular:  Negative for chest pain and palpitations.  Gastrointestinal:  Negative for blood in stool, constipation and diarrhea.  Endocrine: Negative for increased urination.  Genitourinary:  Negative for difficulty urinating.  Musculoskeletal:  Positive for joint pain, joint pain, joint  swelling, myalgias, morning stiffness, muscle tenderness and myalgias.  Skin:  Negative for color change, rash and redness.  Allergic/Immunologic: Positive for susceptible to infections.  Neurological:  Positive for dizziness. Negative for numbness, headaches, memory loss and weakness.  Hematological:  Positive for bruising/bleeding tendency.  Psychiatric/Behavioral:  Negative for confusion.    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 10/09/2018 10/09/2018   History of depression 06/19/2017   Rheumatoid arthritis involving multiple sites with positive rheumatoid factor ([place]) 01/25/2017   High risk medication use 01/25/2017   Immunosuppression ([place]) 01/25/2017   Fibromyalgia 01/25/2017   Primary osteoarthritis of both hands 01/25/2017   Spondylosis of lumbar region without myelopathy or radiculopathy 01/25/2017   DJD (degenerative joint disease), cervical 01/25/2017   History of neutropenia 01/25/2017   History of coronary artery disease 01/25/2017   Lapband APS April 2010 02/25/2013   Wears dentures     Past Medical History:  Diagnosis Date   Arthritis    rheumatoid , osteo    CAD (coronary artery disease)    PCI to LAD 2010   Diverticulitis    with perforation and colostomy placement     Fibromyalgia    Heart disease  Heart murmur    Hyperlipidemia    Hypertension    Myocardial infarction [facility]) 2008   one Stent   Shingles 03/2020   per patient    Vertigo    per patient    Vitamin D deficiency    Wears dentures    gum disease    Family History  Problem Relation Age of Onset   Macular degeneration Mother    Cancer Father    Rheum arthritis Father    Bipolar disorder Father    Bipolar  disorder Daughter    Rheum arthritis Daughter    Past Surgical History:  Procedure Laterality Date   CESAREAN SECTION     COLECTOMY WITH COLOSTOMY CREATION/HARTMANN PROCEDURE  09/2017   Perforated diverticulitis   CORONARY STENT PLACEMENT  08/2009   EYE SURGERY     LAPAROSCOPIC GASTRIC BANDING  04/05/2009   Dr Hassell Done   PROCTOSCOPY N/A 10/09/2018   Procedure: RIDGID PROCTOSCOPY;  Surgeon: Michael Boston, MD;  Location: [facility];  Service: General;  Laterality: N/A;   VAGINAL HYSTERECTOMY     VENTRAL HERNIA REPAIR N/A 07/11/2019   Procedure: LAPAROSCOPIC ASSISTED VENTRAL HERNIA REPAIR;  Surgeon: Johnathan Hausen, MD;  Location: [facility];  Service: General;  Laterality: N/A;   Social History   Social History Narrative   Not on file   Immunization History  Administered Date(s) Administered   PFIZER(Purple Top)SARS-COV-2 Vaccination 02/22/2020, 04/07/2020     Objective: Vital Signs: BP 136/85 (BP Location: Left Arm, Patient Position: Sitting, Cuff Size: Normal)   Pulse 61   Ht 5\' 4"  (1.626 m)   Wt 146 lb (66.2 kg)   BMI 25.06 kg/m    Physical Exam Vitals and nursing note reviewed.  Constitutional:      Appearance: She is well-developed.  HENT:     Head: Normocephalic and atraumatic.  Eyes:     Conjunctiva/sclera: Conjunctivae normal.  Pulmonary:     Effort: Pulmonary effort is normal.  Abdominal:     Palpations: Abdomen is soft.  Musculoskeletal:     Cervical back: Normal range of motion.  Skin:    General: Skin is warm and dry.     Capillary Refill: Capillary refill takes less than 2 seconds.  Neurological:     Mental Status: She is alert and oriented to person, place, and time.  Psychiatric:        Behavior: Behavior normal.     Musculoskeletal Exam: C-spine is slightly limited range of motion with lateral rotation.  Thoracic and lumbar spine good range of motion.  Shoulder joints, elbow joints, wrist joints, MCPs, PIPs, DIPs have good range of motion with no  synovitis.  Synovial thickening over the right second MCP joint noted.  PIP and DIP thickening consistent with osteoarthritis of both hands.  CMC joint thickening and prominence noted bilaterally.  Hip joints have good range of motion with some discomfort in the left hip.  Knee joints have good range of motion with no warmth or effusion.  Subluxation and thickening of both ankle joints noted.  Tenderness and warmth of the left ankle noted.  No tenderness over MTP joints.  CDAI Exam: CDAI Score: 1.1  Patient Global: 6 mm; Provider Global: 5 mm Swollen: 1 ; Tender: 2  Joint Exam 11/02/2021      Right  Left  Ankle   Tender  Swollen Tender     Investigation: No additional findings.  Imaging: No results found.  Recent Labs: Lab Results  Component Value Date  WBC 6.9 07/29/2021   HGB 11.6 (L) 07/29/2021   PLT 335 07/29/2021   NA 136 07/29/2021   K 5.1 07/29/2021   CL 102 07/29/2021   CO2 27 07/29/2021   GLUCOSE 95 07/29/2021   BUN 25 (H) 07/29/2021   CREATININE 1.32 (H) 07/29/2021   BILITOT 0.5 07/29/2021   ALKPHOS 121 07/29/2021   AST 82 (H) 07/29/2021   ALT 147 (H) 07/29/2021   PROT 5.8 (L) 07/29/2021   ALBUMIN 2.1 (L) 07/29/2021   CALCIUM 8.6 (L) 07/29/2021   GFRAA 72 04/26/2021    Speciality Comments: PLQ eye exam:06/16/2021. Dr. Laurence Aly. Follow up in 1 year.   Prior therapy includes: Morrie Sheldon (GI perforation), Humira (inadequate response), Enbrel (inadequate response), and Orencia (nausea and headaches)  Procedures:  No procedures performed Allergies: Vicodin [hydrocodone-acetaminophen] and Aspirin     Assessment / Plan:     Visit Diagnoses: Rheumatoid arthritis involving multiple sites with positive rheumatoid factor ([place]) -She has synovial thickening and mild subluxation of both ankle joints on examination.  Tenderness and warmth of the left ankle was noted.  She has been experiencing less frequent and less severe rheumatoid arthritis flares.  Overall her  rheumatoid arthritis has been stable on the current treatment regimen.  She is currently taking Plaquenil 200 mg 1 tablet by mouth twice daily Monday through Friday, methotrexate 0.6 mL sq injections once weekly, and prednisone 5 mg alternating with 10 mg every other day.  She is tolerating these medications without any side effects.  She tries to remain active exercising on a regular basis.  She plans on renewing her [employer] and would like to return to the pool for exercise.  She will remain on the current treatment regimen.  A refill of Plaquenil was sent to the pharmacy today.  She was advised to notify us if she develops increased joint pain or joint swelling.  She will follow-up in the office in 3 months.   Plan: hydroxychloroquine (PLAQUENIL) 200 MG tablet  High risk medication use - Methotrexate 0.6 ml sq inj once weekly, folic acid 1 mg daily, Plaquenil 200 mg 1 tablet by mouth BID M-F, prednisone 7.5-10 mg p.o. daily.  A refill of Plaquenil sent to the pharmacy today. CBC and CMP updated on 07/29/21.  She is due to update lab work today.  Orders for CBC and CMP released today.  Her next lab work will be due in February and every 3 months.  Standing orders placed today.  PLQ eye exam:06/16/2021. Dr. Laurence Aly. Follow up in 1 year.    - Plan: CBC with Differential/Platelet, COMPLETE METABOLIC PANEL WITH GFR, CBC with Differential/Platelet, COMPLETE METABOLIC PANEL WITH GFR She had a UTI about 3 weeks ago and held methotrexate for 2 weeks while the infection completely cleared.  Discussed the importance of holding methotrexate anytime she develops signs or symptoms of an infection and to resume once the infection has completely cleared.  She voiced understanding.  Current chronic use of systemic steroids - Prednisone 5-10 mg daily.  She has been alternating prednisone 5 mg with 10 mg every other day.  She is aware of the risks of long-term prednisone use. DEXA was within normal limits on  08/09/2021.  We will continue to monitor DEXA every 2 years while on long-term prednisone.  Primary osteoarthritis of both hands: She has PIP, DIP, and CMC joint thickening consistent with osteoarthritis of both hands.  Discussed the importance of joint protection and muscle strengthening.  DDD (  degenerative disc disease), cervical: She has slightly limited range of motion with lateral rotation.  She performs neck stretching exercises on a daily basis.  No symptoms of radiculopathy at this time.  DDD (degenerative disc disease), lumbar: She continues to experience intermittent lower back pain.  Vitamin D deficiency: She takes vitamin D 5000 units daily.  Screening for osteoporosis: DEXA updated on 08/09/21: LFN BMD 0.858 with T-score 0.1. Recommended repeating DEXA in 2 years.  Discussed the importance of performing resistive exercises.  She continues to take vitamin D 5000 units daily.  Fibromyalgia - She experiences intermittent myalgias and muscle tenderness due to fibromyalgia.  She continues to take tramadol 50 mg 2 tablets 4 times daily and Percocet as needed for pain relief.  Discussed the importance of regular exercise and good sleep hygiene.  Other fatigue: Stable.   Other medical conditions are listed as follows:   History of depression  Vertigo  Status post colostomy [facility])  History of hyperlipidemia  Lapband APS April 2010  History of coronary artery disease  Essential hypertension, benign  Abnormal SPEP - SPEP ordered today. Plan: Serum protein electrophoresis with reflex  Orders: Orders Placed This Encounter  Procedures   CBC with Differential/Platelet   COMPLETE METABOLIC PANEL WITH GFR   CBC with Differential/Platelet   COMPLETE METABOLIC PANEL WITH GFR   Serum protein electrophoresis with reflex   Meds ordered this encounter  Medications   hydroxychloroquine (PLAQUENIL) 200 MG tablet    Sig: TAKE 1 TABLET BY MOUTH 2 TIMES DAILY MONDAY through FRIDAY ONLY     Dispense:  120 tablet    Refill:  0      Follow-Up Instructions: Return in about 3 months (around 02/02/2022) for Rheumatoid arthritis, DDD.   Gearldine Bienenstock, PA-C  Note - This record has been created using Dragon software.  Chart creation errors have been sought, but may not always  have been located. Such creation errors do not reflect on  the standard of medical care.

## 2021-11-03 MED ORDER — METHOTREXATE SODIUM CHEMO INJECTION (PF) 50 MG/2ML: INTRAMUSCULAR | 2 refills | Status: DC

## 2021-11-03 NOTE — Telephone Encounter (Signed)
Next Visit: 02/09/2022  Last Visit: 11/02/2021  Last Fill: 09/07/2021  DX: Rheumatoid arthritis involving multiple sites with positive rheumatoid factor   Current Dose per office note 11/02/2021: methotrexate 0.6 mL sq injections once weekly Per lab note on 07/19/2021: decrease methotrexate to 0.3 mL subcu weekly.   Labs: 11/02/2021 [facility] 33.8, CMP still pending.  Okay to refill MTX? Which dose?

## 2021-11-03 NOTE — Telephone Encounter (Signed)
Please see lab note and change MTX dose to 0.5 ml sq injections once weekly.

## 2021-11-03 NOTE — Progress Notes (Signed)
CBC WNL. Creatinine is borderline elevated-1.02 and GFR is slightly low-58.  The patient has remained on MTX 0.6 ml sq injections once weekly although she was previously advised to reduce MTX to 0.3.  I would recommend reducing MTX to 0.5 ml sq injections once weekly.  Please change prescription accordingly.  BMP with GFR should be repeated in 1 month.

## 2021-11-04 ENCOUNTER — Telehealth: Payer: Self-pay | Admitting: *Deleted

## 2021-11-04 DIAGNOSIS — Z79899 Other long term (current) drug therapy: Secondary | ICD-10-CM

## 2021-11-04 NOTE — Telephone Encounter (Signed)
-----   Message from Gearldine Bienenstock, PA-C sent at 11/03/2021 12:02 PM EST ----- CBC WNL. Creatinine is borderline elevated-1.02 and GFR is slightly low-58.  The patient has remained on MTX 0.6 ml sq injections once weekly although she was previously advised to reduce MTX to 0.3.  I would recommend reducing MTX to 0.5 ml sq injec tions once weekly.  Please change prescription accordingly.  BMP with GFR should be repeated in 1 month.

## 2021-11-07 LAB — CBC WITH DIFFERENTIAL/PLATELET
Absolute Monocytes: 422 cells/uL (ref 200–950)
Basophils Absolute: 68 cells/uL (ref 0–200)
Basophils Relative: 1.1 %
Eosinophils Absolute: 37 cells/uL (ref 15–500)
Eosinophils Relative: 0.6 %
HCT: 40.2 % (ref 35.0–45.0)
Hemoglobin: 13.6 g/dL (ref 11.7–15.5)
Lymphs Abs: 1277 cells/uL (ref 850–3900)
MCH: 33.8 pg — ABNORMAL HIGH (ref 27.0–33.0)
MCHC: 33.8 g/dL (ref 32.0–36.0)
MCV: 100 fL (ref 80.0–100.0)
MPV: 11.8 fL (ref 7.5–12.5)
Monocytes Relative: 6.8 %
Neutro Abs: 4396 cells/uL (ref 1500–7800)
Neutrophils Relative %: 70.9 %
Platelets: 220 10*3/uL (ref 140–400)
RBC: 4.02 10*6/uL (ref 3.80–5.10)
RDW: 13.2 % (ref 11.0–15.0)
Total Lymphocyte: 20.6 %
WBC: 6.2 10*3/uL (ref 3.8–10.8)

## 2021-11-07 LAB — COMPLETE METABOLIC PANEL WITH GFR
AG Ratio: 1.7 (calc) (ref 1.0–2.5)
ALT: 17 U/L (ref 6–29)
AST: 19 U/L (ref 10–35)
Albumin: 4 g/dL (ref 3.6–5.1)
Alkaline phosphatase (APISO): 47 U/L (ref 37–153)
BUN/Creatinine Ratio: 17 (calc) (ref 6–22)
BUN: 17 mg/dL (ref 7–25)
CO2: 27 mmol/L (ref 20–32)
Calcium: 9.6 mg/dL (ref 8.6–10.4)
Chloride: 105 mmol/L (ref 98–110)
Creat: 1.02 mg/dL — ABNORMAL HIGH (ref 0.60–1.00)
Globulin: 2.4 g/dL (calc) (ref 1.9–3.7)
Glucose, Bld: 84 mg/dL (ref 65–99)
Potassium: 4.7 mmol/L (ref 3.5–5.3)
Sodium: 143 mmol/L (ref 135–146)
Total Bilirubin: 0.4 mg/dL (ref 0.2–1.2)
Total Protein: 6.4 g/dL (ref 6.1–8.1)
eGFR: 58 mL/min/{1.73_m2} — ABNORMAL LOW (ref >=60)

## 2021-11-07 LAB — PROTEIN ELECTROPHORESIS, SERUM, WITH REFLEX
Albumin ELP: 3.9 g/dL (ref 3.8–4.8)
Alpha 1: 0.3 g/dL (ref 0.2–0.3)
Alpha 2: 0.7 g/dL (ref 0.5–0.9)
Beta 2: 0.3 g/dL (ref 0.2–0.5)
Beta Globulin: 0.4 g/dL (ref 0.4–0.6)
Gamma Globulin: 0.9 g/dL (ref 0.8–1.7)
Total Protein: 6.5 g/dL (ref 6.1–8.1)

## 2021-11-08 ENCOUNTER — Ambulatory Visit (HOSPITAL_BASED_OUTPATIENT_CLINIC_OR_DEPARTMENT_OTHER): Admission: RE | Admit: 2021-11-08 | Payer: BC Managed Care – PPO | Source: Home / Self Care | Admitting: Surgery

## 2021-11-08 SURGERY — MINOR EXCISION OF MASS
Anesthesia: LOCAL

## 2021-11-08 NOTE — Progress Notes (Signed)
SPEP did not reveal any abnormal proteins.

## 2021-12-06 ENCOUNTER — Other Ambulatory Visit: Payer: Self-pay | Admitting: Physician Assistant

## 2021-12-14 IMAGING — CT CT HEAD W/O CM
4 series · 15 of 47 positions shown, 17 images · non-contrast
Comparison: 07/25/2021

CLINICAL DATA: History of weakness and fatigue for several days

EXAM:
CT HEAD WITHOUT CONTRAST
CT CERVICAL SPINE WITHOUT CONTRAST
TECHNIQUE: Multidetector CT imaging of the head and cervical spine was
performed following the standard protocol without intravenous
contrast. Multiplanar CT image reconstructions of the cervical spine
were also generated.

[Series 3: head without · axial · non-contrast · 0.40mm/px · z∈[-166,-46]mm · 7 of 32 slices shown, 9 images]
[im 4/32  brain]
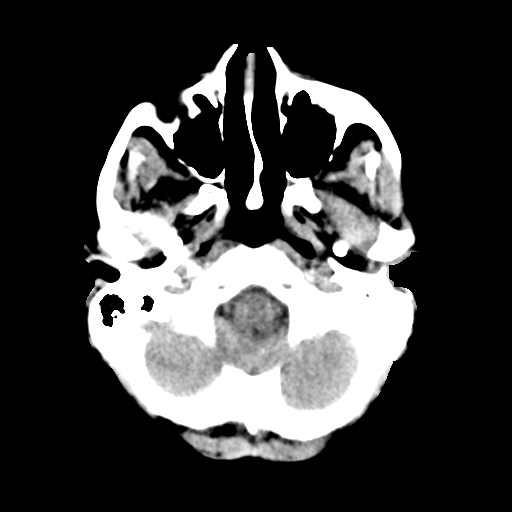
[im 4/32  bone]
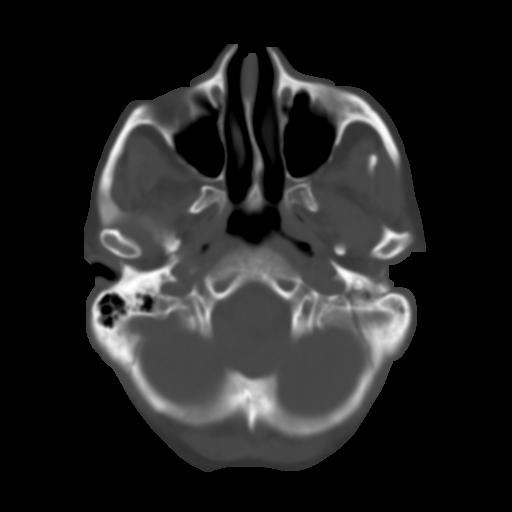
[im 8/32  brain]
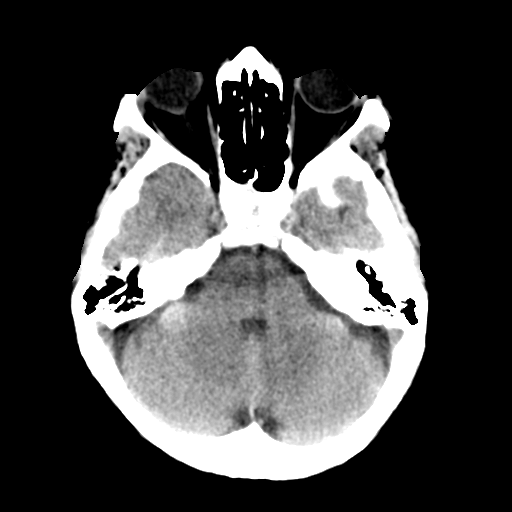
[im 12/32  brain]
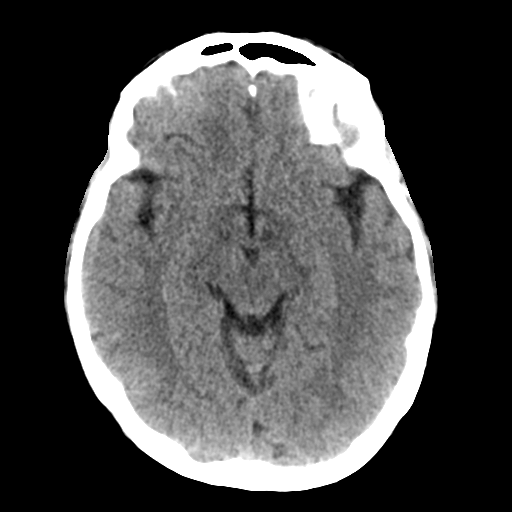
[im 16/32  brain]
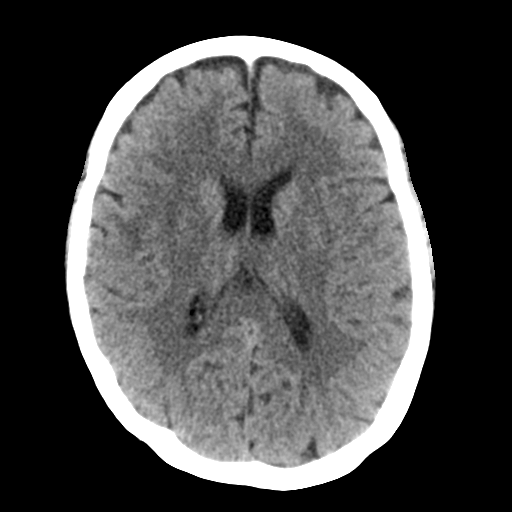
[im 20/32  brain]
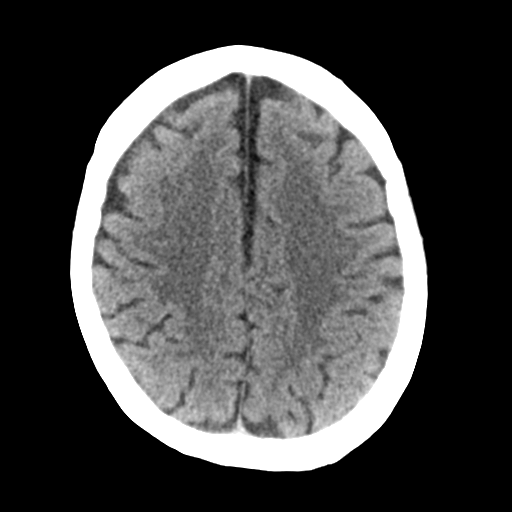
[im 20/32  bone]
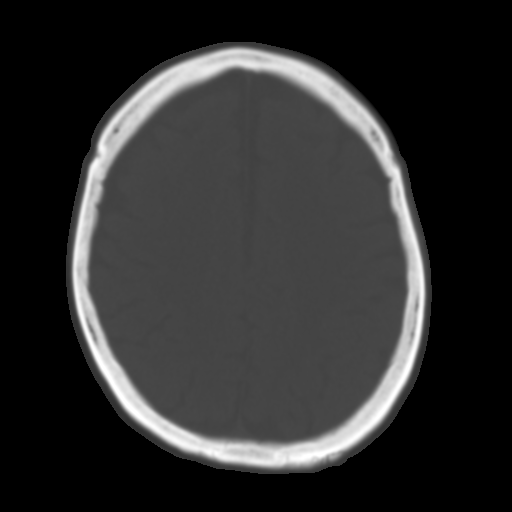
[im 24/32  brain]
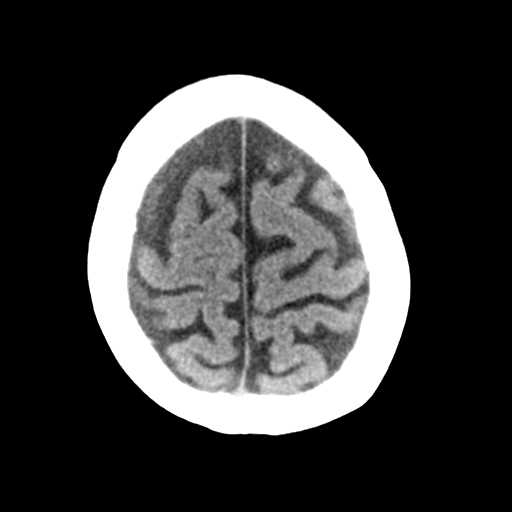
[im 28/32  brain]
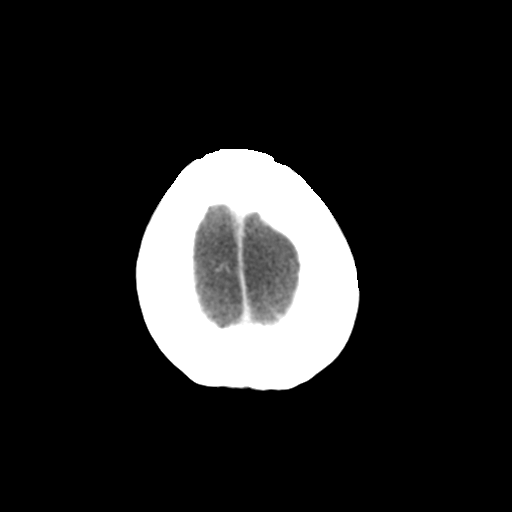

[Series 4: head bone · axial · 0.40mm/px · z∈[-168,-152]mm · 2 of 80 slices shown]
[im 8/80  bone]
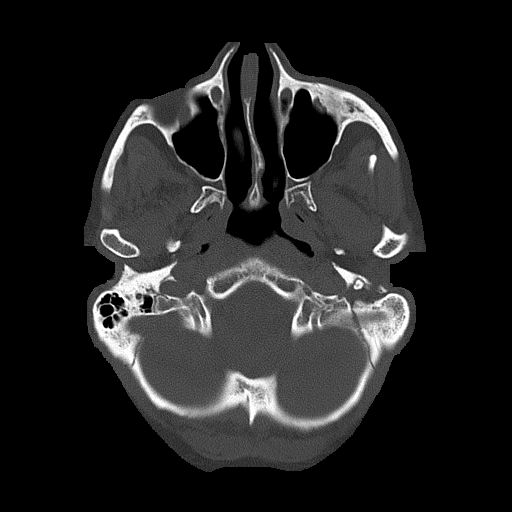
[im 16/80  bone]
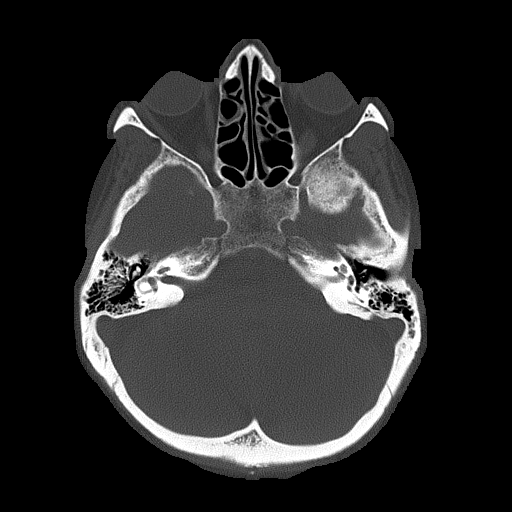

[Series 5: head without cor · coronal · non-contrast · 0.29mm/px · 3 of 67 slices shown]
[im 23/67  brain]
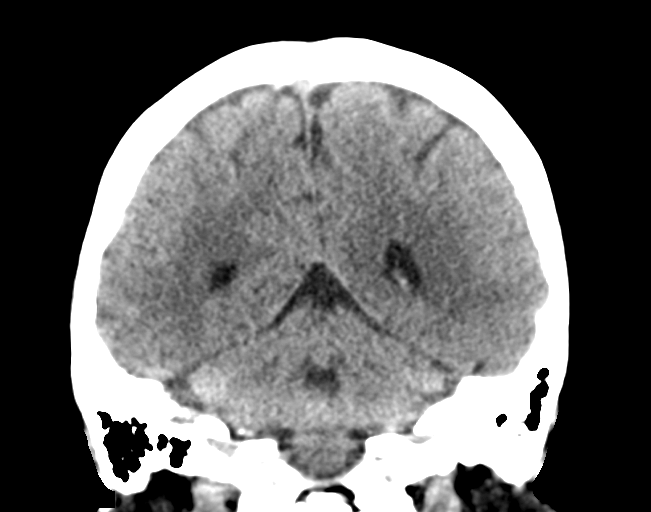
[im 30/67  brain]
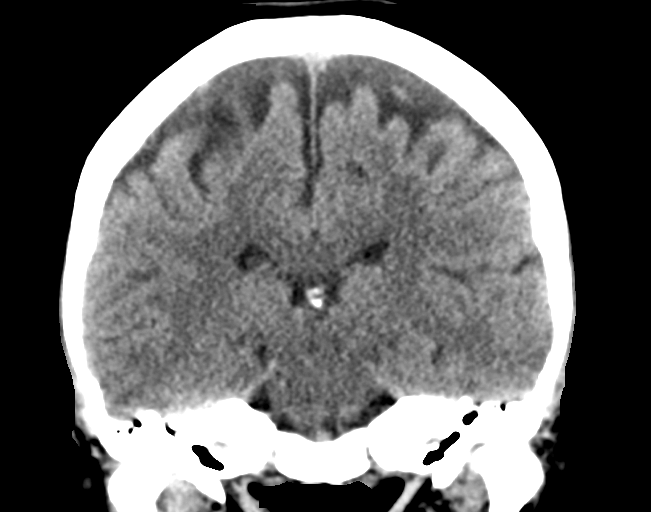
[im 37/67  brain]
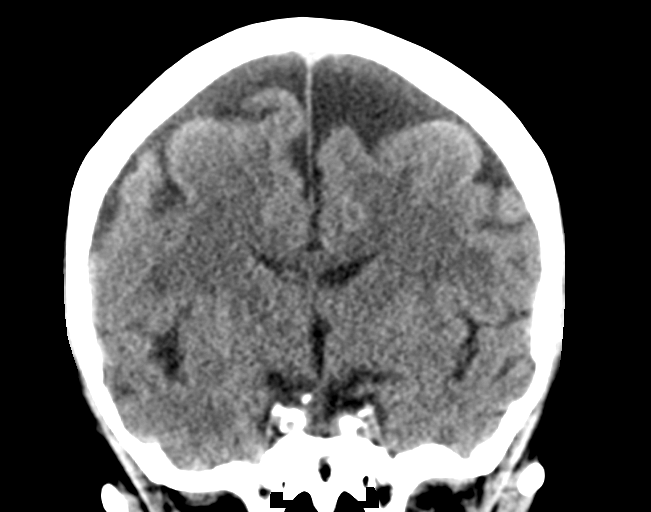

[Series 6: head without sag · sagittal · non-contrast · 0.31mm/px · 3 of 67 slices shown]
[im 23/67  brain]
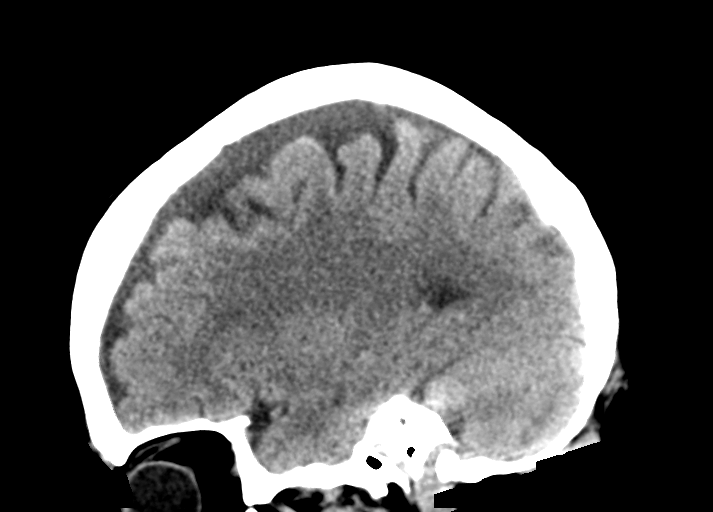
[im 34/67  brain]
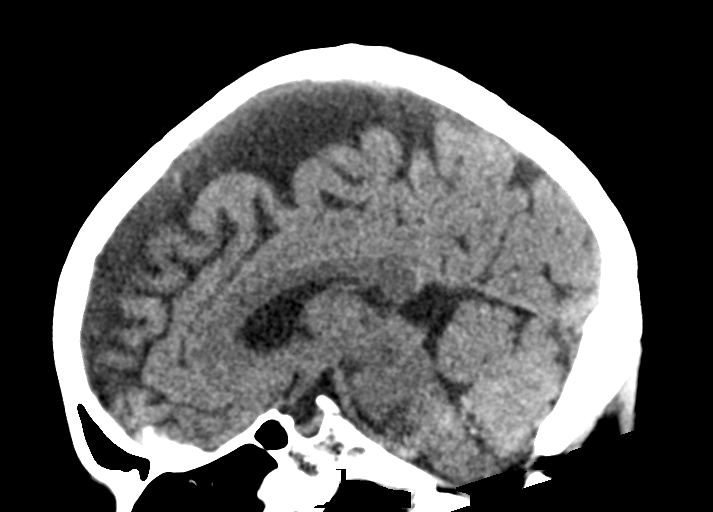
[im 45/67  brain]
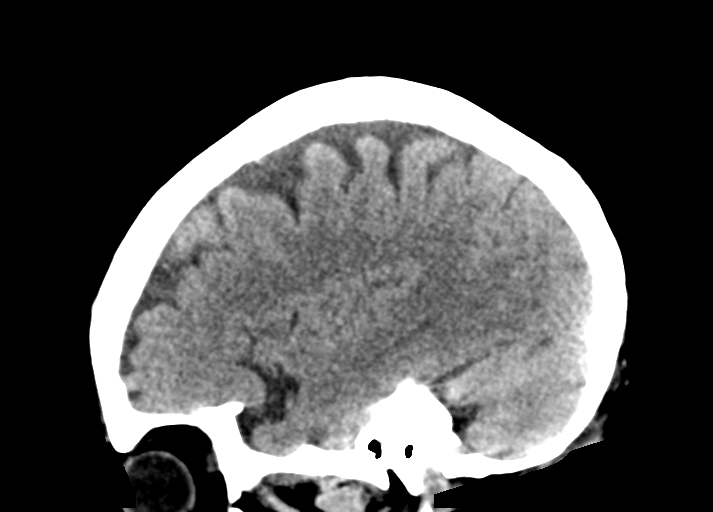

[15 of 47 positions shown; findings below may reference images not displayed]

FINDINGS: CT HEAD FINDINGS

Brain: No evidence of acute infarction, hemorrhage, hydrocephalus,
extra-axial collection or mass lesion/mass effect. Chronic atrophic
changes are noted with prominence of the CSF spaces along the
convexities stable in appearance from the prior exam.

Vascular: No hyperdense vessel or unexpected calcification.

Skull: Normal. Negative for fracture or focal lesion.

Sinuses/Orbits: No acute finding.

Other: None.

CT CERVICAL SPINE FINDINGS

Alignment: Mild straightening of the normal cervical lordosis is
noted. Mild anterolisthesis of C3 on C4 and C4 on C5 is noted of a
degenerative nature.

Skull base and vertebrae: 7 cervical segments are well visualized.
Vertebral body height is well maintained. Disc space narrowing is
noted at C5-6 and to a lesser degree at C6-7 with associated
osteophytic changes. Facet hypertrophic changes are noted with mild
anterolisthesis as described above. No acute fracture or acute facet
abnormality is noted.

Soft tissues and spinal canal: Surrounding soft tissue structures
are within normal limits

Upper chest: Visualized lung apices are unremarkable.

Other: None
IMPRESSION: CT of the head: Chronic atrophic changes stable from the prior exam.

CT of the cervical spine: Multilevel degenerative change without
acute abnormality.

## 2021-12-30 NOTE — Progress Notes (Signed)
Sent message, via epic in basket, requesting orders in epic from surgeon.  

## 2022-01-02 NOTE — Patient Instructions (Signed)
DUE TO COVID-19 ONLY ONE VISITOR IS ALLOWED TO COME WITH YOU AND STAY IN THE WAITING ROOM ONLY DURING PRE OP AND PROCEDURE DAY OF SURGERY IF YOU ARE GOING HOME AFTER SURGERY. IF YOU ARE SPENDING THE NIGHT 2 PEOPLE MAY VISIT WITH YOU IN YOUR PRIVATE ROOM AFTER SURGERY UNTIL VISITING  HOURS ARE OVER AT 800 PM AND 1  VISITOR  MAY  SPEND THE NIGHT.                 MAKENIZE FALETTI     Your procedure is scheduled on: 01/20/22   Report to [facility] Main  Entrance   Report to admitting at  7:15 AM     Call this number if you have problems the morning of surgery [phone]    Remember: Do not eat food or drink :After Midnight the night before your surgery,        BRUSH YOUR TEETH MORNING OF SURGERY AND RINSE YOUR MOUTH OUT, NO CHEWING GUM CANDY OR MINTS.     Take these medicines the morning of surgery with A SIP OF WATER: Zoloft, Metoprolol, Levothyroxine                                You may not have any metal on your body including hair pins and              piercings  Do not wear jewelry, make-up, lotions, powders or perfumes, deodorant             Do not wear nail polish on your fingernails.  Do not shave  48 hours prior to surgery.                 Do not bring valuables to the hospital. [place].  Contacts, dentures or bridgework may not be worn into surgery..     Patients discharged the day of surgery will not be allowed to drive home.  IF YOU ARE HAVING SURGERY AND GOING HOME THE SAME DAY, YOU MUST HAVE AN ADULT TO DRIVE YOU HOME AND BE WITH YOU FOR 24 HOURS. YOU MAY GO HOME BY TAXI OR UBER OR ORTHERWISE, BUT AN ADULT MUST ACCOMPANY YOU HOME AND STAY WITH YOU FOR 24 HOURS.  Name and phone number of your driver:  Special Instructions: N/A              Please read over the following fact sheets you were given: _____________________________________________________________________             [facility] - Preparing  for Surgery Before surgery, you can play an important role.  Because skin is not sterile, your skin needs to be as free of germs as possible.  You can reduce the number of germs on your skin by washing with CHG (chlorahexidine gluconate) soap before surgery.  CHG is an antiseptic cleaner which kills germs and bonds with the skin to continue killing germs even after washing. Please DO NOT use if you have an allergy to CHG or antibacterial soaps.  If your skin becomes reddened/irritated stop using the CHG and inform your nurse when you arrive at Short Stay. Do not shave (including legs and underarms) for at least 48 hours prior to the first CHG shower.   Please follow these instructions carefully:  1.  Shower with CHG Soap the night before surgery and the  morning of Surgery.  2.  If you choose to wash your hair, wash your hair first as usual with your  normal  shampoo.  3.  After you shampoo, rinse your hair and body thoroughly to remove the  shampoo.                            4.  Use CHG as you would any other liquid soap.  You can apply chg directly  to the skin and wash                       Gently with a scrungie or clean washcloth.  5.  Apply the CHG Soap to your body ONLY FROM THE NECK DOWN.   Do not use on face/ open                           Wound or open sores. Avoid contact with eyes, ears mouth and genitals (private parts).                       Wash face,  Genitals (private parts) with your normal soap.             6.  Wash thoroughly, paying special attention to the area where your surgery  will be performed.  7.  Thoroughly rinse your body with warm water from the neck down.  8.  DO NOT shower/wash with your normal soap after using and rinsing off  the CHG Soap.                9.  Pat yourself dry with a clean towel.            10.  Wear clean pajamas.            11.  Place clean sheets on your bed the night of your first shower and do not  sleep with pets. Day of Surgery : Do not  apply any lotions/deodorants the morning of surgery.  Please wear clean clothes to the hospital/surgery center.  FAILURE TO FOLLOW THESE INSTRUCTIONS MAY RESULT IN THE CANCELLATION OF YOUR SURGERY PATIENT SIGNATURE_________________________________  NURSE SIGNATURE__________________________________  ________________________________________________________________________

## 2022-01-03 ENCOUNTER — Encounter (HOSPITAL_COMMUNITY)
Admission: RE | Admit: 2022-01-03 | Discharge: 2022-01-03 | Disposition: A | Discharge status: Home / Self Care | Payer: BC Managed Care – PPO | Source: Ambulatory Visit | Attending: Anesthesiology | Admitting: Anesthesiology

## 2022-01-03 DIAGNOSIS — I10 Essential (primary) hypertension: Secondary | ICD-10-CM

## 2022-01-03 NOTE — Progress Notes (Signed)
Pt missed her PAT visit appointment. I called her contact number and let a message.

## 2022-01-09 ENCOUNTER — Other Ambulatory Visit: Payer: Self-pay | Admitting: Physician Assistant

## 2022-01-09 DIAGNOSIS — M0579 Rheumatoid arthritis with rheumatoid factor of multiple sites without organ or systems involvement: Secondary | ICD-10-CM

## 2022-01-09 NOTE — Telephone Encounter (Signed)
Next Visit: 02/09/2022   Last Visit: 11/02/2021   Last Fill: 11/02/2021  DX: Rheumatoid arthritis involving multiple sites with positive rheumatoid factor    Current Dose per office note 11/02/2021: Plaquenil 200 mg 1 tablet by mouth BID M-F  Labs: 11/02/2021 CBC WNL. Creatinine is borderline elevated-1.02 and GFR is slightly low-58.   PLQ eye exam:06/16/2021  Okay to refill PLQ?

## 2022-01-10 NOTE — Progress Notes (Signed)
COVID swab appointment: N/A  COVID Vaccine Completed: Yes x2 Date COVID Vaccine completed: 02-22-20 04-07-20 Has received booster: COVID vaccine manufacturer: Pfizer      Date of COVID positive in last 90 days:  PCP - Vanessa Carney, MD Cardiologist -   Chest x-ray - 07-25-21 Epic EKG - 08-01-21 Epic Stress Test - greater than 2 years Epic ECHO -  Cardiac Cath -  Pacemaker/ICD device last checked: Spinal Cord Stimulator:  Sleep Study -  CPAP -   Fasting Blood Sugar -  Checks Blood Sugar _____ times a day  Blood Thinner Instructions: Aspirin Instructions: Last Dose:  Activity level:  Can go up a flight of stairs and perform activities of daily living without stopping and without symptoms of chest pain or shortness of breath.   Able to exercise without symptoms  Unable to go up a flight of stairs without symptoms of      Anesthesia review: CAD, hx of MI, murmur, HTN  Patient denies shortness of breath, fever, cough and chest pain at PAT appointment   Patient verbalized understanding of instructions that were given to them at the PAT appointment. Patient was also instructed that they will need to review over the PAT instructions again at home before surgery.

## 2022-01-10 NOTE — Patient Instructions (Signed)
DUE TO COVID-19 ONLY ONE VISITOR IS ALLOWED TO COME WITH YOU AND STAY IN THE WAITING ROOM ONLY DURING PRE OP AND PROCEDURE.   **NO VISITORS ARE ALLOWED IN THE SHORT STAY AREA OR RECOVERY ROOM!!**   You are not required to quarantine, however you are required to wear a well-fitted mask when you are out and around people not in your household.  Hand Hygiene often Do NOT share personal items Notify your provider if you are in close contact with someone who has COVID or you develop fever 100.4 or greater, new onset of sneezing, cough, sore throat, shortness of breath or body aches.       Your procedure is scheduled on: Friday, 01-20-22   Report to [facility] Main  Entrance     Report to admitting at 7:15 AM   Call this number if you have problems the morning of surgery [phone]   Do not eat food :After Midnight.   May have liquids until 6:30 AM  day of surgery  CLEAR LIQUID DIET  Foods Allowed                                                                     Foods Excluded  Water, Black Coffee (no milk/no creamer) and tea, regular and decaf                              liquids that you cannot  Plain Jell-O in any flavor  (No red)                         see through such as: Fruit ices (not with fruit pulp)                                 milk, soups, orange juice  Iced Popsicles (No red)                                    All solid food                             Apple juices Sports drinks like Gatorade (No red) Lightly seasoned clear broth or consume(fat free) Sugar    Complete one Ensure drink the morning of surgery at  6:30 AM the day of surgery.       The day of surgery:  Drink ONE (1) Pre-Surgery Clear Ensure the morning of surgery. Drink in one sitting. Do not sip.  This drink was given to you during your hospital  pre-op appointment visit. Nothing else to drink after completing the Pre-Surgery Clear Ensure          If you have questions, please contact  your surgeons office.     Oral Hygiene is also important to reduce your risk of infection.  Remember - BRUSH YOUR TEETH THE MORNING OF SURGERY WITH YOUR REGULAR TOOTHPASTE   Do NOT smoke after Midnight   Take these medicines the morning of surgery with A SIP OF WATER: Alprazolam, Pepcid, Levothyroxine, Metoprolol, Sertraline   Stop all vitamins and herbal supplements a week before surgery             You may not have any metal on your body including hair pins, jewelry, and body piercing             Do not wear make-up, lotions, powders, perfumes or deodorant  Do not wear nail polish including gel and S&S, artificial/acrylic nails, or any other type of covering on natural nails including finger and toenails. If you have artificial nails, gel coating, etc. that needs to be removed by a nail salon please have this removed prior to surgery or surgery may need to be canceled/ delayed if the surgeon/ anesthesia feels like they are unable to be safely monitored.   Do not shave  48 hours prior to surgery.          Do not bring valuables to the hospital. [facility] IS NOT RESPONSIBLE FOR VALUABLES.   Contacts, dentures or bridgework may not be worn into surgery.     Patients discharged the day of surgery will not be allowed to drive home.   A responsible adult must remain with you for 24 hours after surgery.  Special Instructions: Bring a copy of your healthcare power of attorney and living will documents the day of surgery if you haven't scanned them in before.  Please read over the following fact sheets you were given: IF YOU HAVE QUESTIONS ABOUT YOUR PRE OP INSTRUCTIONS PLEASE CALL [phone]   [facility] - Preparing for Surgery Before surgery, you can play an important role.  Because skin is not sterile, your skin needs to be as free of germs as possible.  You can reduce the number of germs on your skin by washing with CHG (chlorahexidine gluconate)  soap before surgery.  CHG is an antiseptic cleaner which kills germs and bonds with the skin to continue killing germs even after washing. Please DO NOT use if you have an allergy to CHG or antibacterial soaps.  If your skin becomes reddened/irritated stop using the CHG and inform your nurse when you arrive at Short Stay. Do not shave (including legs and underarms) for at least 48 hours prior to the first CHG shower.  You may shave your face/neck.  Please follow these instructions carefully:  1.  Shower with CHG Soap the night before surgery and the  morning of surgery.  2.  If you choose to wash your hair, wash your hair first as usual with your normal  shampoo.  3.  After you shampoo, rinse your hair and body thoroughly to remove the shampoo.                             4.  Use CHG as you would any other liquid soap.  You can apply chg directly to the skin and wash.  Gently with a scrungie or clean washcloth.  5.  Apply the CHG Soap to your body ONLY FROM THE NECK DOWN.   Do   not use on face/ open                           Wound  or open sores. Avoid contact with eyes, ears mouth and   genitals (private parts).                       Wash face,  Genitals (private parts) with your normal soap.             6.  Wash thoroughly, paying special attention to the area where your    surgery  will be performed.  7.  Thoroughly rinse your body with warm water from the neck down.  8.  DO NOT shower/wash with your normal soap after using and rinsing off the CHG Soap.                9.  Pat yourself dry with a clean towel.            10.  Wear clean pajamas.            11.  Place clean sheets on your bed the night of your first shower and do not  sleep with pets. Day of Surgery : Do not apply any lotions/deodorants the morning of surgery.  Please wear clean clothes to the hospital/surgery center.  FAILURE TO FOLLOW THESE INSTRUCTIONS MAY RESULT IN THE CANCELLATION OF YOUR SURGERY  PATIENT  SIGNATURE_________________________________  NURSE SIGNATURE__________________________________  ________________________________________________________________________

## 2022-01-12 ENCOUNTER — Encounter (HOSPITAL_COMMUNITY)
Admission: RE | Admit: 2022-01-12 | Discharge: 2022-01-12 | Disposition: A | Discharge status: Home / Self Care | Payer: PRIVATE HEALTH INSURANCE | Source: Ambulatory Visit | Attending: Family Medicine | Admitting: Family Medicine

## 2022-01-13 ENCOUNTER — Encounter (HOSPITAL_COMMUNITY)
Admission: RE | Admit: 2022-01-13 | Discharge: 2022-01-13 | Disposition: A | Discharge status: Home / Self Care | Payer: PRIVATE HEALTH INSURANCE | Source: Ambulatory Visit | Attending: Family Medicine | Admitting: Family Medicine

## 2022-01-13 NOTE — Progress Notes (Signed)
Patient scheduled for pre surgery phone call, no answer x3.  Left message for patient to return call.

## 2022-01-18 ENCOUNTER — Encounter (HOSPITAL_COMMUNITY): Payer: Self-pay

## 2022-01-18 NOTE — Progress Notes (Signed)
Attempted to obtain medical history via telephone, unable to reach at this time. I left a voicemail to return pre surgical testing department's phone call.  I also sent a message via epic.

## 2022-01-18 NOTE — H&P (Signed)
REFERRING PHYSICIAN: Redmon, Vanessa P, PA  PROVIDER: [name], MD  MRN: [id number] DOB: 08/18/1946 DATE OF ENCOUNTER: 09/14/2021  Subjective   Chief Complaint: Epidermal Cyst   History of Present Illness: Vanessa Collins is a 75 y.o. female who is seen today as an office consultation at the request of Dr. Redmon for evaluation of Epidermal Cyst .   She has had a palpable mass in the midportion of her back between her clavicles for some time. She is followed by Dr. Dean Collins. It appears to be a sebaceous cyst and she wanted it excised since it is over the bones and does bother her from time to time.  Review of Systems: See HPI as well for other ROS.  ROS   Medical History: Past Medical History:  Diagnosis Date   Arthritis   Hypertension   There is no problem list on file for this patient.  Past Surgical History:  Procedure Laterality Date   COLON SURGERY    Allergies  Allergen Reactions   Hydrocodone-Acetaminophen Other (See Comments)  Upset stomach Upset stomach   Aspirin Other (See Comments)  Stomach cramping Stomach cramping   Current Outpatient Medications on File Prior to Visit  Medication Sig Dispense Refill   folic acid (FOLVITE) 1 MG tablet Take 2 tablets by mouth once daily   hydrOXYchloroQUINE (PLAQUENIL) 200 mg tablet Take 1 tablet by mouth twice daily Monday through Friday only.   oxyCODONE-acetaminophen (PERCOCET) 5-325 mg tablet TAKE 1/2 TO 1 TABLET BY MOUTH EVERY DAY as needed for pain   predniSONE (DELTASONE) 5 MG tablet   ALPRAZolam (XANAX) 0.5 MG tablet Take by mouth   BD TUBERCULIN SYRINGE 1 mL 27 x 1/2" Syrg Use to inject methotrexate once weekly.   cetirizine (ZYRTEC) 10 MG tablet Take 10 mg by mouth   cholecalciferol (VITAMIN D3) 5,000 unit capsule Take by mouth   famotidine (PEPCID) 20 MG tablet Take 20 mg by mouth   levothyroxine (SYNTHROID) 25 MCG tablet Take 25 mcg by mouth once daily   methotrexate, PF, 25 mg/mL  chemo injection INJECT 0.6 ML SUBCUTANEOUSLY ONCE WEEKLY, discard remainder OF vial AFTER USE.   metoprolol succinate (TOPROL-XL) 25 MG XL tablet Take 25 mg by mouth   multivitamin with minerals tablet Take 1 tablet by mouth once daily   nitroGLYcerin (NITROSTAT) 0.4 MG SL tablet Place under the tongue at bedtime as needed   potassium chloride (KLOR-CON) 20 MEQ ER tablet TAKE 2 TABLETS BY MOUTH DAILY FOR 7 DAYS   PREMARIN 0.3 mg tablet Take 0.3 mg by mouth once daily   sertraline (ZOLOFT) 50 MG tablet Take by mouth   simvastatin (ZOCOR) 80 MG tablet Take by mouth   traMADoL (ULTRAM) 50 mg tablet Take 100 mg by mouth every 6 (six) hours as needed   No current facility-administered medications on file prior to visit.   Family History  Problem Relation Age of Onset   Obesity Mother   High blood pressure (Hypertension) Mother   Coronary Artery Disease (Blocked arteries around heart) Mother    Social History   Tobacco Use  Smoking Status Former Smoker  Smokeless Tobacco Never Used    Social History   Socioeconomic History   Marital status: Unknown  Tobacco Use   Smoking status: Former Smoker   Smokeless tobacco: Never Used  Substance and Sexual Activity   Alcohol use: Never   Drug use: Never   Objective:   Vitals:  09/14/21 1422  BP: 120/80    Pulse: 51  Temp: 36.5 C (97.7 F)  SpO2: 96%   There is no height or weight on file to calculate BMI.  Physical Exam General: Well developed and maintained white female no acute distress HEENT : She is dealing with vertigo that occurred after her second Pfizer vaccine Chest: On the back between her clavicles there is a punctum and a 1.2cm palpable mass consistent with a sebaceous or epidermoid cyst Heart: Not examined Breast: Not examined Abdomen: Not examined GU not examined Rectal not examined Extremities she does have some broken blood vessels and skin changes on her right leg from a an outbreak of shingles Neuro vertigo  is being worked up. I did not examine her for vertigo today  Labs, Imaging and Diagnostic Testing: None examined  Assessment and Plan:  Diagnoses and all orders for this visit:  Sebaceous cyst    We will plan excision under local at the outpatient setting. I have explained what we would do and she is already read about it on the Internet.  No follow-ups on file.  Alitza Cowman Collins Anterrio Mccleery, MD   

## 2022-01-20 ENCOUNTER — Ambulatory Visit (HOSPITAL_COMMUNITY): Admission: RE | Admit: 2022-01-20 | Payer: BC Managed Care – PPO | Source: Home / Self Care | Admitting: Surgery

## 2022-01-20 ENCOUNTER — Encounter (HOSPITAL_COMMUNITY): Admission: RE | Payer: Self-pay | Source: Home / Self Care

## 2022-01-20 SURGERY — CYST REMOVAL
Anesthesia: General

## 2022-01-27 NOTE — Progress Notes (Signed)
Office Visit Note  Patient: Vanessa Collins             Date of Birth: 1946/01/26           MRN: [id number]             PCP: Vanessa Collins, VanessaMarlou Sa, Collins Referring: Vanessa Collins, Vanessa Collins Visit Date: 02/09/2022 Occupation: @Vanessa Collins @  Subjective:  Joint stiffness   History of Present Illness: Vanessa Collins is a 76 y.o. female with history of seropositive rheumatoid arthritis and DDD.  She is currently on methotrexate 0.5 ml sq injections once weekly, folic acid 2 mg daily, Plaquenil 200 mg 1 tablet by mouth twice daily Monday through Friday, and prednisone 10 mg daily.  She is tolerating these medications without any side effects.  She continues to experience stiffness in her joints lasting into the afternoon.  She has been performing stretching exercises daily as well as performing yoga and Pilates.  She has also been walking on a daily basis for exercise.  She continues to have chronic pain in both hands and her ankle joints. She has not had any recent infections.  She continues to suffer from vertigo.     Activities of Daily Living:  Patient reports morning stiffness for all day. Patient Reports nocturnal pain.  Difficulty dressing/grooming: Reports Difficulty climbing stairs: Denies Difficulty getting out of chair: Denies Difficulty using hands for taps, buttons, cutlery, and/or writing: Reports  Review of Systems  Constitutional:  Positive for fatigue.  HENT:  Negative for mouth sores, mouth dryness and nose dryness.   Eyes:  Negative for pain, itching and dryness.  Respiratory:  Negative for shortness of breath and difficulty breathing.   Cardiovascular:  Negative for chest pain and palpitations.  Gastrointestinal:  Negative for blood in stool, constipation and diarrhea.  Endocrine: Negative for increased urination.  Genitourinary:  Negative for difficulty urinating.  Musculoskeletal:  Positive for joint pain, joint pain, joint swelling, myalgias, morning stiffness, muscle tenderness and  myalgias.  Skin:  Negative for color change, rash and redness.  Allergic/Immunologic: Negative for susceptible to infections.  Neurological:  Positive for dizziness and weakness. Negative for numbness, headaches and memory loss.  Hematological:  Positive for bruising/bleeding tendency.  Psychiatric/Behavioral:  Negative for confusion.    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 10/09/2018 10/09/2018   History of depression 06/19/2017   Rheumatoid arthritis involving multiple sites with positive rheumatoid factor ([place]) 01/25/2017   High risk medication use 01/25/2017   Immunosuppression ([place]) 01/25/2017   Fibromyalgia 01/25/2017   Primary osteoarthritis of both hands 01/25/2017   Spondylosis of lumbar region without myelopathy or radiculopathy 01/25/2017   DJD (degenerative joint disease), cervical 01/25/2017   History of neutropenia 01/25/2017   History of coronary artery disease 01/25/2017   Lapband APS April 2010 02/25/2013   Wears dentures     Past Medical History:  Diagnosis Date   Arthritis    rheumatoid , osteo    CAD (coronary artery disease)    PCI to LAD 2010   Diverticulitis    with perforation and colostomy placement     Fibromyalgia    Heart disease    Heart murmur    Hyperlipidemia    Hypertension  Myocardial infarction [facility]) 2008   one Stent   Shingles 03/2020   per patient    Vertigo    per patient    Vitamin D deficiency    Wears dentures    gum disease    Family History  Problem Relation Age of Onset   Macular degeneration Mother    Cancer Father    Rheum arthritis Father    Bipolar disorder Father    Bipolar disorder Daughter    Rheum arthritis Daughter    Past  Surgical History:  Procedure Laterality Date   CESAREAN SECTION     COLECTOMY WITH COLOSTOMY CREATION/HARTMANN PROCEDURE  09/2017   Perforated diverticulitis   CORONARY STENT PLACEMENT  08/2009   EYE SURGERY     LAPAROSCOPIC GASTRIC BANDING  04/05/2009   Dr Hassell Done   PROCTOSCOPY N/A 10/09/2018   Procedure: RIDGID PROCTOSCOPY;  Surgeon: Michael Boston, Collins;  Location: [facility];  Service: General;  Laterality: N/A;   VAGINAL HYSTERECTOMY     VENTRAL HERNIA REPAIR N/A 07/11/2019   Procedure: LAPAROSCOPIC ASSISTED VENTRAL HERNIA REPAIR;  Surgeon: Johnathan Hausen, Collins;  Location: [facility];  Service: General;  Laterality: N/A;   Social History   Social History Narrative   Not on file   Immunization History  Administered Date(s) Administered   PFIZER(Purple Top)SARS-COV-2 Vaccination 02/22/2020, 04/07/2020     Objective: Vital Signs: BP 108/71 (BP Location: Left Arm, Patient Position: Sitting, Cuff Size: Normal)    Pulse 71    Ht 5\' 3"  (1.6 m)    Wt 142 lb 12.8 oz (64.8 kg)    BMI 25.30 kg/m    Physical Exam Vitals and nursing note reviewed.  Constitutional:      Appearance: She is well-developed.  HENT:     Head: Normocephalic and atraumatic.  Eyes:     Conjunctiva/sclera: Conjunctivae normal.  Cardiovascular:     Rate and Rhythm: Normal rate and regular rhythm.     Heart sounds: Normal heart sounds.  Pulmonary:     Effort: Pulmonary effort is normal.     Breath sounds: Normal breath sounds.  Abdominal:     General: Bowel sounds are normal.     Palpations: Abdomen is soft.  Musculoskeletal:     Cervical back: Normal range of motion.  Skin:    General: Skin is warm and dry.     Capillary Refill: Capillary refill takes less than 2 seconds.  Neurological:     Mental Status: She is alert and oriented to person, place, and time.  Psychiatric:        Behavior: Behavior normal.     Musculoskeletal Exam: C-spine has limited range of motion with lateral rotation.  No midline spinal  tenderness.  Shoulder joints, elbow joints, wrist joints, MCPs have good range of motion.  Tenderness over the right fourth and fifth PIP joints.  Mild inflammation in the right fifth PIP joint noted.  Complete fist formation bilaterally.  PIP and DIP thickening consistent with osteoarthritis of both hands.  Synovial thickening over the right second MCP joint.  Hip joints have good range of motion with some discomfort in the left hip.  Knee joints have good range of motion with no warmth or effusion.  Subluxation and thickening of both ankle joints noted.  CDAI Exam: CDAI Score: 4.2  Patient Global: 7 mm; Provider Global: 5 mm Swollen: 1 ; Tender: 4  Joint Exam 02/09/2022      Right  Left  PIP 4   Tender  PIP 5  Swollen Tender     Ankle   Tender   Tender     Investigation: No additional findings.  Imaging: No results found.  Recent Labs: Lab Results  Component Value Date   WBC 6.2 11/02/2021   HGB 13.6 11/02/2021   PLT 220 11/02/2021   NA 143 11/02/2021   K 4.7 11/02/2021   CL 105 11/02/2021   CO2 27 11/02/2021   GLUCOSE 84 11/02/2021   BUN 17 11/02/2021   CREATININE 1.02 (H) 11/02/2021   BILITOT 0.4 11/02/2021   ALKPHOS 121 07/29/2021   AST 19 11/02/2021   ALT 17 11/02/2021   PROT 6.4 11/02/2021   PROT 6.5 11/02/2021   ALBUMIN 2.1 (L) 07/29/2021   CALCIUM 9.6 11/02/2021   GFRAA 72 04/26/2021    Speciality Comments: PLQ eye exam:06/16/2021. Dr. Laurence Aly. Follow up in 1 year.   Prior therapy includes: Morrie Sheldon (GI perforation), Humira (inadequate response), Enbrel (inadequate response), and Orencia (nausea and headaches)  Procedures:  No procedures performed Allergies: Vicodin [hydrocodone-acetaminophen] and Aspirin   Assessment / Plan:     Visit Diagnoses: Rheumatoid arthritis involving multiple sites with positive rheumatoid factor ([place]) - She has some tenderness and synovitis of the right fifth PIP joint.  No tenderness or synovitis over MCP joints.   Complete fist formation bilaterally.  Overall her rheumatoid arthritis is stable on the current treatment regimen: Methotrexate 0.5 mL sq injections once weekly, Plaquenil 200 mg 1 tablet by mouth twice daily Monday through Friday, and prednisone 10 mg daily.  She is tolerating these medications without any side effects and has not missed any doses recently.  She tries to remain active stretching on a daily basis and walking daily.  No medication changes will be made at this time.  Hydroxychloroquine blood level will be checked today.  She was advised to notify us if she develops increased joint pain or joint swelling.  She will follow-up in the office in 3 months.  Plan: Hydroxychloroquine, Blood  High risk medication use - Methotrexate 0.5 ml sq inj once weekly, folic acid 1 mg daily, Plaquenil 200 mg 1 tablet by mouth BID M-F, prednisone 10 mg by mouth daily. PLQ eye exam: 06/16/2021. Dr. Laurence Aly. Follow up in 1 year.  CBC, CMP, and hydroxychloroquine blood level will be updated today.  Her next lab work will be due in May and every 3 months to monitor for toxicity. - Plan: CBC with Differential/Platelet, COMPLETE METABOLIC PANEL WITH GFR, Hydroxychloroquine, Blood Discussed the importance of holding methotrexate if she develops signs or symptoms of infection and to resume once the infection has completely cleared.  Current chronic use of systemic steroids - Prednisone 10 mg by mouth daily.  She is aware of the risks of long term prednisone use.   Primary osteoarthritis of both hands: She has PIP and DIP thickening consistent with osteoarthritis of both hands.  She has tenderness palpation over the right fourth and fifth PIP joints.  Discussed the importance of joint protection and muscle strengthening.  DDD (degenerative disc disease), cervical: C-spine has limited range of motion with lateral rotation.  No symptoms of radiculopathy at this time.  DDD (degenerative disc disease), lumbar: She is  not experiencing any increased discomfort in her lower back at this time.  Vitamin D deficiency: She is taking vitamin D 5000 units daily.   Fibromyalgia -She experiences intermittent myalgias and muscle tenderness due to fibromyalgia.  She continues to take tramadol 50 mg 2 tablets  4 times daily and Percocet as needed for pain relief.  Other fatigue: Stable.   Screening for osteoporosis - DEXA updated on 08/09/21: LFN BMD 0.858 with T-score 0.1. Recommended repeating DEXA in 2 years.  She is taking vitamin D 5000 units daily.  Other medical conditions are listed as follows:   History of depression  Vertigo  Abnormal SPEP  History of coronary artery disease  History of hyperlipidemia  Status post colostomy ([place])  Lapband APS April 2010  Essential hypertension, benign: Blood pressure was 108/71.    Orders: Orders Placed This Encounter  Procedures   CBC with Differential/Platelet   COMPLETE METABOLIC PANEL WITH GFR   Hydroxychloroquine, Blood   No orders of the defined types were placed in this encounter.    Follow-Up Instructions: Return in about 3 months (around 05/09/2022) for Rheumatoid arthritis, DDD.   Ofilia Neas, PA-C  Note - This record has been created using Dragon software.  Chart creation errors have been sought, but may not always  have been located. Such creation errors do not reflect on  the standard of medical care.

## 2022-02-08 ENCOUNTER — Other Ambulatory Visit: Payer: Self-pay | Admitting: Physician Assistant

## 2022-02-08 ENCOUNTER — Other Ambulatory Visit: Payer: Self-pay | Admitting: Rheumatology

## 2022-02-08 NOTE — Telephone Encounter (Signed)
Next Visit: 02/09/2022   Last Visit: 11/02/2021   Last Fill: 10/05/2021  DX: Rheumatoid arthritis involving multiple sites with positive rheumatoid factor    Current Dose per office note 11/02/2021: prednisone 7.5-10 mg p.o. daily.  Okay to refill Prednisone?

## 2022-02-08 NOTE — Telephone Encounter (Signed)
Next Visit: 02/09/2022   Last Visit: 11/02/2021   Last Fill: 09/07/2021   DX: Rheumatoid arthritis involving multiple sites with positive rheumatoid factor    Current Dose per lab note 11/02/2022: reducing MTX to 0.5 ml sq injections once weekly  Labs: 11/02/2021 CBC WNL. Creatinine is borderline elevated-1.02 and GFR is slightly low-58.  Patient to update labs on 02/09/2022 at her appointment.   Okay to refill MTX?

## 2022-02-09 ENCOUNTER — Ambulatory Visit (INDEPENDENT_AMBULATORY_CARE_PROVIDER_SITE_OTHER): Payer: BC Managed Care – PPO | Admitting: Physician Assistant

## 2022-02-09 ENCOUNTER — Encounter: Payer: Self-pay | Admitting: Physician Assistant

## 2022-02-09 ENCOUNTER — Other Ambulatory Visit: Payer: Self-pay

## 2022-02-09 VITALS — BP 108/71 | HR 71 | Ht 63.0 in | Wt 142.8 lb

## 2022-02-09 DIAGNOSIS — Z8659 Personal history of other mental and behavioral disorders: Secondary | ICD-10-CM

## 2022-02-09 DIAGNOSIS — M19041 Primary osteoarthritis, right hand: Secondary | ICD-10-CM

## 2022-02-09 DIAGNOSIS — M5030 Other cervical disc degeneration, unspecified cervical region: Secondary | ICD-10-CM

## 2022-02-09 DIAGNOSIS — R5383 Other fatigue: Secondary | ICD-10-CM

## 2022-02-09 DIAGNOSIS — Z8639 Personal history of other endocrine, nutritional and metabolic disease: Secondary | ICD-10-CM

## 2022-02-09 DIAGNOSIS — Z7952 Long term (current) use of systemic steroids: Secondary | ICD-10-CM

## 2022-02-09 DIAGNOSIS — M19042 Primary osteoarthritis, left hand: Secondary | ICD-10-CM

## 2022-02-09 DIAGNOSIS — Z79899 Other long term (current) drug therapy: Secondary | ICD-10-CM | POA: Diagnosis not present

## 2022-02-09 DIAGNOSIS — R778 Other specified abnormalities of plasma proteins: Secondary | ICD-10-CM

## 2022-02-09 DIAGNOSIS — Z9884 Bariatric surgery status: Secondary | ICD-10-CM

## 2022-02-09 DIAGNOSIS — M0579 Rheumatoid arthritis with rheumatoid factor of multiple sites without organ or systems involvement: Secondary | ICD-10-CM

## 2022-02-09 DIAGNOSIS — M51369 Other intervertebral disc degeneration, lumbar region without mention of lumbar back pain or lower extremity pain: Secondary | ICD-10-CM

## 2022-02-09 DIAGNOSIS — R42 Dizziness and giddiness: Secondary | ICD-10-CM

## 2022-02-09 DIAGNOSIS — Z8679 Personal history of other diseases of the circulatory system: Secondary | ICD-10-CM

## 2022-02-09 DIAGNOSIS — Z933 Colostomy status: Secondary | ICD-10-CM

## 2022-02-09 DIAGNOSIS — M797 Fibromyalgia: Secondary | ICD-10-CM

## 2022-02-09 DIAGNOSIS — Z13820 Encounter for screening for osteoporosis: Secondary | ICD-10-CM

## 2022-02-09 DIAGNOSIS — I10 Essential (primary) hypertension: Secondary | ICD-10-CM

## 2022-02-09 DIAGNOSIS — E559 Vitamin D deficiency, unspecified: Secondary | ICD-10-CM

## 2022-02-09 DIAGNOSIS — M5136 Other intervertebral disc degeneration, lumbar region: Secondary | ICD-10-CM

## 2022-02-09 NOTE — Patient Instructions (Signed)
Standing Labs °We placed an order today for your standing lab work.  ° °Please have your standing labs drawn in May and every 3 months  ° ° °If possible, please have your labs drawn 2 weeks prior to your appointment so that the provider can discuss your results at your appointment. ° °Please note that you may see your imaging and lab results in MyChart before we have reviewed them. °We may be awaiting multiple results to interpret others before contacting you. °Please allow our office up to 72 hours to thoroughly review all of the results before contacting the office for clarification of your results. ° °We have open lab daily: °Monday through Thursday from 1:30-4:30 PM and Friday from 1:30-4:00 PM °at the office of Dr. Shaili Deveshwar, [facility].   °Please be advised, all patients with office appointments requiring lab work will take precedent over walk-in lab work.  °If possible, please come for your lab work on Monday and Friday afternoons, as you may experience shorter wait times. °The office is located at [address], [address], [place], [place] [address] °No appointment is necessary.   °Labs are drawn by [facility]. Please bring your co-pay at the time of your lab draw.  You may receive a bill from [facility] for your lab work. ° °Please note if you are on Hydroxychloroquine and and an order has been placed for a Hydroxychloroquine level, you will need to have it drawn 4 hours or more after your last dose. ° °If you wish to have your labs drawn at another location, please call the office 24 hours in advance to send orders. ° °If you have any questions regarding directions or hours of operation,  °please call [phone].   °As a reminder, please drink plenty of water prior to coming for your lab work. Thanks! ° °

## 2022-02-10 ENCOUNTER — Telehealth: Payer: Self-pay | Admitting: *Deleted

## 2022-02-10 DIAGNOSIS — Z79899 Other long term (current) drug therapy: Secondary | ICD-10-CM

## 2022-02-10 NOTE — Telephone Encounter (Signed)
-----   Message from Gearldine Bienenstock, PA-C sent at 02/10/2022 11:50 AM EST ----- Please clarify how often she is taking celebrex?  She should avoid the use of NSAIDs while taking methotrexate.   Creatinine is elevated-1.12 and GFR is low at 52.   Recheck BMP with GFR in 1 month.   MCV and MCH are borderline elevated.  Please make sure the patient is taking folic acid as prescribed.

## 2022-02-10 NOTE — Progress Notes (Signed)
Please clarify how often she is taking celebrex?  She should avoid the use of NSAIDs while taking methotrexate.   Creatinine is elevated-1.12 and GFR is low at 52.   Recheck BMP with GFR in 1 month.   MCV and MCH are borderline elevated.  Please make sure the patient is taking folic acid as prescribed.

## 2022-02-13 ENCOUNTER — Other Ambulatory Visit: Payer: Self-pay | Admitting: Rheumatology

## 2022-02-13 NOTE — Telephone Encounter (Signed)
Ok to change prescription accordingly and notify the patient of the change.

## 2022-02-13 NOTE — Telephone Encounter (Signed)
Spoke with pharmacy and advised they could switch the prescription to the MTX 2 mL vial with preservatives.   Attempted to contact the patient and left message for patient to call the office.

## 2022-02-15 NOTE — Telephone Encounter (Signed)
Spoke with patient and advised of the change with MTX.

## 2022-02-20 LAB — CBC WITH DIFFERENTIAL/PLATELET
Absolute Monocytes: 569 cells/uL (ref 200–950)
Basophils Absolute: 58 cells/uL (ref 0–200)
Basophils Relative: 0.8 %
Eosinophils Absolute: 43 cells/uL (ref 15–500)
Eosinophils Relative: 0.6 %
HCT: 38.8 % (ref 35.0–45.0)
Hemoglobin: 13 g/dL (ref 11.7–15.5)
Lymphs Abs: 1980 cells/uL (ref 850–3900)
MCH: 33.9 pg — ABNORMAL HIGH (ref 27.0–33.0)
MCHC: 33.5 g/dL (ref 32.0–36.0)
MCV: 101 fL — ABNORMAL HIGH (ref 80.0–100.0)
MPV: 11.7 fL (ref 7.5–12.5)
Monocytes Relative: 7.9 %
Neutro Abs: 4550 cells/uL (ref 1500–7800)
Neutrophils Relative %: 63.2 %
Platelets: 195 10*3/uL (ref 140–400)
RBC: 3.84 10*6/uL (ref 3.80–5.10)
RDW: 13.3 % (ref 11.0–15.0)
Total Lymphocyte: 27.5 %
WBC: 7.2 10*3/uL (ref 3.8–10.8)

## 2022-02-20 LAB — COMPLETE METABOLIC PANEL WITH GFR
AG Ratio: 1.7 (calc) (ref 1.0–2.5)
ALT: 28 U/L (ref 6–29)
AST: 26 U/L (ref 10–35)
Albumin: 4 g/dL (ref 3.6–5.1)
Alkaline phosphatase (APISO): 43 U/L (ref 37–153)
BUN/Creatinine Ratio: 25 (calc) — ABNORMAL HIGH (ref 6–22)
BUN: 28 mg/dL — ABNORMAL HIGH (ref 7–25)
CO2: 32 mmol/L (ref 20–32)
Calcium: 9.3 mg/dL (ref 8.6–10.4)
Chloride: 100 mmol/L (ref 98–110)
Creat: 1.12 mg/dL — ABNORMAL HIGH (ref 0.60–1.00)
Globulin: 2.3 g/dL (calc) (ref 1.9–3.7)
Glucose, Bld: 80 mg/dL (ref 65–99)
Potassium: 4 mmol/L (ref 3.5–5.3)
Sodium: 140 mmol/L (ref 135–146)
Total Bilirubin: 0.6 mg/dL (ref 0.2–1.2)
Total Protein: 6.3 g/dL (ref 6.1–8.1)
eGFR: 52 mL/min/{1.73_m2} — ABNORMAL LOW (ref >=60)

## 2022-02-20 LAB — HYDROXYCHLOROQUINE,BLOOD: HYDROXYCHLOROQUINE, (B): 620 ng/mL — ABNORMAL HIGH

## 2022-02-20 NOTE — Progress Notes (Signed)
Hydroxychloroquine level is within the therapeutic range: 620.  No medication changes recommended at this time.

## 2022-02-23 ENCOUNTER — Other Ambulatory Visit: Payer: Self-pay | Admitting: Physician Assistant

## 2022-02-23 NOTE — Telephone Encounter (Signed)
Next Visit: 05/11/2022 ? ?Last Visit: 02/09/2022 ? ?Last Fill: 04/29/2021 ? ?Dx: Rheumatoid arthritis involving multiple sites with positive rheumatoid factor  ? ?Okay to refill Syringes?   ?

## 2022-03-16 DIAGNOSIS — G894 Chronic pain syndrome: Secondary | ICD-10-CM | POA: Diagnosis not present

## 2022-03-16 DIAGNOSIS — M0579 Rheumatoid arthritis with rheumatoid factor of multiple sites without organ or systems involvement: Secondary | ICD-10-CM | POA: Diagnosis not present

## 2022-03-16 DIAGNOSIS — M150 Primary generalized (osteo)arthritis: Secondary | ICD-10-CM | POA: Diagnosis not present

## 2022-03-16 DIAGNOSIS — Z79891 Long term (current) use of opiate analgesic: Secondary | ICD-10-CM | POA: Diagnosis not present

## 2022-03-23 ENCOUNTER — Other Ambulatory Visit: Payer: Self-pay | Admitting: Rheumatology

## 2022-03-23 DIAGNOSIS — M0579 Rheumatoid arthritis with rheumatoid factor of multiple sites without organ or systems involvement: Secondary | ICD-10-CM

## 2022-03-23 NOTE — Telephone Encounter (Signed)
Next Visit: 05/11/2022 ?  ?Last Visit: 02/09/2022 ?  ?Dose per office note on 02/09/2022: Plaquenil 200 mg 1 tablet by mouth twice daily Monday through Friday ? ?Dx: Rheumatoid arthritis involving multiple sites with positive rheumatoid factor  ? ?Labs:02/09/2022 Creatinine is elevated-1.12 and GFR is low at 52.    ? ?PLQ eye exam:06/16/2021.  ? ?Last Fill: 01/09/2022 ? ?Okay to refill PLQ?  ? ? ?

## 2022-04-27 NOTE — Progress Notes (Deleted)
Office Visit Note  Patient: Vanessa Collins             Date of Birth: 04/11/1946           MRN: [id number]             PCP: Clovis Riley, L.August Saucer, MD Referring: Clovis Riley, Elbert Ewings.August Saucer, MD Visit Date: 05/11/2022 Occupation: @  Subjective:  No chief complaint on file.   History of Present Illness: Vanessa Collins is a 76 y.o. female ***   Activities of Daily Living:  Patient reports morning stiffness for *** {minute/hour:19697}.   Patient {ACTIONS;DENIES/REPORTS:21021675::"Denies"} nocturnal pain.  Difficulty dressing/grooming: {ACTIONS;DENIES/REPORTS:21021675::"Denies"} Difficulty climbing stairs: {ACTIONS;DENIES/REPORTS:21021675::"Denies"} Difficulty getting out of chair: {ACTIONS;DENIES/REPORTS:21021675::"Denies"} Difficulty using hands for taps, buttons, cutlery, and/or writing: {ACTIONS;DENIES/REPORTS:21021675::"Denies"}  No Rheumatology ROS completed.   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 10/09/2018 10/09/2018   History of depression 06/19/2017   Rheumatoid arthritis involving multiple sites with positive rheumatoid factor ([facility]) 01/25/2017   High risk medication use 01/25/2017   Immunosuppression ([facility]) 01/25/2017   Fibromyalgia 01/25/2017   Primary osteoarthritis of both hands 01/25/2017   Spondylosis of lumbar region without myelopathy or radiculopathy 01/25/2017   DJD (degenerative joint disease), cervical 01/25/2017   History of neutropenia 01/25/2017   History of coronary artery disease 01/25/2017   Lapband APS April 2010 02/25/2013   Wears dentures     Past Medical History:  Diagnosis Date   Arthritis    rheumatoid , osteo    CAD (coronary artery disease)    PCI to LAD 2010    Diverticulitis    with perforation and colostomy placement     Fibromyalgia    Heart disease    Heart murmur    Hyperlipidemia    Hypertension    Myocardial infarction ([facility]) 2008   one Stent   Shingles 03/2020   per patient    Vertigo    per patient    Vitamin D deficiency    Wears dentures    gum disease    Family History  Problem Relation Age of Onset   Macular degeneration Mother    Cancer Father    Rheum arthritis Father    Bipolar disorder Father    Bipolar disorder Daughter    Rheum arthritis Daughter    Past Surgical History:  Procedure Laterality Date   CESAREAN SECTION     COLECTOMY WITH COLOSTOMY CREATION/HARTMANN PROCEDURE  09/2017   Perforated diverticulitis   CORONARY STENT PLACEMENT  08/2009   EYE SURGERY     LAPAROSCOPIC GASTRIC BANDING  04/05/2009   Dr Daphine Deutscher   PROCTOSCOPY N/A 10/09/2018   Procedure: RIDGID PROCTOSCOPY;  Surgeon: Karie Soda, MD;  Location: [facility];  Service: General;  Laterality: N/A;   VAGINAL HYSTERECTOMY     VENTRAL HERNIA REPAIR N/A 07/11/2019   Procedure: LAPAROSCOPIC ASSISTED VENTRAL HERNIA REPAIR;  Surgeon: Luretha Murphy, MD;  Location: [facility];  Service: General;  Laterality: N/A;   Social History   Social History Narrative   Not on file   Immunization History  Administered Date(s) Administered   PFIZER(Purple Top)SARS-COV-2 Vaccination 02/22/2020, 04/07/2020     Objective: Vital Signs: There were no vitals taken for this visit.   Physical Exam   Musculoskeletal Exam: ***  CDAI Exam: CDAI Score: -- Patient Global: --; Provider Global: -- Swollen: --; Tender: -- Joint Exam 05/11/2022   No joint exam has been documented for this visit   There is currently no information documented on the homunculus. Go to the Rheumatology activity and complete the homunculus joint exam.  Investigation: No additional findings.  Imaging: No results found.  Recent Labs: Lab Results  Component Value Date   WBC 7.2  02/09/2022   HGB 13.0 02/09/2022   PLT 195 02/09/2022   NA 140 02/09/2022   K 4.0 02/09/2022   CL 100 02/09/2022   CO2 32 02/09/2022   GLUCOSE 80 02/09/2022   BUN 28 (H) 02/09/2022   CREATININE 1.12 (H) 02/09/2022   BILITOT 0.6 02/09/2022   ALKPHOS 121 07/29/2021   AST 26 02/09/2022   ALT 28 02/09/2022   PROT 6.3 02/09/2022   ALBUMIN 2.1 (L) 07/29/2021   CALCIUM 9.3 02/09/2022   GFRAA 72 04/26/2021    Speciality Comments: PLQ eye exam:06/16/2021. Dr. Ander Purpura. Follow up in 1 year.   Prior therapy includes: Harriette Ohara (GI perforation), Humira (inadequate response), Enbrel (inadequate response), and Orencia (nausea and headaches)  Procedures:  No procedures performed Allergies: Vicodin [hydrocodone-acetaminophen] and Aspirin   Assessment / Plan:     Visit Diagnoses: No diagnosis found.  Orders: No orders of the defined types were placed in this encounter.  No orders of the defined types were placed in this encounter.   Face-to-face time spent with patient was *** minutes. Greater than 50% of time was spent in counseling and coordination of care.  Follow-Up Instructions: No follow-ups on file.   Ellen Henri, CMA  Note - This record has been created using Animal nutritionist.  Chart creation errors have been sought, but may not always  have been located. Such creation errors do not reflect on  the standard of medical care.

## 2022-05-04 ENCOUNTER — Other Ambulatory Visit: Payer: Self-pay | Admitting: Rheumatology

## 2022-05-04 NOTE — Telephone Encounter (Signed)
Next Visit: 05/11/2022 ? ?Last Visit: 02/09/2022 ? ?Last Fill: 02/08/2022 ? ?DX: Rheumatoid arthritis involving multiple sites with positive rheumatoid factor  ? ?Current Dose per office note 02/09/2022: Methotrexate 0.5 ml sq inj once weekly, prednisone 10 mg by mouth daily. ? ?Labs: 02/09/2022 Creatinine is elevated-1.12 and GFR is low at 52.  MCV and MCH are borderline elevated. ? ?Patient to update labs at upcoming appointment on 05/11/2022.  ? ?Okay to refill MTX and Prednisone?  ?

## 2022-05-10 DIAGNOSIS — M150 Primary generalized (osteo)arthritis: Secondary | ICD-10-CM | POA: Diagnosis not present

## 2022-05-10 DIAGNOSIS — Z79891 Long term (current) use of opiate analgesic: Secondary | ICD-10-CM | POA: Diagnosis not present

## 2022-05-10 DIAGNOSIS — G894 Chronic pain syndrome: Secondary | ICD-10-CM | POA: Diagnosis not present

## 2022-05-10 DIAGNOSIS — M0579 Rheumatoid arthritis with rheumatoid factor of multiple sites without organ or systems involvement: Secondary | ICD-10-CM | POA: Diagnosis not present

## 2022-05-11 ENCOUNTER — Ambulatory Visit: Payer: BC Managed Care – PPO | Admitting: Rheumatology

## 2022-05-11 DIAGNOSIS — Z79899 Other long term (current) drug therapy: Secondary | ICD-10-CM

## 2022-05-11 DIAGNOSIS — M797 Fibromyalgia: Secondary | ICD-10-CM

## 2022-05-11 DIAGNOSIS — R778 Other specified abnormalities of plasma proteins: Secondary | ICD-10-CM

## 2022-05-11 DIAGNOSIS — M5136 Other intervertebral disc degeneration, lumbar region: Secondary | ICD-10-CM

## 2022-05-11 DIAGNOSIS — E559 Vitamin D deficiency, unspecified: Secondary | ICD-10-CM

## 2022-05-11 DIAGNOSIS — Z8679 Personal history of other diseases of the circulatory system: Secondary | ICD-10-CM

## 2022-05-11 DIAGNOSIS — Z8639 Personal history of other endocrine, nutritional and metabolic disease: Secondary | ICD-10-CM

## 2022-05-11 DIAGNOSIS — R42 Dizziness and giddiness: Secondary | ICD-10-CM

## 2022-05-11 DIAGNOSIS — M19041 Primary osteoarthritis, right hand: Secondary | ICD-10-CM

## 2022-05-11 DIAGNOSIS — R5383 Other fatigue: Secondary | ICD-10-CM

## 2022-05-11 DIAGNOSIS — M5030 Other cervical disc degeneration, unspecified cervical region: Secondary | ICD-10-CM

## 2022-05-11 DIAGNOSIS — Z7952 Long term (current) use of systemic steroids: Secondary | ICD-10-CM

## 2022-05-11 DIAGNOSIS — I10 Essential (primary) hypertension: Secondary | ICD-10-CM

## 2022-05-11 DIAGNOSIS — Z13820 Encounter for screening for osteoporosis: Secondary | ICD-10-CM

## 2022-05-11 DIAGNOSIS — Z9884 Bariatric surgery status: Secondary | ICD-10-CM

## 2022-05-11 DIAGNOSIS — Z933 Colostomy status: Secondary | ICD-10-CM

## 2022-05-11 DIAGNOSIS — M0579 Rheumatoid arthritis with rheumatoid factor of multiple sites without organ or systems involvement: Secondary | ICD-10-CM

## 2022-05-11 DIAGNOSIS — Z8659 Personal history of other mental and behavioral disorders: Secondary | ICD-10-CM

## 2022-05-15 NOTE — Progress Notes (Deleted)
Office Visit Note  Patient: Vanessa Collins             Date of Birth: 1946/12/03           MRN: [id number]             PCP: Clovis Riley, L.August Saucer, MD Referring: Clovis Riley, Elbert Ewings.August Saucer, MD Visit Date: 05/23/2022 Occupation: @  Subjective:  No chief complaint on file.   History of Present Illness: Vanessa Collins is a 76 y.o. female ***   Activities of Daily Living:  Patient reports morning stiffness for *** {minute/hour:19697}.   Patient {ACTIONS;DENIES/REPORTS:21021675::"Denies"} nocturnal pain.  Difficulty dressing/grooming: {ACTIONS;DENIES/REPORTS:21021675::"Denies"} Difficulty climbing stairs: {ACTIONS;DENIES/REPORTS:21021675::"Denies"} Difficulty getting out of chair: {ACTIONS;DENIES/REPORTS:21021675::"Denies"} Difficulty using hands for taps, buttons, cutlery, and/or writing: {ACTIONS;DENIES/REPORTS:21021675::"Denies"}  No Rheumatology ROS completed.   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 10/09/2018 10/09/2018   History of depression 06/19/2017   Rheumatoid arthritis involving multiple sites with positive rheumatoid factor (HCC) 01/25/2017   High risk medication use 01/25/2017   Immunosuppression (HCC) 01/25/2017   Fibromyalgia 01/25/2017   Primary osteoarthritis of both hands 01/25/2017   Spondylosis of lumbar region without myelopathy or radiculopathy 01/25/2017   DJD (degenerative joint disease), cervical 01/25/2017   History of neutropenia 01/25/2017   History of coronary artery disease 01/25/2017   Lapband APS April 2010 02/25/2013   Wears dentures     Past Medical History:  Diagnosis Date   Arthritis    rheumatoid , osteo    CAD (coronary artery disease)    PCI to LAD 2010    Diverticulitis    with perforation and colostomy placement     Fibromyalgia    Heart disease    Heart murmur    Hyperlipidemia    Hypertension    Myocardial infarction (HCC) 2008   one Stent   Shingles 03/2020   per patient    Vertigo    per patient    Vitamin D deficiency    Wears dentures    gum disease    Family History  Problem Relation Age of Onset   Macular degeneration Mother    Cancer Father    Rheum arthritis Father    Bipolar disorder Father    Bipolar disorder Daughter    Rheum arthritis Daughter    Past Surgical History:  Procedure Laterality Date   CESAREAN SECTION     COLECTOMY WITH COLOSTOMY CREATION/HARTMANN PROCEDURE  09/2017   Perforated diverticulitis   CORONARY STENT PLACEMENT  08/2009   EYE SURGERY     LAPAROSCOPIC GASTRIC BANDING  04/05/2009   Dr Daphine Deutscher   PROCTOSCOPY N/A 10/09/2018   Procedure: RIDGID PROCTOSCOPY;  Surgeon: Karie Soda, MD;  Location: [facility];  Service: General;  Laterality: N/A;   VAGINAL HYSTERECTOMY     VENTRAL HERNIA REPAIR N/A 07/11/2019   Procedure: LAPAROSCOPIC ASSISTED VENTRAL HERNIA REPAIR;  Surgeon: Luretha Murphy, MD;  Location: [facility];  Service: General;  Laterality: N/A;   Social History   Social History Narrative   Not on file   Immunization History  Administered Date(s) Administered   PFIZER(Purple Top)SARS-COV-2 Vaccination 02/22/2020, 04/07/2020     Objective: Vital Signs: There were no vitals taken for this visit.   Physical Exam   Musculoskeletal Exam: ***  CDAI Exam: CDAI Score: -- Patient Global: --; Provider Global: -- Swollen: --; Tender: -- Joint Exam 05/23/2022   No joint exam has been documented for this visit   There is currently no information documented on the homunculus. Go to the Rheumatology activity and complete the homunculus joint exam.  Investigation: No additional findings.  Imaging: No results found.  Recent Labs: Lab Results  Component Value Date   WBC 7.2  02/09/2022   HGB 13.0 02/09/2022   PLT 195 02/09/2022   NA 140 02/09/2022   K 4.0 02/09/2022   CL 100 02/09/2022   CO2 32 02/09/2022   GLUCOSE 80 02/09/2022   BUN 28 (H) 02/09/2022   CREATININE 1.12 (H) 02/09/2022   BILITOT 0.6 02/09/2022   ALKPHOS 121 07/29/2021   AST 26 02/09/2022   ALT 28 02/09/2022   PROT 6.3 02/09/2022   ALBUMIN 2.1 (L) 07/29/2021   CALCIUM 9.3 02/09/2022   GFRAA 72 04/26/2021    Speciality Comments: PLQ eye exam:06/16/2021. Dr. Ander Purpura. Follow up in 1 year.   Prior therapy includes: Harriette Ohara (GI perforation), Humira (inadequate response), Enbrel (inadequate response), and Orencia (nausea and headaches)  Procedures:  No procedures performed Allergies: Vicodin [hydrocodone-acetaminophen] and Aspirin   Assessment / Plan:     Visit Diagnoses: No diagnosis found.  Orders: No orders of the defined types were placed in this encounter.  No orders of the defined types were placed in this encounter.   Face-to-face time spent with patient was *** minutes. Greater than 50% of time was spent in counseling and coordination of care.  Follow-Up Instructions: No follow-ups on file.   Ellen Henri, CMA  Note - This record has been created using Animal nutritionist.  Chart creation errors have been sought, but may not always  have been located. Such creation errors do not reflect on  the standard of medical care.

## 2022-05-23 ENCOUNTER — Ambulatory Visit: Payer: BC Managed Care – PPO | Admitting: Rheumatology

## 2022-05-23 DIAGNOSIS — E559 Vitamin D deficiency, unspecified: Secondary | ICD-10-CM

## 2022-05-23 DIAGNOSIS — Z13820 Encounter for screening for osteoporosis: Secondary | ICD-10-CM

## 2022-05-23 DIAGNOSIS — R5383 Other fatigue: Secondary | ICD-10-CM

## 2022-05-23 DIAGNOSIS — Z9884 Bariatric surgery status: Secondary | ICD-10-CM

## 2022-05-23 DIAGNOSIS — Z933 Colostomy status: Secondary | ICD-10-CM

## 2022-05-23 DIAGNOSIS — M19042 Primary osteoarthritis, left hand: Secondary | ICD-10-CM

## 2022-05-23 DIAGNOSIS — M5136 Other intervertebral disc degeneration, lumbar region: Secondary | ICD-10-CM

## 2022-05-23 DIAGNOSIS — Z79899 Other long term (current) drug therapy: Secondary | ICD-10-CM

## 2022-05-23 DIAGNOSIS — M797 Fibromyalgia: Secondary | ICD-10-CM

## 2022-05-23 DIAGNOSIS — R778 Other specified abnormalities of plasma proteins: Secondary | ICD-10-CM

## 2022-05-23 DIAGNOSIS — M5030 Other cervical disc degeneration, unspecified cervical region: Secondary | ICD-10-CM

## 2022-05-23 DIAGNOSIS — Z8659 Personal history of other mental and behavioral disorders: Secondary | ICD-10-CM

## 2022-05-23 DIAGNOSIS — I10 Essential (primary) hypertension: Secondary | ICD-10-CM

## 2022-05-23 DIAGNOSIS — Z7952 Long term (current) use of systemic steroids: Secondary | ICD-10-CM

## 2022-05-23 DIAGNOSIS — R42 Dizziness and giddiness: Secondary | ICD-10-CM

## 2022-05-23 DIAGNOSIS — Z8639 Personal history of other endocrine, nutritional and metabolic disease: Secondary | ICD-10-CM

## 2022-05-23 DIAGNOSIS — M0579 Rheumatoid arthritis with rheumatoid factor of multiple sites without organ or systems involvement: Secondary | ICD-10-CM

## 2022-05-23 DIAGNOSIS — Z8679 Personal history of other diseases of the circulatory system: Secondary | ICD-10-CM

## 2022-05-30 NOTE — Progress Notes (Unsigned)
Office Visit Note  Patient: Vanessa Collins             Date of Birth: April 17, 1946           MRN: [id number]             PCP: Clovis Riley, L.August Saucer, MD Referring: Clovis Riley, Elbert Ewings.August Saucer, MD Visit Date: 05/31/2022 Occupation: @  Subjective:  Medication monitoring   History of Present Illness: Vanessa Collins is a 76 y.o. female with history of seropositive rheumatoid arthritis and osteoarthritis.  She remains on Methotrexate 0.5 ml sq inj once weekly, folic acid 1 mg daily, Plaquenil 200 mg 1 tablet by mouth BID M-F, and prednisone 10 mg by mouth daily.  She is tolerating this combination of medications without any side effects and has not missed any doses recently.  She states that she has been alternating prednisone 10 mg with 5 mg every other day.  She is no longer taking celebrex.  She continues to see Dr. Vear Clock at the pain management clinic and has been taking tramadol as needed for pain relief.  She has been walking on a daily basis for exercise.  She has also been performing stretching exercises daily to keep her range of motion.  She denies any increased joint swelling at this time.  She has persistent vertigo.  She denies any recent infections.     Activities of Daily Living:  Patient reports joint stiffness all day  Patient Denies nocturnal pain.  Difficulty dressing/grooming: Reports Difficulty climbing stairs: Denies Difficulty getting out of chair: Denies Difficulty using hands for taps, buttons, cutlery, and/or writing: Reports  Review of Systems  Constitutional:  Positive for fatigue.  HENT:  Negative for mouth sores, mouth dryness and nose dryness.   Eyes:  Negative for pain, visual disturbance and dryness.  Respiratory:  Negative for cough, hemoptysis, shortness of breath and difficulty breathing.   Cardiovascular:  Negative for chest pain, palpitations, hypertension and swelling in legs/feet.  Gastrointestinal:  Negative for blood in stool, constipation and diarrhea.   Endocrine: Negative for increased urination.  Genitourinary:  Negative for difficulty urinating and painful urination.  Musculoskeletal:  Positive for joint pain, joint pain, joint swelling, muscle weakness, morning stiffness and muscle tenderness. Negative for myalgias and myalgias.  Skin:  Negative for color change, pallor, rash, hair loss, nodules/bumps, skin tightness, ulcers and sensitivity to sunlight.  Allergic/Immunologic: Negative for susceptible to infections.  Neurological:  Positive for numbness. Negative for dizziness and headaches.  Hematological:  Positive for bruising/bleeding tendency. Negative for swollen glands.  Psychiatric/Behavioral:  Negative for depressed mood and sleep disturbance. The patient is not nervous/anxious.    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 10/09/2018 10/09/2018   History of depression 06/19/2017   Rheumatoid arthritis involving multiple sites with positive rheumatoid factor (HCC) 01/25/2017   High risk medication use 01/25/2017   Immunosuppression (HCC) 01/25/2017   Fibromyalgia 01/25/2017   Primary osteoarthritis of both hands 01/25/2017   Spondylosis of lumbar region without myelopathy or radiculopathy 01/25/2017   DJD (degenerative joint disease), cervical 01/25/2017   History of neutropenia 01/25/2017   History of coronary artery disease 01/25/2017   Lapband APS April 2010 02/25/2013   Wears dentures     Past  Medical History:  Diagnosis Date   Arthritis    rheumatoid , osteo    CAD (coronary artery disease)    PCI to LAD 2010   Diverticulitis    with perforation and colostomy placement     Fibromyalgia    Heart disease    Heart murmur    Hyperlipidemia     Hypertension    Myocardial infarction (HCC) 2008   one Stent   Shingles 03/2020   per patient    Vertigo    per patient    Vitamin D deficiency    Wears dentures    gum disease    Family History  Problem Relation Age of Onset   Macular degeneration Mother    Cancer Father    Rheum arthritis Father    Bipolar disorder Father    Bipolar disorder Daughter    Rheum arthritis Daughter    Past Surgical History:  Procedure Laterality Date   CESAREAN SECTION     COLECTOMY WITH COLOSTOMY CREATION/HARTMANN PROCEDURE  09/2017   Perforated diverticulitis   CORONARY STENT PLACEMENT  08/2009   EYE SURGERY     LAPAROSCOPIC GASTRIC BANDING  04/05/2009   Dr Daphine Deutscher   PROCTOSCOPY N/A 10/09/2018   Procedure: RIDGID PROCTOSCOPY;  Surgeon: Karie Soda, MD;  Location: [facility];  Service: General;  Laterality: N/A;   VAGINAL HYSTERECTOMY     VENTRAL HERNIA REPAIR N/A 07/11/2019   Procedure: LAPAROSCOPIC ASSISTED VENTRAL HERNIA REPAIR;  Surgeon: Luretha Murphy, MD;  Location: [facility];  Service: General;  Laterality: N/A;   Social History   Social History Narrative   Not on file   Immunization History  Administered Date(s) Administered   PFIZER(Purple Top)SARS-COV-2 Vaccination 02/22/2020, 04/07/2020     Objective: Vital Signs: BP 103/71 (BP Location: Left Arm, Patient Position: Sitting, Cuff Size: Normal)   Pulse 65   Resp 15   Ht 5' 3.5" (1.613 m)   Wt 143 lb (64.9 kg)   BMI 24.93 kg/m    Physical Exam Vitals and nursing note reviewed.  Constitutional:      Appearance: She is well-developed.  HENT:     Head: Normocephalic and atraumatic.  Eyes:     Conjunctiva/sclera: Conjunctivae normal.  Cardiovascular:     Rate and Rhythm: Normal rate and regular rhythm.     Heart sounds: Normal heart sounds.  Pulmonary:     Effort: Pulmonary effort is normal.     Breath sounds: Normal breath sounds.  Abdominal:     General: Bowel sounds are normal.     Palpations: Abdomen is soft.   Musculoskeletal:     Cervical back: Normal range of motion.  Skin:    General: Skin is warm and dry.     Capillary Refill: Capillary refill takes less than 2 seconds.  Neurological:     Mental Status: She is alert and oriented to person, place, and time.  Psychiatric:        Behavior: Behavior normal.     Musculoskeletal Exam: C-spine has slightly limited range of motion without rotation.  Thoracic spine and lumbar spine have good ROM.  No midline spinal tenderness or SI joint tenderness.  Shoulder joints, elbow joints, wrist joints, MCPs, PIPs, and DIPs good ROM with no synovitis.  Hip joints have good ROM with no groin pain.  Knee joints have good ROM with warmth or effusion. Synovial thickening of both ankle joints.  Warmth of the left ankle joint noted.  No tenderness or synovitis of MTP  joints.   CDAI Exam: CDAI Score: 1.2  Patient Global: 6 mm; Provider Global: 6 mm Swollen: 0 ; Tender: 0  Joint Exam 05/31/2022   No joint exam has been documented for this visit   There is currently no information documented on the homunculus. Go to the Rheumatology activity and complete the homunculus joint exam.  Investigation: No additional findings.  Imaging: No results found.  Recent Labs: Lab Results  Component Value Date   WBC 7.2 02/09/2022   HGB 13.0 02/09/2022   PLT 195 02/09/2022   NA 140 02/09/2022   K 4.0 02/09/2022   CL 100 02/09/2022   CO2 32 02/09/2022   GLUCOSE 80 02/09/2022   BUN 28 (H) 02/09/2022   CREATININE 1.12 (H) 02/09/2022   BILITOT 0.6 02/09/2022   ALKPHOS 121 07/29/2021   AST 26 02/09/2022   ALT 28 02/09/2022   PROT 6.3 02/09/2022   ALBUMIN 2.1 (L) 07/29/2021   CALCIUM 9.3 02/09/2022   GFRAA 72 04/26/2021    Speciality Comments: PLQ eye exam:06/16/2021. Dr. Ander Purpura. Follow up in 1 year.   Prior therapy includes: Harriette Ohara (GI perforation), Humira (inadequate response), Enbrel (inadequate response), and Orencia (nausea and  headaches)  Procedures:  No procedures performed Allergies: Vicodin [hydrocodone-acetaminophen] and Aspirin    Assessment / Plan:     Visit Diagnoses: Rheumatoid arthritis involving multiple sites with positive rheumatoid factor (HCC) -She has synovial thickening of bilateral ankle joints with warmth in the left ankle joint on examination today.  She has been working on range of motion exercises on a daily basis and has been walking for exercise as well.  Overall her rheumatoid arthritis remains well controlled on the current treatment regimen: Methotrexate 0.5 mg sq injections once weekly, Plaquenil 200 mg 1 tablet by mouth twice daily Monday through Friday and prednisone 10 mg daily.  She has been tolerating these medications without any side effects or recent infections.  She is no longer taking Celebrex but takes tramadol as needed for pain relief.  She has been seeing Dr. Vear Clock at the pain management clinic.  Overall her symptoms have been stable.  She will remain on the current treatment regimen.  She was advised to notify us if she develops increased joint pain or joint swelling.  She will follow-up in the office in 5 months.  Plan: hydroxychloroquine (PLAQUENIL) 200 MG tablet  High risk medication use - Methotrexate 0.5 ml sq inj once weekly, folic acid 1 mg daily, Plaquenil 200 mg 1 tablet by mouth BID M-F, and prednisone 10 mg by mouth daily. PLQ eye exam: 06/16/2021. Dr. Ander Purpura. Follow up in 1 year.  Patient has an updated Plaquenil eye examination scheduled. CBC and CMP updated on 02/09/22.  CBC and CMP will be updated today.  We will call her with these results.  Her next Abrol be due in September and every 3 months to monitor for toxicity.  Standing orders for CBC and CMP remain in place. Hydroxychloroquine level was 620 on 02/09/22.   - Plan: COMPLETE METABOLIC PANEL WITH GFR, CBC with Differential/Platelet She has not had any recent infections.  Discussed the importance of holding  methotrexate if she develops signs or symptoms of an infection and to resume once the infection has completely cleared.  Current chronic use of systemic steroids: She is prescribed prednisone 10 mg daily.  She has been alternating prednisone 10 mg with 5 mg every other day.  She is aware of the risks of long-term prednisone use.  She is no longer taking Celebrex.  Discussed the importance of avoiding concurrent use of prednisone with NSAIDs.  Primary osteoarthritis of both hands: She has some PIP and DIP thickening consistent with osteoarthritis of both hands.  No tenderness or inflammation were noted on examination today.  Discussed the importance of joint protection and muscle strengthening.  DDD (degenerative disc disease), cervical: She has slightly limited range of motion with lateral rotation.  The C-spine with no discomfort at this time.  No symptoms of radiculopathy.  DDD (degenerative disc disease), lumbar: She sees Dr. Vear ClockPhillips at the pain management clinic.  Fibromyalgia: She experiences intermittent myalgias and muscle tenderness due to fibromyalgia.  She continues to experience fatigue on a daily basis.  She has been performing a stretching regimen on a daily basis and has been walking for exercise as well.  She continues to follow-up with Dr. Vear ClockPhillips at pain management.  She has been taking tramadol for pain relief.  Other fatigue: Overall her energy level has been stable.  She has been walking on a daily basis for exercise.  Vitamin D deficiency: She is taking vitamin D 5000 units daily.DEXA updated on 08/09/21: LFN BMD 0.858 with T-score 0.1. Recommended repeating DEXA in 2 years.   History of depression: She remains on Zoloft as prescribed.  Vertigo: She continues to have persistent vertigo.  Her symptoms are worse some days more than others with no identifiable trigger.  Abnormal SPEP: No abnormal protein bands detected on 11/02/2021.  Other medical conditions are listed as  follows:  History of coronary artery disease: Followed by Dr. Sharyn LullHarwani.  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   Lapband APS April 2010  Essential hypertension, benign: Blood pressure was 103/71 today in the office.  Orders: Orders Placed This Encounter  Procedures   COMPLETE METABOLIC PANEL WITH GFR   CBC with Differential/Platelet   Meds ordered this encounter  Medications   hydroxychloroquine (PLAQUENIL) 200 MG tablet    Sig: TAKE 1 TABLET BY MOUTH 2 TIMES DAILY monday through friday ONLY    Dispense:  120 tablet    Refill:  0    Follow-Up Instructions: Return in about 3 months (around 08/31/2022) for Rheumatoid arthritis.   Vanessa Bienenstockaylor M Paislee Szatkowski, PA-C  Note - This record has been created using Dragon software.  Chart creation errors have been sought, but may not always  have been located. Such creation errors do not reflect on  the standard of medical care.

## 2022-05-31 ENCOUNTER — Other Ambulatory Visit: Payer: Self-pay | Admitting: Physician Assistant

## 2022-05-31 ENCOUNTER — Encounter: Payer: Self-pay | Admitting: Physician Assistant

## 2022-05-31 ENCOUNTER — Ambulatory Visit (INDEPENDENT_AMBULATORY_CARE_PROVIDER_SITE_OTHER): Payer: Medicare Other | Admitting: Physician Assistant

## 2022-05-31 VITALS — BP 103/71 | HR 65 | Resp 15 | Ht 63.5 in | Wt 143.0 lb

## 2022-05-31 DIAGNOSIS — M5030 Other cervical disc degeneration, unspecified cervical region: Secondary | ICD-10-CM | POA: Diagnosis not present

## 2022-05-31 DIAGNOSIS — Z8659 Personal history of other mental and behavioral disorders: Secondary | ICD-10-CM

## 2022-05-31 DIAGNOSIS — R5383 Other fatigue: Secondary | ICD-10-CM

## 2022-05-31 DIAGNOSIS — M797 Fibromyalgia: Secondary | ICD-10-CM | POA: Diagnosis not present

## 2022-05-31 DIAGNOSIS — Z8679 Personal history of other diseases of the circulatory system: Secondary | ICD-10-CM

## 2022-05-31 DIAGNOSIS — E559 Vitamin D deficiency, unspecified: Secondary | ICD-10-CM

## 2022-05-31 DIAGNOSIS — M5136 Other intervertebral disc degeneration, lumbar region: Secondary | ICD-10-CM

## 2022-05-31 DIAGNOSIS — M0579 Rheumatoid arthritis with rheumatoid factor of multiple sites without organ or systems involvement: Secondary | ICD-10-CM

## 2022-05-31 DIAGNOSIS — M19042 Primary osteoarthritis, left hand: Secondary | ICD-10-CM

## 2022-05-31 DIAGNOSIS — Z79899 Other long term (current) drug therapy: Secondary | ICD-10-CM

## 2022-05-31 DIAGNOSIS — Z8639 Personal history of other endocrine, nutritional and metabolic disease: Secondary | ICD-10-CM

## 2022-05-31 DIAGNOSIS — Z933 Colostomy status: Secondary | ICD-10-CM

## 2022-05-31 DIAGNOSIS — Z9884 Bariatric surgery status: Secondary | ICD-10-CM

## 2022-05-31 DIAGNOSIS — R778 Other specified abnormalities of plasma proteins: Secondary | ICD-10-CM | POA: Diagnosis not present

## 2022-05-31 DIAGNOSIS — M19041 Primary osteoarthritis, right hand: Secondary | ICD-10-CM | POA: Diagnosis not present

## 2022-05-31 DIAGNOSIS — I10 Essential (primary) hypertension: Secondary | ICD-10-CM

## 2022-05-31 DIAGNOSIS — R42 Dizziness and giddiness: Secondary | ICD-10-CM

## 2022-05-31 DIAGNOSIS — Z7952 Long term (current) use of systemic steroids: Secondary | ICD-10-CM

## 2022-05-31 MED ORDER — HYDROXYCHLOROQUINE SULFATE 200 MG PO TABS: ORAL_TABLET | ORAL | 0 refills | Status: DC

## 2022-05-31 NOTE — Patient Instructions (Signed)

## 2022-06-01 ENCOUNTER — Other Ambulatory Visit: Payer: Self-pay | Admitting: *Deleted

## 2022-06-01 LAB — CBC WITH DIFFERENTIAL/PLATELET
Absolute Monocytes: 297 cells/uL (ref 200–950)
Basophils Absolute: 92 cells/uL (ref 0–200)
Basophils Relative: 1.7 %
Eosinophils Absolute: 38 cells/uL (ref 15–500)
Eosinophils Relative: 0.7 %
HCT: 37.6 % (ref 35.0–45.0)
Hemoglobin: 12.8 g/dL (ref 11.7–15.5)
Lymphs Abs: 1345 cells/uL (ref 850–3900)
MCH: 35.4 pg — ABNORMAL HIGH (ref 27.0–33.0)
MCHC: 34 g/dL (ref 32.0–36.0)
MCV: 103.9 fL — ABNORMAL HIGH (ref 80.0–100.0)
MPV: 11.2 fL (ref 7.5–12.5)
Monocytes Relative: 5.5 %
Neutro Abs: 3629 cells/uL (ref 1500–7800)
Neutrophils Relative %: 67.2 %
Platelets: 230 10*3/uL (ref 140–400)
RBC: 3.62 10*6/uL — ABNORMAL LOW (ref 3.80–5.10)
RDW: 12.6 % (ref 11.0–15.0)
Total Lymphocyte: 24.9 %
WBC: 5.4 10*3/uL (ref 3.8–10.8)

## 2022-06-01 LAB — COMPLETE METABOLIC PANEL WITH GFR
AG Ratio: 1.8 (calc) (ref 1.0–2.5)
ALT: 20 U/L (ref 6–29)
AST: 21 U/L (ref 10–35)
Albumin: 4.1 g/dL (ref 3.6–5.1)
Alkaline phosphatase (APISO): 34 U/L — ABNORMAL LOW (ref 37–153)
BUN/Creatinine Ratio: 19 (calc) (ref 6–22)
BUN: 23 mg/dL (ref 7–25)
CO2: 32 mmol/L (ref 20–32)
Calcium: 9.8 mg/dL (ref 8.6–10.4)
Chloride: 103 mmol/L (ref 98–110)
Creat: 1.2 mg/dL — ABNORMAL HIGH (ref 0.60–1.00)
Globulin: 2.3 g/dL (calc) (ref 1.9–3.7)
Glucose, Bld: 91 mg/dL (ref 65–99)
Potassium: 3.9 mmol/L (ref 3.5–5.3)
Sodium: 142 mmol/L (ref 135–146)
Total Bilirubin: 0.4 mg/dL (ref 0.2–1.2)
Total Protein: 6.4 g/dL (ref 6.1–8.1)
eGFR: 48 mL/min/{1.73_m2} — ABNORMAL LOW (ref >=60)

## 2022-06-01 MED ORDER — METHOTREXATE SODIUM CHEMO INJECTION 50 MG/2ML
10.0000 mg | INTRAMUSCULAR | 0 refills | Status: DC
Start: 2022-06-01 — End: 2022-08-31

## 2022-06-01 NOTE — Telephone Encounter (Signed)
-----   Message from Pollyann Savoy, MD sent at 06/01/2022 10:13 AM EDT ----- CBC is normal.  Creatinine is elevated with low GFR.  Patient should reduce the dose of methotrexate to 0.4 mL subcutaneous weekly.  She should avoid Celebrex.  Please forward results to her nephrologist and PCP.

## 2022-06-01 NOTE — Progress Notes (Signed)
CBC is normal.  Creatinine is elevated with low GFR.  Patient should reduce the dose of methotrexate to 0.4 mL subcutaneous weekly.  She should avoid Celebrex.  Please forward results to her nephrologist and PCP.

## 2022-06-20 ENCOUNTER — Other Ambulatory Visit: Payer: Self-pay | Admitting: Physician Assistant

## 2022-07-05 DIAGNOSIS — M0579 Rheumatoid arthritis with rheumatoid factor of multiple sites without organ or systems involvement: Secondary | ICD-10-CM | POA: Diagnosis not present

## 2022-07-05 DIAGNOSIS — Z79891 Long term (current) use of opiate analgesic: Secondary | ICD-10-CM | POA: Diagnosis not present

## 2022-07-05 DIAGNOSIS — G894 Chronic pain syndrome: Secondary | ICD-10-CM | POA: Diagnosis not present

## 2022-07-05 DIAGNOSIS — M150 Primary generalized (osteo)arthritis: Secondary | ICD-10-CM | POA: Diagnosis not present

## 2022-08-03 ENCOUNTER — Other Ambulatory Visit: Payer: Self-pay | Admitting: Rheumatology

## 2022-08-03 ENCOUNTER — Other Ambulatory Visit: Payer: Self-pay | Admitting: Physician Assistant

## 2022-08-03 DIAGNOSIS — M0579 Rheumatoid arthritis with rheumatoid factor of multiple sites without organ or systems involvement: Secondary | ICD-10-CM

## 2022-08-03 NOTE — Telephone Encounter (Signed)
Next Visit: 09/13/2022   Last Visit: 05/31/2022   Last Fill: 05/04/2022  Dx: Rheumatoid arthritis involving multiple sites with positive rheumatoid factor    Current Dose per office note on 05/31/2022:prednisone 10 mg by mouth daily.  Okay to refill Prednisone?

## 2022-08-08 ENCOUNTER — Other Ambulatory Visit: Payer: Self-pay | Admitting: Physician Assistant

## 2022-08-08 DIAGNOSIS — M0579 Rheumatoid arthritis with rheumatoid factor of multiple sites without organ or systems involvement: Secondary | ICD-10-CM

## 2022-08-30 ENCOUNTER — Other Ambulatory Visit: Payer: Self-pay | Admitting: Rheumatology

## 2022-08-31 DIAGNOSIS — Z23 Encounter for immunization: Secondary | ICD-10-CM | POA: Diagnosis not present

## 2022-08-31 DIAGNOSIS — M069 Rheumatoid arthritis, unspecified: Secondary | ICD-10-CM | POA: Diagnosis not present

## 2022-08-31 DIAGNOSIS — I10 Essential (primary) hypertension: Secondary | ICD-10-CM | POA: Diagnosis not present

## 2022-08-31 DIAGNOSIS — Z0000 Encounter for general adult medical examination without abnormal findings: Secondary | ICD-10-CM | POA: Diagnosis not present

## 2022-08-31 DIAGNOSIS — Z9884 Bariatric surgery status: Secondary | ICD-10-CM | POA: Diagnosis not present

## 2022-08-31 DIAGNOSIS — I252 Old myocardial infarction: Secondary | ICD-10-CM | POA: Diagnosis not present

## 2022-08-31 DIAGNOSIS — E039 Hypothyroidism, unspecified: Secondary | ICD-10-CM | POA: Diagnosis not present

## 2022-08-31 DIAGNOSIS — E559 Vitamin D deficiency, unspecified: Secondary | ICD-10-CM | POA: Diagnosis not present

## 2022-08-31 DIAGNOSIS — E7800 Pure hypercholesterolemia, unspecified: Secondary | ICD-10-CM | POA: Diagnosis not present

## 2022-08-31 DIAGNOSIS — I2510 Atherosclerotic heart disease of native coronary artery without angina pectoris: Secondary | ICD-10-CM | POA: Diagnosis not present

## 2022-08-31 NOTE — Telephone Encounter (Signed)
Next Visit: 09/13/2022  Last Visit: 05/31/2022  Last Fill: 06/01/2022  DX: Rheumatoid arthritis involving multiple sites with positive rheumatoid factor   Current Dose per lab note 06/01/2022: Patient should reduce the dose of methotrexate to 0.4 mL subcutaneous weekly  Labs: 06/01/2022 CBC is normal.  Creatinine is elevated with low GFR.  Patient to update labs at upcoming appointment 09/13/2022  Okay to refill MTX?

## 2022-08-31 NOTE — Progress Notes (Signed)
Office Visit Note  Patient: Vanessa Collins             Date of Birth: [birth year]           MRN: [id number]             PCP: Clovis Riley, L.August Saucer, MD Referring: Clovis Riley, Elbert Ewings.August Saucer, MD Visit Date: 09/13/2022 Occupation: @  Subjective:  Medication management  History of Present Illness: Vanessa Collins is a 76 y.o. female with history of seropositive rheumatoid arthritis and osteoarthritis.  She states she had recent urinary tract infection which was treated.  She has been seeing her nephrologist on a regular basis.  She stopped taking Celebrex.  She is has been taking methotrexate, hydroxychloroquine and prednisone on a regular basis.  She states her symptoms are not controlled without prednisone.  She has been going to pain clinic as well.  Activities of Daily Living:  Patient reports morning stiffness for 4-5 hours.   Patient Reports nocturnal pain.  Difficulty dressing/grooming: Reports Difficulty climbing stairs: Denies Difficulty getting out of chair: Denies Difficulty using hands for taps, buttons, cutlery, and/or writing: Denies  Review of Systems  Constitutional:  Negative for fatigue.  HENT:  Negative for mouth sores and mouth dryness.   Eyes:  Negative for dryness.  Respiratory:  Negative for shortness of breath.   Cardiovascular:  Negative for chest pain and palpitations.  Gastrointestinal:  Negative for blood in stool, constipation and diarrhea.  Endocrine: Negative for increased urination.  Genitourinary:  Negative for involuntary urination.  Musculoskeletal:  Positive for joint pain, gait problem, joint pain, joint swelling, myalgias, muscle weakness, morning stiffness, muscle tenderness and myalgias.  Skin:  Negative for color change, rash, hair loss and sensitivity to sunlight.  Allergic/Immunologic: Negative for susceptible to infections.  Neurological:  Positive for dizziness. Negative for headaches.  Hematological:  Negative for swollen glands.   Psychiatric/Behavioral:  Negative for depressed mood and sleep disturbance. The patient is not nervous/anxious.     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 10/09/2018 10/09/2018   History of depression 06/19/2017   Rheumatoid arthritis involving multiple sites with positive rheumatoid factor (HCC) 01/25/2017   High risk medication use 01/25/2017   Immunosuppression (HCC) 01/25/2017   Fibromyalgia 01/25/2017   Primary osteoarthritis of both hands 01/25/2017   Spondylosis of lumbar region without myelopathy or radiculopathy 01/25/2017   DJD (degenerative joint disease), cervical 01/25/2017   History of neutropenia 01/25/2017   History of coronary artery disease 01/25/2017   Lapband APS April 2010 02/25/2013   Wears dentures     Past Medical History:  Diagnosis Date   Arthritis    rheumatoid , osteo    CAD (coronary artery disease)    PCI to LAD 2010   Diverticulitis    with perforation and colostomy placement     Fibromyalgia    Heart disease    Heart murmur    Hyperlipidemia    Hypertension    Myocardial infarction [facility]) 2008   one Stent   Shingles 03/2020   per patient    Vertigo    per patient    Vitamin D deficiency    Wears dentures    gum disease    Family History  Problem  Relation Age of Onset   Macular degeneration Mother    Cancer Father    Rheum arthritis Father    Bipolar disorder Father    Bipolar disorder Daughter    Rheum arthritis Daughter    Past Surgical History:  Procedure Laterality Date   CESAREAN SECTION     COLECTOMY WITH COLOSTOMY CREATION/HARTMANN PROCEDURE  09/2017   Perforated diverticulitis   CORONARY STENT PLACEMENT  08/2009   EYE SURGERY     LAPAROSCOPIC  GASTRIC BANDING  04/05/2009   Dr Daphine Deutscher   PROCTOSCOPY N/A 10/09/2018   Procedure: RIDGID PROCTOSCOPY;  Surgeon: Karie Soda, MD;  Location: [facility];  Service: General;  Laterality: N/A;   VAGINAL HYSTERECTOMY     VENTRAL HERNIA REPAIR N/A 07/11/2019   Procedure: LAPAROSCOPIC ASSISTED VENTRAL HERNIA REPAIR;  Surgeon: Luretha Murphy, MD;  Location: [facility];  Service: General;  Laterality: N/A;   Social History   Social History Narrative   Not on file   Immunization History  Administered Date(s) Administered   PFIZER(Purple Top)SARS-COV-2 Vaccination 02/22/2020, 04/07/2020     Objective: Vital Signs: BP 103/67 (BP Location: Left Arm, Patient Position: Sitting, Cuff Size: Normal)   Pulse 65   Resp 14   Ht 5\' 3"  (1.6 m)   Wt 148 lb 6.4 oz (67.3 kg)   BMI 26.29 kg/m    Physical Exam Vitals and nursing note reviewed.  Constitutional:      Appearance: She is well-developed.  HENT:     Head: Normocephalic and atraumatic.  Eyes:     Conjunctiva/sclera: Conjunctivae normal.  Cardiovascular:     Rate and Rhythm: Normal rate and regular rhythm.     Heart sounds: Normal heart sounds.  Pulmonary:     Effort: Pulmonary effort is normal.     Breath sounds: Normal breath sounds.  Abdominal:     General: Bowel sounds are normal.     Palpations: Abdomen is soft.  Musculoskeletal:     Cervical back: Normal range of motion.  Lymphadenopathy:     Cervical: No cervical adenopathy.  Skin:    General: Skin is warm and dry.     Capillary Refill: Capillary refill takes less than 2 seconds.  Neurological:     Mental Status: She is alert and oriented to person, place, and time.  Psychiatric:        Behavior: Behavior normal.      Musculoskeletal Exam: She had good range of motion of the cervical spine without discomfort.  She had no tenderness over thoracic or lumbar spine.  Shoulder joints and elbow joints in good range of motion.  Wrist joints with good range of motion with no  synovitis.  She had MCP thickening with no synovitis.  PIP and DIP prominence was noted.  Hip joints and knee joints with good range of motion without any warmth swelling or effusion.  She has thickening of her left ankle joint.  Some warmth was noted on the palpation of her left ankle joint.  There was no tenderness over MTPs or PIPs.  CDAI Exam: CDAI Score: 0.8  Patient Global: 5 mm; Provider Global: 3 mm Swollen: 1 ; Tender: 0  Joint Exam 09/13/2022      Right  Left  Ankle     Swollen      Investigation: No additional findings.  Imaging: No results found.  Recent Labs: Lab Results  Component Value Date   WBC 5.4 05/31/2022   HGB 12.8 05/31/2022   PLT 230 05/31/2022  NA 142 05/31/2022   K 3.9 05/31/2022   CL 103 05/31/2022   CO2 32 05/31/2022   GLUCOSE 91 05/31/2022   BUN 23 05/31/2022   CREATININE 1.20 (H) 05/31/2022   BILITOT 0.4 05/31/2022   ALKPHOS 121 07/29/2021   AST 21 05/31/2022   ALT 20 05/31/2022   PROT 6.4 05/31/2022   ALBUMIN 2.1 (L) 07/29/2021   CALCIUM 9.8 05/31/2022   GFRAA 72 04/26/2021   Labs received from:Noelle Redmon, PA   Drawn on:08/31/2022   Reviewed by: Sherron Ales, PA-C   Labs drawn:CMP, Lipid Panel, Micro-albumin Panel   Results:GFR 46  CO2 33  ALP 34  Chol/HDL Ratio 1.9   Patient is on MTX 0.4 mL SQ weekly, Prednisone 10 mg po daily and PLQ 200 mg po BID M-F.   Per Ladona Ridgel, advise patient to avoid use of all NSAIDS. Speciality Comments: PLQ eye exam:06/16/2021. Dr. Ander Purpura. Follow up in 1 year.   Prior therapy includes: Harriette Ohara (GI perforation), Humira (inadequate response), Enbrel (inadequate response), and Orencia (nausea and headaches)  Procedures:  No procedures performed Allergies: Vicodin [hydrocodone-acetaminophen] and Aspirin   Assessment / Plan:     Visit Diagnoses: Rheumatoid arthritis involving multiple sites with positive rheumatoid factor (HCC)-patient states that she continues to have pain and stiffness in  her joints.  She has intermittent swelling.  She has some ongoing pain and swelling in her left ankle joint and intermittent swelling in her hands.  She has been taking methotrexate, hydroxychloroquine and prednisone on a regular basis.  She states she had recent urinary tract infection which was treated with antibiotics.  She is not taking Celebrex anymore.  She goes to the pain clinic which has been helpful to control her pain symptoms.  High risk medication use - Methotrexate 0.5 ml sq inj once weekly, folic acid 1 mg daily, Plaquenil 200 mg 1 tablet by mouth BID M-F, and prednisone 10 mg po qd.PLQ eye exam: 06/16/2021. -Labs from August 31, 2022 showed GFR 46.  Patient states that she has been followed by nephrologist and they are not concerned about the methotrexate use.  I recommended repeating the labs today.  We do not have a CBC.  Patient will come tomorrow for the lab work.  Plan: CBC with Differential/Platelet, COMPLETE METABOLIC PANEL WITH GFR.  She was advised to get labs every 3 months.  Information on her immunization was placed in the AVS.  She was advised to hold methotrexate if she develops an infection and resume after the infection resolves.  Current chronic use of systemic steroids -she is on prednisone 10 mg p.o. every morning.  Side effects from long-term use of prednisone use were advised.  Patient understands the side effects and she is unable to taper prednisone.  Primary osteoarthritis of both hands-she had bilateral PIP and DIP thickening but no synovitis was noted.  She continues to have discomfort in her hands.  DDD (degenerative disc disease), cervical-she complains of intermittent stiffness in her cervical spine.  She had good range of motion.  DDD (degenerative disc disease), lumbar -she goes to Dr. Vear Clock at the pain management clinic.  Other fatigue-she continues to have some generalized fatigue.  Fibromyalgia-she is denies pain from fibromyalgia.  She has  hyperalgesia.  Pain is manageable with tramadol.  Vitamin D deficiency-she takes vitamin D 5000 units daily.  Her DEXA scan was normal on August 09, 2021.  Other medical problems are listed as follows:  Abnormal SPEP  History of hyperlipidemia  History of coronary artery disease  History of hypertension  Status post colostomy (HCC)  Lapband APS April 2010  History of depression  Vertigo  Orders: Orders Placed This Encounter  Procedures   CBC with Differential/Platelet   COMPLETE METABOLIC PANEL WITH GFR   No orders of the defined types were placed in this encounter.    Follow-Up Instructions: Return in about 3 months (around 12/13/2022) for Rheumatoid arthritis, Osteoarthritis.   Pollyann Savoy, MD  Note - This record has been created using Animal nutritionist.  Chart creation errors have been sought, but may not always  have been located. Such creation errors do not reflect on  the standard of medical care.

## 2022-09-13 ENCOUNTER — Telehealth: Payer: Self-pay | Admitting: *Deleted

## 2022-09-13 ENCOUNTER — Ambulatory Visit: Payer: Medicare Other | Attending: Rheumatology | Admitting: Rheumatology

## 2022-09-13 ENCOUNTER — Encounter: Payer: Self-pay | Admitting: Rheumatology

## 2022-09-13 VITALS — BP 103/67 | HR 65 | Resp 14 | Ht 63.0 in | Wt 148.4 lb

## 2022-09-13 DIAGNOSIS — M19041 Primary osteoarthritis, right hand: Secondary | ICD-10-CM

## 2022-09-13 DIAGNOSIS — M5136 Other intervertebral disc degeneration, lumbar region: Secondary | ICD-10-CM

## 2022-09-13 DIAGNOSIS — R778 Other specified abnormalities of plasma proteins: Secondary | ICD-10-CM | POA: Diagnosis not present

## 2022-09-13 DIAGNOSIS — Z8679 Personal history of other diseases of the circulatory system: Secondary | ICD-10-CM

## 2022-09-13 DIAGNOSIS — M0579 Rheumatoid arthritis with rheumatoid factor of multiple sites without organ or systems involvement: Secondary | ICD-10-CM | POA: Diagnosis not present

## 2022-09-13 DIAGNOSIS — E039 Hypothyroidism, unspecified: Secondary | ICD-10-CM | POA: Diagnosis not present

## 2022-09-13 DIAGNOSIS — Z8659 Personal history of other mental and behavioral disorders: Secondary | ICD-10-CM

## 2022-09-13 DIAGNOSIS — Z8639 Personal history of other endocrine, nutritional and metabolic disease: Secondary | ICD-10-CM

## 2022-09-13 DIAGNOSIS — E785 Hyperlipidemia, unspecified: Secondary | ICD-10-CM | POA: Diagnosis not present

## 2022-09-13 DIAGNOSIS — R5383 Other fatigue: Secondary | ICD-10-CM | POA: Diagnosis not present

## 2022-09-13 DIAGNOSIS — Z933 Colostomy status: Secondary | ICD-10-CM

## 2022-09-13 DIAGNOSIS — M19042 Primary osteoarthritis, left hand: Secondary | ICD-10-CM

## 2022-09-13 DIAGNOSIS — E559 Vitamin D deficiency, unspecified: Secondary | ICD-10-CM

## 2022-09-13 DIAGNOSIS — M5030 Other cervical disc degeneration, unspecified cervical region: Secondary | ICD-10-CM

## 2022-09-13 DIAGNOSIS — R42 Dizziness and giddiness: Secondary | ICD-10-CM

## 2022-09-13 DIAGNOSIS — Z7952 Long term (current) use of systemic steroids: Secondary | ICD-10-CM

## 2022-09-13 DIAGNOSIS — I10 Essential (primary) hypertension: Secondary | ICD-10-CM | POA: Diagnosis not present

## 2022-09-13 DIAGNOSIS — Z79899 Other long term (current) drug therapy: Secondary | ICD-10-CM

## 2022-09-13 DIAGNOSIS — Z9884 Bariatric surgery status: Secondary | ICD-10-CM

## 2022-09-13 DIAGNOSIS — M797 Fibromyalgia: Secondary | ICD-10-CM

## 2022-09-13 DIAGNOSIS — I2510 Atherosclerotic heart disease of native coronary artery without angina pectoris: Secondary | ICD-10-CM | POA: Diagnosis not present

## 2022-09-13 NOTE — Telephone Encounter (Signed)
Discussed with patient while in the office for appointment.

## 2022-09-13 NOTE — Telephone Encounter (Signed)
Attempted to contact the patient and left message for patient to call the office.  

## 2022-09-13 NOTE — Telephone Encounter (Signed)
Labs received from:Noelle Redmon, PA  Drawn on:08/31/2022  Reviewed by: Hazel Sams, PA-C  Labs drawn:CMP, Lipid Panel, Micro-albumin Panel  Results:GFR 46  CO2 33  ALP 34  Chol/HDL Ratio 1.9  Patient is on MTX 0.4 mL SQ weekly, Prednisone 10 mg po daily and PLQ 200 mg po BID M-F.  Per Lovena Le, advise patient to avoid use of all NSAIDS.

## 2022-09-13 NOTE — Patient Instructions (Signed)
Standing Labs We placed an order today for your standing lab work.   Please have your standing labs drawn in  December and every 3 months If possible, please have your labs drawn 2 weeks prior to your appointment so that the provider can discuss your results at your appointment.  Please note that you may see your imaging and lab results in [place] before we have reviewed them. We may be awaiting multiple results to interpret others before contacting you. Please allow our office up to 72 hours to thoroughly review all of the results before contacting the office for clarification of your results.  We currently have open lab daily: Monday through Thursday from 1:30 PM-4:30 PM and Friday from 1:30 PM- 4:00 PM If possible, please come for your lab work on Monday, Thursday or Friday afternoons, as you may experience shorter wait times.   Effective October 25, 2022 the new lab hours will change to: Monday through Thursday from 1:30 PM-5:00 PM and Friday from 8:30 AM-12:00 PM If possible, please come for your lab work on Monday and Thursday afternoons, as you may experience shorter wait times.  Please be advised, all patients with office appointments requiring lab work will take precedent over walk-in lab work.    The office is located at [address] [place], [place], [place] [address] No appointment is necessary.   Labs are drawn by [facility]. Please bring your co-pay at the time of your lab draw.  You may receive a bill from [facility] for your lab work.  Please note if you are on Hydroxychloroquine and and an order has been placed for a Hydroxychloroquine level, you will need to have it drawn 4 hours or more after your last dose.  If you wish to have your labs drawn at another location, please call the office 24 hours in advance to send orders.  If you have any questions regarding directions or hours of operation,  please call [phone].   As a reminder, please drink plenty of water prior to  coming for your lab work. Thanks!   If you have signs or symptoms of an infection or start antibiotics: First, call your PCP for workup of your infection. Hold your medication through the infection, until you complete your antibiotics, and until symptoms resolve if you take the following: Injectable medication (Actemra, Benlysta, Cimzia, Cosentyx, Enbrel, Humira, Kevzara, Orencia, Remicade, Simponi, Stelara, Taltz, Tremfya) Methotrexate Leflunomide (Arava) Mycophenolate (Cellcept) Morrie Sheldon, Olumiant, or Rinvoq  If you have signs or symptoms of an infection or start antibiotics: First, call your PCP for workup of your infection. Hold your medication through the infection, until you complete your antibiotics, and until symptoms resolve if you take the following: Injectable medication (Actemra, Benlysta, Cimzia, Cosentyx, Enbrel, Humira, Kevzara, Orencia, Remicade, Simponi, Stelara, Taltz, Tremfya) Methotrexate Leflunomide (Arava) Mycophenolate (Cellcept) Morrie Sheldon, Olumiant, or Rinvoq

## 2022-09-14 DIAGNOSIS — G894 Chronic pain syndrome: Secondary | ICD-10-CM | POA: Diagnosis not present

## 2022-09-14 DIAGNOSIS — Z79899 Other long term (current) drug therapy: Secondary | ICD-10-CM | POA: Diagnosis not present

## 2022-09-14 DIAGNOSIS — M150 Primary generalized (osteo)arthritis: Secondary | ICD-10-CM | POA: Diagnosis not present

## 2022-09-14 DIAGNOSIS — Z79891 Long term (current) use of opiate analgesic: Secondary | ICD-10-CM | POA: Diagnosis not present

## 2022-09-14 DIAGNOSIS — M0579 Rheumatoid arthritis with rheumatoid factor of multiple sites without organ or systems involvement: Secondary | ICD-10-CM | POA: Diagnosis not present

## 2022-09-15 ENCOUNTER — Other Ambulatory Visit: Payer: Self-pay | Admitting: Physician Assistant

## 2022-09-15 ENCOUNTER — Other Ambulatory Visit: Payer: Self-pay | Admitting: *Deleted

## 2022-09-15 DIAGNOSIS — M0579 Rheumatoid arthritis with rheumatoid factor of multiple sites without organ or systems involvement: Secondary | ICD-10-CM

## 2022-09-15 LAB — CBC WITH DIFFERENTIAL/PLATELET
Absolute Monocytes: 352 cells/uL (ref 200–950)
Basophils Absolute: 82 cells/uL (ref 0–200)
Basophils Relative: 1.6 %
Eosinophils Absolute: 41 cells/uL (ref 15–500)
Eosinophils Relative: 0.8 %
HCT: 35.9 % (ref 35.0–45.0)
Hemoglobin: 12.3 g/dL (ref 11.7–15.5)
Lymphs Abs: 887 cells/uL (ref 850–3900)
MCH: 35.4 pg — ABNORMAL HIGH (ref 27.0–33.0)
MCHC: 34.3 g/dL (ref 32.0–36.0)
MCV: 103.5 fL — ABNORMAL HIGH (ref 80.0–100.0)
MPV: 11.5 fL (ref 7.5–12.5)
Monocytes Relative: 6.9 %
Neutro Abs: 3738 cells/uL (ref 1500–7800)
Neutrophils Relative %: 73.3 %
Platelets: 196 10*3/uL (ref 140–400)
RBC: 3.47 10*6/uL — ABNORMAL LOW (ref 3.80–5.10)
RDW: 13.1 % (ref 11.0–15.0)
Total Lymphocyte: 17.4 %
WBC: 5.1 10*3/uL (ref 3.8–10.8)

## 2022-09-15 LAB — COMPLETE METABOLIC PANEL WITH GFR
AG Ratio: 1.9 (calc) (ref 1.0–2.5)
ALT: 24 U/L (ref 6–29)
AST: 23 U/L (ref 10–35)
Albumin: 4 g/dL (ref 3.6–5.1)
Alkaline phosphatase (APISO): 36 U/L — ABNORMAL LOW (ref 37–153)
BUN/Creatinine Ratio: 20 (calc) (ref 6–22)
BUN: 26 mg/dL — ABNORMAL HIGH (ref 7–25)
CO2: 32 mmol/L (ref 20–32)
Calcium: 9.5 mg/dL (ref 8.6–10.4)
Chloride: 102 mmol/L (ref 98–110)
Creat: 1.27 mg/dL — ABNORMAL HIGH (ref 0.60–1.00)
Globulin: 2.1 g/dL (calc) (ref 1.9–3.7)
Glucose, Bld: 95 mg/dL (ref 65–99)
Potassium: 4.4 mmol/L (ref 3.5–5.3)
Sodium: 141 mmol/L (ref 135–146)
Total Bilirubin: 0.4 mg/dL (ref 0.2–1.2)
Total Protein: 6.1 g/dL (ref 6.1–8.1)
eGFR: 44 mL/min/{1.73_m2} — ABNORMAL LOW (ref >=60)

## 2022-09-15 MED ORDER — METHOTREXATE SODIUM CHEMO INJECTION 50 MG/2ML: 7.5000 mg | INTRAMUSCULAR | 0 refills | Status: DC

## 2022-09-15 NOTE — Progress Notes (Signed)
CBC is normal.  GFR is low at 44.  Patient should reduce methotrexate to 0.3 mL subcu weekly.  She should avoid all NSAID use.  Please forward results to her PCP.

## 2022-09-15 NOTE — Telephone Encounter (Signed)
Next Visit: 01/02/2023  Last Visit: 09/13/2022  Labs: 09/15/2022  CBC is normal.  GFR is low at 44.  Patient should reduce methotrexate to 0.3 mL subcu weekly.  She should avoid all NSAID use.  Please forward results to her PCP  Eye exam: 06/16/2021   Current Dose per office note 09/13/2022: Plaquenil 200 mg 1 tablet by mouth BID M-F  [id number] arthritis involving multiple sites with positive rheumatoid factor   Last Fill: 05/31/2022  Okay to refill Plaquenil?

## 2022-09-15 NOTE — Telephone Encounter (Signed)
-----   Message from Bo Merino, MD sent at 09/15/2022  1:32 PM EDT ----- CBC is normal.  GFR is low at 44.  Patient should reduce methotrexate to 0.3 mL subcu weekly.  She should avoid all NSAID use.  Please forward results to her PCP.

## 2022-09-15 NOTE — Telephone Encounter (Signed)
I called patient, patient will call to schedule PLQ Eye Exam

## 2022-10-27 DIAGNOSIS — H43393 Other vitreous opacities, bilateral: Secondary | ICD-10-CM | POA: Diagnosis not present

## 2022-10-27 DIAGNOSIS — E119 Type 2 diabetes mellitus without complications: Secondary | ICD-10-CM | POA: Diagnosis not present

## 2022-10-27 DIAGNOSIS — Z79899 Other long term (current) drug therapy: Secondary | ICD-10-CM | POA: Diagnosis not present

## 2022-11-13 ENCOUNTER — Other Ambulatory Visit: Payer: Self-pay | Admitting: Rheumatology

## 2022-11-13 DIAGNOSIS — M0579 Rheumatoid arthritis with rheumatoid factor of multiple sites without organ or systems involvement: Secondary | ICD-10-CM

## 2022-11-17 ENCOUNTER — Other Ambulatory Visit: Payer: Self-pay | Admitting: Rheumatology

## 2022-11-20 NOTE — Telephone Encounter (Signed)
Next Visit: 01/02/2023  Last Visit: 09/13/2022  Last Fill: 08/31/2022  DX: Rheumatoid arthritis involving multiple sites with positive rheumatoid factor   Current Dose per lab note 09/15/2022:  Patient should reduce methotrexate to 0.3 mL subcu weekly.   Labs: 09/15/2022 CBC is normal.  GFR is low at 44.   Okay to refill MTX?

## 2022-11-28 DIAGNOSIS — M150 Primary generalized (osteo)arthritis: Secondary | ICD-10-CM | POA: Diagnosis not present

## 2022-11-28 DIAGNOSIS — Z79891 Long term (current) use of opiate analgesic: Secondary | ICD-10-CM | POA: Diagnosis not present

## 2022-11-28 DIAGNOSIS — M0579 Rheumatoid arthritis with rheumatoid factor of multiple sites without organ or systems involvement: Secondary | ICD-10-CM | POA: Diagnosis not present

## 2022-11-28 DIAGNOSIS — G894 Chronic pain syndrome: Secondary | ICD-10-CM | POA: Diagnosis not present

## 2022-12-11 ENCOUNTER — Other Ambulatory Visit: Payer: Self-pay | Admitting: Rheumatology

## 2022-12-11 DIAGNOSIS — M0579 Rheumatoid arthritis with rheumatoid factor of multiple sites without organ or systems involvement: Secondary | ICD-10-CM

## 2022-12-11 NOTE — Telephone Encounter (Signed)
Next Visit: 01/02/2023  Last Visit: 09/13/2022  Labs: 09/14/2022 CBC is normal.  GFR is low at 44.     Eye exam: 06/16/2021.   Current Dose per office note 09/13/2022: Plaquenil 200 mg 1 tablet by mouth BID M-F   DX: Rheumatoid arthritis involving multiple sites with positive rheumatoid factor   Last Fill: 09/15/2022  Left message to advise patient we need her updated PLQ eye exam.   Okay to refill Plaquenil?

## 2022-12-15 ENCOUNTER — Other Ambulatory Visit: Payer: Self-pay | Admitting: Physician Assistant

## 2022-12-15 DIAGNOSIS — M0579 Rheumatoid arthritis with rheumatoid factor of multiple sites without organ or systems involvement: Secondary | ICD-10-CM

## 2023-01-01 ENCOUNTER — Ambulatory Visit: Payer: Medicare Other | Admitting: Physician Assistant

## 2023-01-02 ENCOUNTER — Ambulatory Visit: Payer: Medicare Other | Admitting: Physician Assistant

## 2023-01-05 ENCOUNTER — Other Ambulatory Visit: Payer: Self-pay | Admitting: Physician Assistant

## 2023-01-05 DIAGNOSIS — M0579 Rheumatoid arthritis with rheumatoid factor of multiple sites without organ or systems involvement: Secondary | ICD-10-CM

## 2023-01-05 MED ORDER — HYDROXYCHLOROQUINE SULFATE 200 MG PO TABS: ORAL_TABLET | ORAL | 0 refills | Status: DC

## 2023-01-05 NOTE — Telephone Encounter (Signed)
Next Visit: 01/02/2023   Last Visit: 09/13/2022   Labs: 09/14/2022 CBC is normal.  GFR is low at 44.      Eye exam: 10/27/2022 WNL   Current Dose per office note 09/13/2022: Plaquenil 200 mg 1 tablet by mouth BID M-F    DX: Rheumatoid arthritis involving multiple sites with positive rheumatoid factor    Last Fill: 12/11/2022 (30 day supply)   Okay to refill Plaquenil?

## 2023-01-08 ENCOUNTER — Other Ambulatory Visit: Payer: Self-pay | Admitting: Physician Assistant

## 2023-01-15 NOTE — Progress Notes (Deleted)
Office Visit Note  Patient: Vanessa Collins             Date of Birth: 18-Aug-1946           MRN: [id number]             PCP: Alroy Dust, L.Marlou Sa, MD Referring: Alroy Dust, Carlean Jews.Marlou Sa, MD Visit Date: 01/25/2023 Occupation: '@GUAROCC'$ @  Subjective:    History of Present Illness: Vanessa Collins is a 77 y.o. female with history of seropositive rheumatoid arthritis, osteoarthritis, and DDD. She remains on Methotrexate 0.5 ml sq inj once weekly, folic acid 1 mg daily, Plaquenil 200 mg 1 tablet by mouth BID M-F, and prednisone 10 mg po qd.   CBC and CMP updated on 09/14/22.  Overdue to update lab work.  Orders for CBC and CMP released today.  Her next lab work will be due in 3 months to monitor for drug toxicity. PLQ eye exam 10/27/2022 WNL Dr. Laurence Aly. Follow up in 1 year.  Discussed the importance of holding methotrexate if she develops signs or symptoms of an infection and to resume once the infection has completely cleared.  Activities of Daily Living:  Patient reports morning stiffness for *** {minute/hour:19697}.   Patient {ACTIONS;DENIES/REPORTS:21021675::"Denies"} nocturnal pain.  Difficulty dressing/grooming: {ACTIONS;DENIES/REPORTS:21021675::"Denies"} Difficulty climbing stairs: {ACTIONS;DENIES/REPORTS:21021675::"Denies"} Difficulty getting out of chair: {ACTIONS;DENIES/REPORTS:21021675::"Denies"} Difficulty using hands for taps, buttons, cutlery, and/or writing: {ACTIONS;DENIES/REPORTS:21021675::"Denies"}  No Rheumatology ROS completed.   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 10/09/2018 10/09/2018   History of depression 06/19/2017   Rheumatoid arthritis involving multiple sites with positive rheumatoid factor ([place])  01/25/2017   High risk medication use 01/25/2017   Immunosuppression ([place]) 01/25/2017   Fibromyalgia 01/25/2017   Primary osteoarthritis of both hands 01/25/2017   Spondylosis of lumbar region without myelopathy or radiculopathy 01/25/2017   DJD (degenerative joint disease), cervical 01/25/2017   History of neutropenia 01/25/2017   History of coronary artery disease 01/25/2017   Lapband APS April 2010 02/25/2013   Wears dentures     Past Medical History:  Diagnosis Date   Arthritis    rheumatoid , osteo    CAD (coronary artery disease)    PCI to LAD 2010   Diverticulitis    with perforation and colostomy placement     Fibromyalgia    Heart disease    Heart murmur    Hyperlipidemia    Hypertension    Myocardial infarction ([place]) 2008   one Stent   Shingles 03/2020   per patient    Vertigo    per patient    Vitamin D deficiency    Wears dentures    gum disease    Family History  Problem Relation Age of Onset   Macular degeneration Mother    Cancer Father    Rheum arthritis Father    Bipolar disorder Father    Bipolar disorder Daughter    Rheum arthritis Daughter    Past Surgical History:  Procedure Laterality Date   CESAREAN SECTION     COLECTOMY WITH COLOSTOMY CREATION/HARTMANN PROCEDURE  09/2017   Perforated diverticulitis   CORONARY STENT PLACEMENT  08/2009   EYE SURGERY     LAPAROSCOPIC GASTRIC BANDING  04/05/2009   Dr Hassell Done   PROCTOSCOPY N/A 10/09/2018  Procedure: RIDGID PROCTOSCOPY;  Surgeon: Michael Boston, MD;  Location: [facility];  Service: General;  Laterality: N/A;   VAGINAL HYSTERECTOMY     VENTRAL HERNIA REPAIR N/A 07/11/2019   Procedure: LAPAROSCOPIC ASSISTED VENTRAL HERNIA REPAIR;  Surgeon: Johnathan Hausen, MD;  Location: [facility];  Service: General;  Laterality: N/A;   Social History   Social History Narrative   Not on file   Immunization History  Administered Date(s) Administered   PFIZER(Purple Top)SARS-COV-2 Vaccination 02/22/2020,  04/07/2020     Objective: Vital Signs: There were no vitals taken for this visit.   Physical Exam Vitals and nursing note reviewed.  Constitutional:      Appearance: She is well-developed.  HENT:     Head: Normocephalic and atraumatic.  Eyes:     Conjunctiva/sclera: Conjunctivae normal.  Cardiovascular:     Rate and Rhythm: Normal rate and regular rhythm.     Heart sounds: Normal heart sounds.  Pulmonary:     Effort: Pulmonary effort is normal.     Breath sounds: Normal breath sounds.  Abdominal:     General: Bowel sounds are normal.     Palpations: Abdomen is soft.  Musculoskeletal:     Cervical back: Normal range of motion.  Skin:    General: Skin is warm and dry.     Capillary Refill: Capillary refill takes less than 2 seconds.  Neurological:     Mental Status: She is alert and oriented to person, place, and time.  Psychiatric:        Behavior: Behavior normal.      Musculoskeletal Exam: ***  CDAI Exam: CDAI Score: -- Patient Global: --; Provider Global: -- Swollen: --; Tender: -- Joint Exam 01/25/2023   No joint exam has been documented for this visit   There is currently no information documented on the homunculus. Go to the Rheumatology activity and complete the homunculus joint exam.  Investigation: No additional findings.  Imaging: No results found.  Recent Labs: Lab Results  Component Value Date   WBC 5.1 09/14/2022   HGB 12.3 09/14/2022   PLT 196 09/14/2022   NA 141 09/14/2022   K 4.4 09/14/2022   CL 102 09/14/2022   CO2 32 09/14/2022   GLUCOSE 95 09/14/2022   BUN 26 (H) 09/14/2022   CREATININE 1.27 (H) 09/14/2022   BILITOT 0.4 09/14/2022   ALKPHOS 121 07/29/2021   AST 23 09/14/2022   ALT 24 09/14/2022   PROT 6.1 09/14/2022   ALBUMIN 2.1 (L) 07/29/2021   CALCIUM 9.5 09/14/2022   GFRAA 72 04/26/2021    Speciality Comments: PLQ eye exam 10/27/2022 WNL Dr. Laurence Aly. Follow up in 1 year.   Prior therapy includes: Morrie Sheldon (GI  perforation), Humira (inadequate response), Enbrel (inadequate response), and Orencia (nausea and headaches)  Procedures:  No procedures performed Allergies: Vicodin [hydrocodone-acetaminophen] and Aspirin   Assessment / Plan:     Visit Diagnoses: Rheumatoid arthritis involving multiple sites with positive rheumatoid factor (HCC)  High risk medication use - Methotrexate 0.5 ml sq inj once weekly, folic acid 1 mg daily, Plaquenil 200 mg 1 tablet by mouth BID M-F, and prednisone 10 mg po qd.PLQ eye exam: 06/16/2021  Current chronic use of systemic steroids - prednisone 10 mg p.o. every morning.  Primary osteoarthritis of both hands  DDD (degenerative disc disease), cervical  DDD (degenerative disc disease), lumbar - Dr. Hardin Negus at the pain management clinic.  Other fatigue  Fibromyalgia  Vitamin D deficiency  Abnormal SPEP  History of hyperlipidemia  History of coronary artery disease  History of hypertension  Status post colostomy ([place])  Lapband APS April 2010  History of depression  Vertigo  Essential hypertension, benign  Orders: No orders of the defined types were placed in this encounter.  No orders of the defined types were placed in this encounter.   Face-to-face time spent with patient was *** minutes. Greater than 50% of time was spent in counseling and coordination of care.  Follow-Up Instructions: No follow-ups on file.   Ofilia Neas, PA-C  Note - This record has been created using Dragon software.  Chart creation errors have been sought, but may not always  have been located. Such creation errors do not reflect on  the standard of medical care.

## 2023-01-25 ENCOUNTER — Ambulatory Visit: Payer: Medicare Other | Admitting: Physician Assistant

## 2023-01-25 DIAGNOSIS — E559 Vitamin D deficiency, unspecified: Secondary | ICD-10-CM

## 2023-01-25 DIAGNOSIS — R5383 Other fatigue: Secondary | ICD-10-CM

## 2023-01-25 DIAGNOSIS — M797 Fibromyalgia: Secondary | ICD-10-CM

## 2023-01-25 DIAGNOSIS — M5136 Other intervertebral disc degeneration, lumbar region: Secondary | ICD-10-CM

## 2023-01-25 DIAGNOSIS — R42 Dizziness and giddiness: Secondary | ICD-10-CM

## 2023-01-25 DIAGNOSIS — I10 Essential (primary) hypertension: Secondary | ICD-10-CM

## 2023-01-25 DIAGNOSIS — Z8679 Personal history of other diseases of the circulatory system: Secondary | ICD-10-CM

## 2023-01-25 DIAGNOSIS — Z79899 Other long term (current) drug therapy: Secondary | ICD-10-CM

## 2023-01-25 DIAGNOSIS — Z9884 Bariatric surgery status: Secondary | ICD-10-CM

## 2023-01-25 DIAGNOSIS — R778 Other specified abnormalities of plasma proteins: Secondary | ICD-10-CM

## 2023-01-25 DIAGNOSIS — M5030 Other cervical disc degeneration, unspecified cervical region: Secondary | ICD-10-CM

## 2023-01-25 DIAGNOSIS — Z7952 Long term (current) use of systemic steroids: Secondary | ICD-10-CM

## 2023-01-25 DIAGNOSIS — M19041 Primary osteoarthritis, right hand: Secondary | ICD-10-CM

## 2023-01-25 DIAGNOSIS — Z8639 Personal history of other endocrine, nutritional and metabolic disease: Secondary | ICD-10-CM

## 2023-01-25 DIAGNOSIS — Z8659 Personal history of other mental and behavioral disorders: Secondary | ICD-10-CM

## 2023-01-25 DIAGNOSIS — Z933 Colostomy status: Secondary | ICD-10-CM

## 2023-01-25 DIAGNOSIS — M0579 Rheumatoid arthritis with rheumatoid factor of multiple sites without organ or systems involvement: Secondary | ICD-10-CM

## 2023-01-26 NOTE — Progress Notes (Unsigned)
Office Visit Note  Patient: Vanessa Collins             Date of Birth: 01-09-1946           MRN: [id number]             PCP: Alroy Dust, L.Marlou Sa, MD Referring: Alroy Dust, Carlean Jews.Marlou Sa, MD Visit Date: 02/06/2023 Occupation: @GUAROCC$ @  Subjective:  Right shoulder joint pain   History of Present Illness: RAQUEL LEMERISE is a 77 y.o. female with history of seropositive rheumatoid arthritis, osteoarthritis, and DDD. She remains on Methotrexate 0.3 ml sq inj once weekly, folic acid 1 mg daily, Plaquenil 200 mg 1 tablet by mouth BID M-F, and prednisone 10 mg po qd.   She is tolerating combination therapy without any side effects.  She states she recently held methotrexate for 3 weeks while 2 wounds on her legs healed.  She restarted methotrexate last week.  She reports she continues to have intermittent pain and stiffness in the right shoulder, both hands, both ankle joints.  She has been walking for exercise on a regular basis.  Patient requested to have a right shoulder cortisone injection today.  She states that her right shoulder was previously injected with cortisone about 20 years ago which provided relief until recently.  She is not having difficulty with range of motion and performing upper extremity muscle strengthening. She denies any new medical conditions. She continues to have vertigo which has been unchanged overall.      Activities of Daily Living:  Patient reports morning stiffness for 4 hour.   Patient Reports nocturnal pain.  Difficulty dressing/grooming: Reports Difficulty climbing stairs: Denies Difficulty getting out of chair: Denies Difficulty using hands for taps, buttons, cutlery, and/or writing: Reports  Review of Systems  Constitutional:  Positive for fatigue.  HENT:  Negative for mouth sores and mouth dryness.   Eyes:  Positive for dryness.  Respiratory:  Negative for shortness of breath.   Cardiovascular:  Negative for chest pain and palpitations.  Gastrointestinal:  Negative  for blood in stool, constipation and diarrhea.  Endocrine: Negative for increased urination.  Genitourinary:  Negative for involuntary urination.  Musculoskeletal:  Positive for joint pain, gait problem, joint pain, joint swelling, myalgias, muscle weakness, morning stiffness, muscle tenderness and myalgias.  Skin: Negative.  Negative for color change, rash, hair loss and sensitivity to sunlight.  Allergic/Immunologic: Negative.  Negative for susceptible to infections.  Neurological:  Positive for dizziness. Negative for headaches.  Hematological: Negative.  Negative for swollen glands.  Psychiatric/Behavioral:  Negative for depressed mood and sleep disturbance. The patient is nervous/anxious.     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 10/09/2018 10/09/2018   History of depression 06/19/2017   Rheumatoid arthritis involving multiple sites with positive rheumatoid factor ([place]) 01/25/2017   High risk medication use 01/25/2017   Immunosuppression ([place]) 01/25/2017   Fibromyalgia 01/25/2017   Primary osteoarthritis of both hands 01/25/2017   Spondylosis of lumbar region without myelopathy or radiculopathy 01/25/2017   DJD (degenerative joint disease), cervical 01/25/2017   History of neutropenia 01/25/2017   History of coronary artery disease 01/25/2017   Lapband APS April 2010 02/25/2013   Wears dentures     Past Medical History:  Diagnosis Date  Arthritis    rheumatoid , osteo    CAD (coronary artery disease)    PCI to LAD 2010   Diverticulitis    with perforation and colostomy placement     Fibromyalgia    Heart disease    Heart murmur    Hyperlipidemia    Hypertension    Myocardial infarction [facility])  2008   one Stent   Shingles 03/2020   per patient    Vertigo    per patient    Vitamin D deficiency    Wears dentures    gum disease    Family History  Problem Relation Age of Onset   Macular degeneration Mother    Cancer Father    Rheum arthritis Father    Bipolar disorder Father    Bipolar disorder Daughter    Rheum arthritis Daughter    Past Surgical History:  Procedure Laterality Date   CESAREAN SECTION     COLECTOMY WITH COLOSTOMY CREATION/HARTMANN PROCEDURE  09/2017   Perforated diverticulitis   CORONARY STENT PLACEMENT  08/2009   EYE SURGERY     LAPAROSCOPIC GASTRIC BANDING  04/05/2009   Dr Hassell Done   PROCTOSCOPY N/A 10/09/2018   Procedure: RIDGID PROCTOSCOPY;  Surgeon: Michael Boston, MD;  Location: [facility];  Service: General;  Laterality: N/A;   VAGINAL HYSTERECTOMY     VENTRAL HERNIA REPAIR N/A 07/11/2019   Procedure: LAPAROSCOPIC ASSISTED VENTRAL HERNIA REPAIR;  Surgeon: Johnathan Hausen, MD;  Location: [facility];  Service: General;  Laterality: N/A;   Social History   Social History Narrative   Not on file   Immunization History  Administered Date(s) Administered   PFIZER(Purple Top)SARS-COV-2 Vaccination 02/22/2020, 04/07/2020     Objective: Vital Signs: BP 104/71 (BP Location: Left Arm, Patient Position: Sitting, Cuff Size: Normal)   Pulse 67   Resp 14   Ht 5' 3"$  (1.6 m)   Wt 155 lb 6.4 oz (70.5 kg)   BMI 27.53 kg/m    Physical Exam Vitals and nursing note reviewed.  Constitutional:      Appearance: She is well-developed.  HENT:     Head: Normocephalic and atraumatic.  Eyes:     Conjunctiva/sclera: Conjunctivae normal.  Cardiovascular:     Rate and Rhythm: Normal rate and regular rhythm.     Heart sounds: Normal heart sounds.  Pulmonary:     Effort: Pulmonary effort is normal.     Breath sounds: Normal breath sounds.  Abdominal:     General: Bowel sounds are normal.     Palpations: Abdomen is soft.  Musculoskeletal:     Cervical back: Normal  range of motion.  Skin:    General: Skin is warm and dry.     Capillary Refill: Capillary refill takes less than 2 seconds.  Neurological:     Mental Status: She is alert and oriented to person, place, and time.  Psychiatric:        Behavior: Behavior normal.      Musculoskeletal Exam: C-spine has slightly limited range of motion with lateral rotation.  Good flexion extension of the C-spine.  Discomfort with lumbar range of motion.  Shoulder joints have good range of motion with discomfort in the right shoulder.  Elbow joints, wrist joints, MCPs, PIPs, DIPs have good range of motion with no synovitis.  PIP and DIP thickening consistent with osteoarthritis of both hands.  CMC joint prominence noted bilaterally.  Hip joints have good range of motion with no groin pain.  Knee joints have good  range of motion with no warmth or effusion.  Synovial thickening of both ankle joints with warmth in the left ankle. CDAI Exam: CDAI Score: -- Patient Global: 7 mm; Provider Global: 5 mm Swollen: --; Tender: -- Joint Exam 02/06/2023   No joint exam has been documented for this visit   There is currently no information documented on the homunculus. Go to the Rheumatology activity and complete the homunculus joint exam.  Investigation: No additional findings.  Imaging: No results found.  Recent Labs: Lab Results  Component Value Date   WBC 5.1 09/14/2022   HGB 12.3 09/14/2022   PLT 196 09/14/2022   NA 141 09/14/2022   K 4.4 09/14/2022   CL 102 09/14/2022   CO2 32 09/14/2022   GLUCOSE 95 09/14/2022   BUN 26 (H) 09/14/2022   CREATININE 1.27 (H) 09/14/2022   BILITOT 0.4 09/14/2022   ALKPHOS 121 07/29/2021   AST 23 09/14/2022   ALT 24 09/14/2022   PROT 6.1 09/14/2022   ALBUMIN 2.1 (L) 07/29/2021   CALCIUM 9.5 09/14/2022   GFRAA 72 04/26/2021    Speciality Comments: PLQ eye exam 10/27/2022 WNL Dr. Laurence Aly. Follow up in 1 year.   Prior therapy includes: Morrie Sheldon (GI perforation),  Humira (inadequate response), Enbrel (inadequate response), and Orencia (nausea and headaches)  Procedures:  Large Joint Inj: R glenohumeral on 02/06/2023 3:17 PM Indications: pain Details: 27 G 1.5 in needle, posterior approach  Arthrogram: No  Medications: 1.5 mL lidocaine 1 %; 40 mg triamcinolone acetonide 40 MG/ML Aspirate: 0 mL Outcome: tolerated well, no immediate complications Procedure, treatment alternatives, risks and benefits explained, specific risks discussed. Consent was given by the patient. Immediately prior to procedure a time out was called to verify the correct patient, procedure, equipment, support staff and site/side marked as required. Patient was prepped and draped in the usual sterile fashion.     Allergies: Vicodin [hydrocodone-acetaminophen] and Aspirin   Assessment / Plan:     Visit Diagnoses: Rheumatoid arthritis involving multiple sites with positive rheumatoid factor [facility]): Patient presents today with increased pain and stiffness in the right shoulder.  X-rays of the right shoulder were obtained today for further evaluation.  After informed consent the right glenohumeral joint was injected with cortisone as requested.  She continues to experience intermittent pain and stiffness in both hands and both ankle joints.  On exam she has warmth in the left ankle.  She remains on methotrexate 0.3 mL subcu injections once weekly along with folic acid 1 mg daily and Plaquenil 200 mg 1 tablet by mouth twice daily Monday through Friday.  She has been alternating prednisone 5 mg with 10 mg every other day and remains aware of risks of long-term prednisone use.  No medication changes will be made at this time.  Joint protection and muscle strengthening were discussed.  She was advised to notify us if her right shoulder joint pain persists or worsens.  She will follow-up in the office in 3 months or sooner if needed.  High risk medication use - Methotrexate 0.3 ml sq inj once  weekly, folic acid 1 mg daily, Plaquenil 200 mg 1 tablet by mouth BID M-F, and prednisone 5 mg alternating with 10 mg every other day.  CBC and CMP updated on 09/14/22.  Orders for CBC and CMP were released today.  Her next lab work will be due in May and every 3 months to monitor for drug toxicity. PLQ eye exam 10/27/2022 WNL Dr. Laurence Aly. Follow up  in 1 year.  Discussed the importance of holding methotrexate if she develops signs or symptoms of an infection and to resume once the infection has completely cleared. She is aware of the risks of long-term prednisone use.  Plan: COMPLETE METABOLIC PANEL WITH GFR, CBC with Differential/Platelet  Current chronic use of systemic steroids - She has been taking prednisone 5 mg alternating with 10 mg every other day.  She tried to reduce prednisone to 5 mg daily but has been unable to do so.  She is aware of the risks of long-term prednisone use.  Chronic right shoulder pain - She presents today with increased pain and stiffness in the right shoulder.  She has not had any recent injury or fall.  She has been having difficulty with weight lifting and overhead activities due to the discomfort in her right shoulder.  X-rays of the right shoulder were obtained today for further evaluation.  Different treatment options were discussed.  She has tried physical therapy in the past.  According to the patient she had a right shoulder injection about 20 years ago which resolved her symptoms until recently.  She requested a repeat cortisone injection today.  She tolerated procedure well.  Procedure note was completed above.  Aftercare was discussed.  She was advised to notify us if her symptoms persist or worsen. Plan: XR Shoulder Right  Primary osteoarthritis of both hands: She has PIP and DIP thickening consistent with osteoarthritis of both hands.  CMC joint prominence noted bilaterally.  No active inflammation noted.   DDD (degenerative disc disease), cervical: C-spine  has slightly limited range of motion with lateral rotation.  Good flexion and extension of the C-spine.    DDD (degenerative disc disease), lumbar - She goes to Dr. Hardin Negus at the pain management clinic.  Fibromyalgia: She experiences intermittent myalgias and muscle tenderness due to fibromyalgia.  Discussed the importance of regular exercise and good sleep hygiene.  Other fatigue: Stable.  She has been walking on a regular basis for exercise.  Vitamin D deficiency - DEXA scan was normal on August 09, 2021.  She is taking vitamin D 5000 units daily.  Other medical conditions are listed as follows:  Abnormal SPEP  History of coronary artery disease  History of hyperlipidemia  History of depression  Lapband APS April 2010  Status post colostomy [facility])  History of hypertension: Blood pressure is 104/71 today in the office.  Vertigo    Orders: Orders Placed This Encounter  Procedures   Large Joint Inj   XR Shoulder Right   COMPLETE METABOLIC PANEL WITH GFR   CBC with Differential/Platelet   No orders of the defined types were placed in this encounter.   Follow-Up Instructions: Return in about 3 months (around 05/07/2023) for Rheumatoid arthritis, Osteoarthritis, DDD.   Ofilia Neas, PA-C  Note - This record has been created using Dragon software.  Chart creation errors have been sought, but may not always  have been located. Such creation errors do not reflect on  the standard of medical care.

## 2023-02-06 ENCOUNTER — Encounter: Payer: Self-pay | Admitting: Physician Assistant

## 2023-02-06 ENCOUNTER — Ambulatory Visit (INDEPENDENT_AMBULATORY_CARE_PROVIDER_SITE_OTHER): Payer: Medicare Other

## 2023-02-06 ENCOUNTER — Ambulatory Visit: Payer: Medicare Other | Attending: Physician Assistant | Admitting: Physician Assistant

## 2023-02-06 VITALS — BP 104/71 | HR 67 | Resp 14 | Ht 63.0 in | Wt 155.4 lb

## 2023-02-06 DIAGNOSIS — G8929 Other chronic pain: Secondary | ICD-10-CM | POA: Diagnosis not present

## 2023-02-06 DIAGNOSIS — E559 Vitamin D deficiency, unspecified: Secondary | ICD-10-CM | POA: Diagnosis not present

## 2023-02-06 DIAGNOSIS — R5383 Other fatigue: Secondary | ICD-10-CM

## 2023-02-06 DIAGNOSIS — M5136 Other intervertebral disc degeneration, lumbar region: Secondary | ICD-10-CM | POA: Diagnosis not present

## 2023-02-06 DIAGNOSIS — M19041 Primary osteoarthritis, right hand: Secondary | ICD-10-CM | POA: Diagnosis not present

## 2023-02-06 DIAGNOSIS — R778 Other specified abnormalities of plasma proteins: Secondary | ICD-10-CM | POA: Diagnosis not present

## 2023-02-06 DIAGNOSIS — Z79899 Other long term (current) drug therapy: Secondary | ICD-10-CM | POA: Diagnosis not present

## 2023-02-06 DIAGNOSIS — M5030 Other cervical disc degeneration, unspecified cervical region: Secondary | ICD-10-CM

## 2023-02-06 DIAGNOSIS — M0579 Rheumatoid arthritis with rheumatoid factor of multiple sites without organ or systems involvement: Secondary | ICD-10-CM | POA: Diagnosis not present

## 2023-02-06 DIAGNOSIS — Z7952 Long term (current) use of systemic steroids: Secondary | ICD-10-CM

## 2023-02-06 DIAGNOSIS — M19042 Primary osteoarthritis, left hand: Secondary | ICD-10-CM

## 2023-02-06 DIAGNOSIS — M25511 Pain in right shoulder: Secondary | ICD-10-CM

## 2023-02-06 DIAGNOSIS — E785 Hyperlipidemia, unspecified: Secondary | ICD-10-CM | POA: Diagnosis not present

## 2023-02-06 DIAGNOSIS — Z8659 Personal history of other mental and behavioral disorders: Secondary | ICD-10-CM

## 2023-02-06 DIAGNOSIS — Z8639 Personal history of other endocrine, nutritional and metabolic disease: Secondary | ICD-10-CM

## 2023-02-06 DIAGNOSIS — M797 Fibromyalgia: Secondary | ICD-10-CM | POA: Diagnosis not present

## 2023-02-06 DIAGNOSIS — Z8679 Personal history of other diseases of the circulatory system: Secondary | ICD-10-CM | POA: Diagnosis not present

## 2023-02-06 DIAGNOSIS — E039 Hypothyroidism, unspecified: Secondary | ICD-10-CM | POA: Diagnosis not present

## 2023-02-06 DIAGNOSIS — I10 Essential (primary) hypertension: Secondary | ICD-10-CM | POA: Diagnosis not present

## 2023-02-06 DIAGNOSIS — I2510 Atherosclerotic heart disease of native coronary artery without angina pectoris: Secondary | ICD-10-CM | POA: Diagnosis not present

## 2023-02-06 DIAGNOSIS — Z933 Colostomy status: Secondary | ICD-10-CM

## 2023-02-06 DIAGNOSIS — R42 Dizziness and giddiness: Secondary | ICD-10-CM

## 2023-02-06 DIAGNOSIS — Z9884 Bariatric surgery status: Secondary | ICD-10-CM

## 2023-02-06 MED ORDER — LIDOCAINE HCL 1 % IJ SOLN
1.5000 mL | INTRAMUSCULAR | Status: AC | PRN
  Administered 2023-02-06: 1.5 mL

## 2023-02-06 MED ORDER — TRIAMCINOLONE ACETONIDE 40 MG/ML IJ SUSP
40.0000 mg | INTRAMUSCULAR | Status: AC | PRN
  Administered 2023-02-06: 40 mg via INTRA_ARTICULAR

## 2023-02-06 NOTE — Patient Instructions (Addendum)
Standing Labs We placed an order today for your standing lab work.   Please have your standing labs drawn in May and every 3 months   Please have your labs drawn 2 weeks prior to your appointment so that the provider can discuss your lab results at your appointment.  Please note that you may see your imaging and lab results in [place] before we have reviewed them. We will contact you once all results are reviewed. Please allow our office up to 72 hours to thoroughly review all of the results before contacting the office for clarification of your results.  Lab hours are:   Monday through Thursday from 8:00 am -12:30 pm and 1:00 pm-5:00 pm and Friday from 8:00 am-12:00 pm.  Please be advised, all patients with office appointments requiring lab work will take precedent over walk-in lab work.   Labs are drawn by [facility]. Please bring your co-pay at the time of your lab draw.  You may receive a bill from [facility] for your lab work.  Please note if you are on Hydroxychloroquine and and an order has been placed for a Hydroxychloroquine level, you will need to have it drawn 4 hours or more after your last dose.  If you wish to have your labs drawn at another location, please call the office 24 hours in advance so we can fax the orders.  The office is located at [address], [place], [place], [place] [address] No appointment is necessary.    If you have any questions regarding directions or hours of operation,  please call [phone].   As a reminder, please drink plenty of water prior to coming for your lab work. Thanks!  If you have signs or symptoms of an infection or start antibiotics: First, call your PCP for workup of your infection. Hold your medication through the infection, until you complete your antibiotics, and until symptoms resolve if you take the following: Injectable medication (Actemra, Benlysta, Cimzia, Cosentyx, Enbrel, Humira, Kevzara, Orencia, Remicade, Simponi,  Stelara, Taltz, Tremfya) Methotrexate Leflunomide (Arava) Mycophenolate (Cellcept) Morrie Sheldon, Olumiant, or Rinvoq

## 2023-02-07 ENCOUNTER — Other Ambulatory Visit: Payer: Self-pay | Admitting: Physician Assistant

## 2023-02-07 LAB — COMPLETE METABOLIC PANEL WITH GFR
AG Ratio: 1.7 (calc) (ref 1.0–2.5)
ALT: 20 U/L (ref 6–29)
AST: 24 U/L (ref 10–35)
Albumin: 4 g/dL (ref 3.6–5.1)
Alkaline phosphatase (APISO): 34 U/L — ABNORMAL LOW (ref 37–153)
BUN/Creatinine Ratio: 21 (calc) (ref 6–22)
BUN: 23 mg/dL (ref 7–25)
CO2: 33 mmol/L — ABNORMAL HIGH (ref 20–32)
Calcium: 9.7 mg/dL (ref 8.6–10.4)
Chloride: 101 mmol/L (ref 98–110)
Creat: 1.12 mg/dL — ABNORMAL HIGH (ref 0.60–1.00)
Globulin: 2.3 g/dL (calc) (ref 1.9–3.7)
Glucose, Bld: 78 mg/dL (ref 65–99)
Potassium: 3.7 mmol/L (ref 3.5–5.3)
Sodium: 142 mmol/L (ref 135–146)
Total Bilirubin: 0.6 mg/dL (ref 0.2–1.2)
Total Protein: 6.3 g/dL (ref 6.1–8.1)
eGFR: 52 mL/min/{1.73_m2} — ABNORMAL LOW (ref >=60)

## 2023-02-07 LAB — CBC WITH DIFFERENTIAL/PLATELET
Absolute Monocytes: 290 cells/uL (ref 200–950)
Basophils Absolute: 82 cells/uL (ref 0–200)
Basophils Relative: 1.3 %
Eosinophils Absolute: 32 cells/uL (ref 15–500)
Eosinophils Relative: 0.5 %
HCT: 37.2 % (ref 35.0–45.0)
Hemoglobin: 12.8 g/dL (ref 11.7–15.5)
Lymphs Abs: 1147 cells/uL (ref 850–3900)
MCH: 34.2 pg — ABNORMAL HIGH (ref 27.0–33.0)
MCHC: 34.4 g/dL (ref 32.0–36.0)
MCV: 99.5 fL (ref 80.0–100.0)
MPV: 11.4 fL (ref 7.5–12.5)
Monocytes Relative: 4.6 %
Neutro Abs: 4750 cells/uL (ref 1500–7800)
Neutrophils Relative %: 75.4 %
Platelets: 217 10*3/uL (ref 140–400)
RBC: 3.74 10*6/uL — ABNORMAL LOW (ref 3.80–5.10)
RDW: 12.6 % (ref 11.0–15.0)
Total Lymphocyte: 18.2 %
WBC: 6.3 10*3/uL (ref 3.8–10.8)

## 2023-02-07 NOTE — Progress Notes (Signed)
CBC stable.  Creatinine remains elevated but has improved.  GFR is low-52. Avoid NSAID use. Alk phos remains borderline low but stable.   We will continue to monitor.

## 2023-02-07 NOTE — Telephone Encounter (Signed)
Next Visit: 05/08/2023  Last Visit: 02/06/2023  Last Fill: 11/10/2022  DX: Rheumatoid arthritis involving multiple sites with positive rheumatoid factor   Current Dose per office note 02/06/2023:  Methotrexate 0.3 ml sq inj once weekly,   Labs: 02/06/2022  CBC stable. Creatinine remains elevated but has improved. GFR is low-52. Avoid NSAID use. Alk phos remains borderline low but stable.   We will continue to monitor.  Okay to refill MTX?

## 2023-02-12 ENCOUNTER — Other Ambulatory Visit: Payer: Self-pay | Admitting: Physician Assistant

## 2023-02-12 NOTE — Telephone Encounter (Signed)
Next Visit: 05/08/2023   Last Visit: 02/06/2023   Last Fill: 02/23/2022  DX: Rheumatoid arthritis involving multiple sites with positive rheumatoid factor    Okay to refill Syringes?

## 2023-02-21 DIAGNOSIS — Z79891 Long term (current) use of opiate analgesic: Secondary | ICD-10-CM | POA: Diagnosis not present

## 2023-02-21 DIAGNOSIS — M0579 Rheumatoid arthritis with rheumatoid factor of multiple sites without organ or systems involvement: Secondary | ICD-10-CM | POA: Diagnosis not present

## 2023-02-21 DIAGNOSIS — M150 Primary generalized (osteo)arthritis: Secondary | ICD-10-CM | POA: Diagnosis not present

## 2023-02-21 DIAGNOSIS — G894 Chronic pain syndrome: Secondary | ICD-10-CM | POA: Diagnosis not present

## 2023-02-28 ENCOUNTER — Other Ambulatory Visit: Payer: Self-pay | Admitting: Physician Assistant

## 2023-02-28 DIAGNOSIS — M0579 Rheumatoid arthritis with rheumatoid factor of multiple sites without organ or systems involvement: Secondary | ICD-10-CM

## 2023-03-28 ENCOUNTER — Other Ambulatory Visit: Payer: Self-pay | Admitting: Physician Assistant

## 2023-03-28 DIAGNOSIS — M0579 Rheumatoid arthritis with rheumatoid factor of multiple sites without organ or systems involvement: Secondary | ICD-10-CM

## 2023-03-28 MED ORDER — HYDROXYCHLOROQUINE SULFATE 200 MG PO TABS: ORAL_TABLET | ORAL | 0 refills | Status: DC

## 2023-03-28 NOTE — Telephone Encounter (Signed)
Last Fill: 01/05/2023  Eye exam: 10/27/2022 WNL   Labs: 02/06/2023 CBC stable. Creatinine remains elevated but has improved. GFR is low-52. Alk phos remains borderline low but stable.   Next Visit: 05/08/2023  Last Visit: 02/06/2023  DX: Rheumatoid arthritis involving multiple sites with positive rheumatoid factor   Current Dose per office note 02/06/2023: Plaquenil 200 mg 1 tablet by mouth BID M-F   Okay to refill Plaquenil?

## 2023-03-28 NOTE — Telephone Encounter (Signed)
Last Fill: 06/20/2022  Next Visit: 05/08/2023  Last Visit: 02/06/2023  Dx: Rheumatoid arthritis involving multiple sites with positive rheumatoid factor   Current Dose per office note on [id number]: folic acid 1 mg daily,   Okay to refill Folic Acid?

## 2023-03-29 ENCOUNTER — Other Ambulatory Visit: Payer: Self-pay | Admitting: Physician Assistant

## 2023-04-30 ENCOUNTER — Other Ambulatory Visit: Payer: Self-pay | Admitting: Rheumatology

## 2023-04-30 NOTE — Progress Notes (Signed)
Office Visit Note  Patient: Vanessa Collins             Date of Birth: [birth year]           MRN: [id number]             PCP: Clovis Riley, L.August Saucer, MD Referring: Clovis Riley, Elbert Ewings.August Saucer, MD Visit Date: 05/08/2023 Occupation: @GUAROCC @  Subjective:  Medication management  History of Present Illness: Vanessa Collins is a 77 y.o. female with history of seropositive rheumatoid arthritis, osteoarthritis and degenerative disc disease.  She states that she continues to have some pain and discomfort.  She noticed occasional swelling in her right second MCP joint.  She continues to be on methotrexate 0.3 mL subcu weekly along with folic acid, Plaquenil 200 mg twice daily Monday to Friday and prednisone 10 mg alternating with 5 mg every other day.  She states the combination has been working well for her.  She denies any side effects from the medications.    Activities of Daily Living:  Patient reports morning stiffness for 4 hours.   Patient Reports nocturnal pain.  Difficulty dressing/grooming: Reports Difficulty climbing stairs: Denies Difficulty getting out of chair: Reports Difficulty using hands for taps, buttons, cutlery, and/or writing: Reports  Review of Systems  Constitutional:  Positive for fatigue.  HENT:  Negative for mouth sores and mouth dryness.   Eyes:  Positive for dryness.  Respiratory:  Negative for shortness of breath.   Cardiovascular:  Negative for chest pain and palpitations.  Gastrointestinal:  Negative for blood in stool, constipation and diarrhea.  Endocrine: Negative for increased urination.  Genitourinary:  Positive for involuntary urination.  Musculoskeletal:  Positive for joint pain, gait problem, joint pain, joint swelling, myalgias, muscle weakness, morning stiffness, muscle tenderness and myalgias.  Skin:  Negative for color change, rash, hair loss and sensitivity to sunlight.  Allergic/Immunologic: Negative for susceptible to infections.  Neurological:  Positive for  dizziness and headaches.  Hematological:  Negative for swollen glands.  Psychiatric/Behavioral:  Negative for depressed mood and sleep disturbance. The patient is nervous/anxious.     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 10/09/2018 10/09/2018   History of depression 06/19/2017   Rheumatoid arthritis involving multiple sites with positive rheumatoid factor (HCC) 01/25/2017   High risk medication use 01/25/2017   Immunosuppression (HCC) 01/25/2017   Fibromyalgia 01/25/2017   Primary osteoarthritis of both hands 01/25/2017   Spondylosis of lumbar region without myelopathy or radiculopathy 01/25/2017   DJD (degenerative joint disease), cervical 01/25/2017   History of neutropenia 01/25/2017   History of coronary artery disease 01/25/2017   Lapband APS April 2010 02/25/2013   Wears dentures     Past Medical History:  Diagnosis Date   Arthritis    rheumatoid , osteo    CAD (coronary artery disease)    PCI to LAD 2010   Diverticulitis    with perforation and colostomy placement     Fibromyalgia    Heart disease    Heart murmur    Hyperlipidemia    Hypertension    Myocardial infarction [facility]) 2008   one Stent   Shingles 03/2020   per patient    Vertigo    per patient    Vitamin D  deficiency    Wears dentures    gum disease    Family History  Problem Relation Age of Onset   Macular degeneration Mother    Cancer Father    Rheum arthritis Father    Bipolar disorder Father    Bipolar disorder Daughter    Rheum arthritis Daughter    Past Surgical History:  Procedure Laterality Date   CESAREAN SECTION     COLECTOMY WITH COLOSTOMY CREATION/HARTMANN PROCEDURE  09/2017   Perforated diverticulitis    CORONARY STENT PLACEMENT  08/2009   EYE SURGERY     LAPAROSCOPIC GASTRIC BANDING  04/05/2009   Dr Daphine Deutscher   PROCTOSCOPY N/A 10/09/2018   Procedure: RIDGID PROCTOSCOPY;  Surgeon: Karie Soda, MD;  Location: [facility];  Service: General;  Laterality: N/A;   VAGINAL HYSTERECTOMY     VENTRAL HERNIA REPAIR N/A 07/11/2019   Procedure: LAPAROSCOPIC ASSISTED VENTRAL HERNIA REPAIR;  Surgeon: Luretha Murphy, MD;  Location: [facility];  Service: General;  Laterality: N/A;   Social History   Social History Narrative   Not on file   Immunization History  Administered Date(s) Administered   PFIZER(Purple Top)SARS-COV-2 Vaccination 02/22/2020, 04/07/2020     Objective: Vital Signs: BP 111/78 (BP Location: Right Arm, Patient Position: Sitting, Cuff Size: Normal)   Pulse 65   Resp 14   Ht 5\' 3"  (1.6 m)   Wt 153 lb (69.4 kg)   BMI 27.10 kg/m    Physical Exam Vitals and nursing note reviewed.  Constitutional:      Appearance: She is well-developed.  HENT:     Head: Normocephalic and atraumatic.  Eyes:     Conjunctiva/sclera: Conjunctivae normal.  Cardiovascular:     Rate and Rhythm: Normal rate and regular rhythm.     Heart sounds: Normal heart sounds.  Pulmonary:     Effort: Pulmonary effort is normal.     Breath sounds: Normal breath sounds.  Abdominal:     General: Bowel sounds are normal.     Palpations: Abdomen is soft.  Musculoskeletal:     Cervical back: Normal range of motion.  Lymphadenopathy:     Cervical: No cervical adenopathy.  Skin:    General: Skin is warm and dry.     Capillary Refill: Capillary refill takes less than 2 seconds.  Neurological:     Mental Status: She is alert and oriented to person, place, and time.  Psychiatric:        Behavior: Behavior normal.      Musculoskeletal Exam: She has some limitation with lateral rotation of the cervical spine.  Mild thoracic kyphosis was noted.  There was no tenderness over thoracic or lumbar spine.  Shoulders and elbows  were in good range of motion.  Wrist joints, MCPs PIPs and DIPs in good range of motion.  She has synovial thickening over right second MCP joint.  CMC, PIP and DIP thickening was noted.  Hip joints and knee joints in good range of motion without any warmth swelling or effusion.  There was no tenderness over ankles with bilateral ankle joint thickening was noted.  There was no tenderness over MTPs.  CDAI Exam: CDAI Score: 2.1  Patient Global: 7 mm; Provider Global: 4 mm Swollen: 1 ; Tender: 0  Joint Exam 05/08/2023      Right  Left  MCP 2  Swollen         Investigation: No additional findings.  Imaging: No results found.  Recent Labs: Lab Results  Component Value Date  WBC 6.3 02/06/2023   HGB 12.8 02/06/2023   PLT 217 02/06/2023   NA 142 02/06/2023   K 3.7 02/06/2023   CL 101 02/06/2023   CO2 33 (H) 02/06/2023   GLUCOSE 78 02/06/2023   BUN 23 02/06/2023   CREATININE 1.12 (H) 02/06/2023   BILITOT 0.6 02/06/2023   ALKPHOS 121 07/29/2021   AST 24 02/06/2023   ALT 20 02/06/2023   PROT 6.3 02/06/2023   ALBUMIN 2.1 (L) 07/29/2021   CALCIUM 9.7 02/06/2023   GFRAA 72 04/26/2021    Speciality Comments: PLQ eye exam 10/27/2022 WNL Dr. Ander Purpura. Follow up in 1 year.   Prior therapy includes: Harriette Ohara (GI perforation), Humira (inadequate response), Enbrel (inadequate response), and Orencia (nausea and headaches)  Procedures:  No procedures performed Allergies: Vicodin [hydrocodone-acetaminophen] and Aspirin   Assessment / Plan:     Visit Diagnoses: Rheumatoid arthritis involving multiple sites with positive rheumatoid factor (HCC)-patient continues to have some pain and discomfort in her joints.  She complains of intermittent discomfort in her right shoulder, bilateral hands and ankles.  Synovial thickening was noted over the right second MCP joints and ankle joints.  No synovitis was noted on the examination.  She continues to take combination of methotrexate, folic acid,  Plaquenil and prednisone.  She denies any side effects from the medications.  She states this combination has been working well for her.  High risk medication use - Methotrexate 0.3 ml sq inj once weekly, folic acid 1 mg daily, Plaquenil 200 mg 1 tablet by mouth BID M-F,prednisone 5 mg alternating with 10 mg every other day -Labs obtained on February 06, 2023 CBC was normal, CMP showed elevated creatinine to 1.12 which has been stable.  Will check labs today.  She will be getting labs every 3 months.  Information about immunization was placed in the AVS.  She was advised to hold methotrexate if she develops an infection resume after the infection resolves.  Plan: CBC with Differential/Platelet, COMPLETE METABOLIC PANEL WITH GFR  Current chronic use of systemic steroids - She has been taking prednisone 5 mg alternating with 10 mg every other day.  She is aware of the long-term side effects of prednisone including weight gain, osteoporosis, diabetes, hypertension, heart disease and cataracts.  Chronic right shoulder pain-she has intermittent discomfort in her right shoulder joint.  She had good range of motion without discomfort today.  Primary osteoarthritis of both hands-she had right second MCP thickening.  She had bilateral PIP and DIP prominence consistent with osteoarthritis.  DDD (degenerative disc disease), cervical-she has some stiffness with range of motion without discomfort.  DDD (degenerative disc disease), lumbar -she continues to have lower back pain.  She is followed by Dr. Vear [facility] pain management clinic.  Fibromyalgia-she continues to have some generalized pain and discomfort from fibromyalgia.  She had positive tender points.  Other fatigue-chronic fatigue due to fibromyalgia.  Other medical problems are listed as follows:  Abnormal SPEP  Vitamin D deficiency - DEXA scan was normal on August 09, 2021.  Plan to get repeat DEXA scan in September 2024.  History of  hyperlipidemia-increased risk of heart disease with rheumatoid arthritis was discussed.  Dietary modifications and exercise was emphasized.  Patient has been exercising on a regular basis.  History of hypertension-blood pressure was normal at 111/78 today.  Lapband APS April 2010  History of depression  Status post colostomy [facility])  History of coronary artery disease  Vertigo  Orders: Orders Placed This Encounter  Procedures  CBC with Differential/Platelet   COMPLETE METABOLIC PANEL WITH GFR   No orders of the defined types were placed in this encounter.    Follow-Up Instructions: Return in about 5 months (around 10/08/2023) for Rheumatoid arthritis, Osteoarthritis.   Pollyann Savoy, MD  Note - This record has been created using Animal nutritionist.  Chart creation errors have been sought, but may not always  have been located. Such creation errors do not reflect on  the standard of medical care.

## 2023-04-30 NOTE — Telephone Encounter (Signed)
Last Fill: 08/03/2022  Next Visit: 05/08/2023  Last Visit: 02/06/2023  Dx: Rheumatoid arthritis involving multiple sites with positive rheumatoid factor   Current Dose per office note on 04/30/2023: alternating prednisone 5 mg with 10 mg every other day   Okay to refill prednisone?

## 2023-05-08 ENCOUNTER — Encounter: Payer: Self-pay | Admitting: Rheumatology

## 2023-05-08 ENCOUNTER — Ambulatory Visit: Payer: Medicare Other | Attending: Rheumatology | Admitting: Rheumatology

## 2023-05-08 VITALS — BP 111/78 | HR 65 | Resp 14 | Ht 63.0 in | Wt 153.0 lb

## 2023-05-08 DIAGNOSIS — M19041 Primary osteoarthritis, right hand: Secondary | ICD-10-CM

## 2023-05-08 DIAGNOSIS — I10 Essential (primary) hypertension: Secondary | ICD-10-CM | POA: Diagnosis not present

## 2023-05-08 DIAGNOSIS — Z79899 Other long term (current) drug therapy: Secondary | ICD-10-CM

## 2023-05-08 DIAGNOSIS — Z8639 Personal history of other endocrine, nutritional and metabolic disease: Secondary | ICD-10-CM

## 2023-05-08 DIAGNOSIS — Z9884 Bariatric surgery status: Secondary | ICD-10-CM

## 2023-05-08 DIAGNOSIS — I2510 Atherosclerotic heart disease of native coronary artery without angina pectoris: Secondary | ICD-10-CM | POA: Diagnosis not present

## 2023-05-08 DIAGNOSIS — M5136 Other intervertebral disc degeneration, lumbar region: Secondary | ICD-10-CM | POA: Diagnosis not present

## 2023-05-08 DIAGNOSIS — E559 Vitamin D deficiency, unspecified: Secondary | ICD-10-CM

## 2023-05-08 DIAGNOSIS — R5383 Other fatigue: Secondary | ICD-10-CM

## 2023-05-08 DIAGNOSIS — M0579 Rheumatoid arthritis with rheumatoid factor of multiple sites without organ or systems involvement: Secondary | ICD-10-CM

## 2023-05-08 DIAGNOSIS — Z7952 Long term (current) use of systemic steroids: Secondary | ICD-10-CM | POA: Diagnosis not present

## 2023-05-08 DIAGNOSIS — M5030 Other cervical disc degeneration, unspecified cervical region: Secondary | ICD-10-CM

## 2023-05-08 DIAGNOSIS — Z933 Colostomy status: Secondary | ICD-10-CM

## 2023-05-08 DIAGNOSIS — M797 Fibromyalgia: Secondary | ICD-10-CM

## 2023-05-08 DIAGNOSIS — E782 Mixed hyperlipidemia: Secondary | ICD-10-CM | POA: Diagnosis not present

## 2023-05-08 DIAGNOSIS — G8929 Other chronic pain: Secondary | ICD-10-CM

## 2023-05-08 DIAGNOSIS — R42 Dizziness and giddiness: Secondary | ICD-10-CM

## 2023-05-08 DIAGNOSIS — Z8659 Personal history of other mental and behavioral disorders: Secondary | ICD-10-CM

## 2023-05-08 DIAGNOSIS — R778 Other specified abnormalities of plasma proteins: Secondary | ICD-10-CM | POA: Diagnosis not present

## 2023-05-08 DIAGNOSIS — M19042 Primary osteoarthritis, left hand: Secondary | ICD-10-CM

## 2023-05-08 DIAGNOSIS — M25511 Pain in right shoulder: Secondary | ICD-10-CM

## 2023-05-08 DIAGNOSIS — E039 Hypothyroidism, unspecified: Secondary | ICD-10-CM | POA: Diagnosis not present

## 2023-05-08 DIAGNOSIS — Z8679 Personal history of other diseases of the circulatory system: Secondary | ICD-10-CM

## 2023-05-08 NOTE — Patient Instructions (Signed)
Standing Labs We placed an order today for your standing lab work.   Please have your standing labs drawn in August and every 3 months  Please have your labs drawn 2 weeks prior to your appointment so that the provider can discuss your lab results at your appointment, if possible.  Please note that you may see your imaging and lab results in MyChart before we have reviewed them. We will contact you once all results are reviewed. Please allow our office up to 72 hours to thoroughly review all of the results before contacting the office for clarification of your results.  WALK-IN LAB HOURS  Monday through Thursday from 8:00 am -12:30 pm and 1:00 pm-5:00 pm and Friday from 8:00 am-12:00 pm.  Patients with office visits requiring labs will be seen before walk-in labs.  You may encounter longer than normal wait times. Please allow additional time. Wait times may be shorter on  Monday and Thursday afternoons.  We do not book appointments for walk-in labs. We appreciate your patience and understanding with our staff.   Labs are drawn by [facility]. Please bring your co-pay at the time of your lab draw.  You may receive a bill from [facility] for your lab work.  Please note if you are on Hydroxychloroquine and and an order has been placed for a Hydroxychloroquine level,  you will need to have it drawn 4 hours or more after your last dose.  If you wish to have your labs drawn at another location, please call the office 24 hours in advance so we can fax the orders.  The office is located at [address], [address], [place], [place] [address]   If you have any questions regarding directions or hours of operation,  please call [phone].   As a reminder, please drink plenty of water prior to coming for your lab work. Thanks!   Vaccines You are taking a medication(s) that can suppress your immune system.  The following immunizations are recommended: Flu annually Covid-19  Td/Tdap (tetanus,  diphtheria, pertussis) every 10 years Pneumonia (Prevnar 15 then Pneumovax 23 at least 1 year apart.  Alternatively, can take Prevnar 20 without needing additional dose) Shingrix: 2 doses from 4 weeks to 6 months apart  Please check with your PCP to make sure you are up to date.   If you have signs or symptoms of an infection or start antibiotics: First, call your PCP for workup of your infection. Hold your medication through the infection, until you complete your antibiotics, and until symptoms resolve if you take the following: Injectable medication (Actemra, Benlysta, Cimzia, Cosentyx, Enbrel, Humira, Kevzara, Orencia, Remicade, Simponi, Stelara, Taltz, Tremfya) Methotrexate Leflunomide (Arava) Mycophenolate (Cellcept) Xeljanz, Olumiant, or Rinvoq  

## 2023-05-09 ENCOUNTER — Telehealth: Payer: Self-pay | Admitting: Rheumatology

## 2023-05-09 LAB — CBC WITH DIFFERENTIAL/PLATELET
Absolute Monocytes: 390 cells/uL (ref 200–950)
Basophils Absolute: 71 cells/uL (ref 0–200)
Basophils Relative: 1.5 %
Eosinophils Absolute: 38 cells/uL (ref 15–500)
Eosinophils Relative: 0.8 %
HCT: 36.7 % (ref 35.0–45.0)
Hemoglobin: 12.4 g/dL (ref 11.7–15.5)
Lymphs Abs: 1123 cells/uL (ref 850–3900)
MCH: 34.6 pg — ABNORMAL HIGH (ref 27.0–33.0)
MCHC: 33.8 g/dL (ref 32.0–36.0)
MCV: 102.5 fL — ABNORMAL HIGH (ref 80.0–100.0)
MPV: 11.2 fL (ref 7.5–12.5)
Monocytes Relative: 8.3 %
Neutro Abs: 3079 cells/uL (ref 1500–7800)
Neutrophils Relative %: 65.5 %
Platelets: 206 10*3/uL (ref 140–400)
RBC: 3.58 10*6/uL — ABNORMAL LOW (ref 3.80–5.10)
RDW: 13.2 % (ref 11.0–15.0)
Total Lymphocyte: 23.9 %
WBC: 4.7 10*3/uL (ref 3.8–10.8)

## 2023-05-09 LAB — COMPLETE METABOLIC PANEL WITH GFR
AG Ratio: 1.9 (calc) (ref 1.0–2.5)
ALT: 16 U/L (ref 6–29)
AST: 21 U/L (ref 10–35)
Albumin: 4 g/dL (ref 3.6–5.1)
Alkaline phosphatase (APISO): 31 U/L — ABNORMAL LOW (ref 37–153)
BUN/Creatinine Ratio: 14 (calc) (ref 6–22)
BUN: 17 mg/dL (ref 7–25)
CO2: 31 mmol/L (ref 20–32)
Calcium: 9.4 mg/dL (ref 8.6–10.4)
Chloride: 102 mmol/L (ref 98–110)
Creat: 1.19 mg/dL — ABNORMAL HIGH (ref 0.60–1.00)
Globulin: 2.1 g/dL (calc) (ref 1.9–3.7)
Glucose, Bld: 82 mg/dL (ref 65–99)
Potassium: 3.7 mmol/L (ref 3.5–5.3)
Sodium: 142 mmol/L (ref 135–146)
Total Bilirubin: 0.5 mg/dL (ref 0.2–1.2)
Total Protein: 6.1 g/dL (ref 6.1–8.1)
eGFR: 48 mL/min/{1.73_m2} — ABNORMAL LOW (ref >=60)

## 2023-05-09 NOTE — Telephone Encounter (Signed)
Lab results put in mailbox to be mailed.

## 2023-05-09 NOTE — Progress Notes (Signed)
CMP shows elevated creatinine which is stable with GFR 48.  CBC is a stable.

## 2023-05-09 NOTE — Telephone Encounter (Signed)
Patient requested a copy of her labwork results be mailed to her home address. [address] [place], [place]  [address]

## 2023-05-15 DIAGNOSIS — R42 Dizziness and giddiness: Secondary | ICD-10-CM | POA: Diagnosis not present

## 2023-05-15 DIAGNOSIS — R2689 Other abnormalities of gait and mobility: Secondary | ICD-10-CM | POA: Diagnosis not present

## 2023-05-23 DIAGNOSIS — Z79891 Long term (current) use of opiate analgesic: Secondary | ICD-10-CM | POA: Diagnosis not present

## 2023-05-23 DIAGNOSIS — M150 Primary generalized (osteo)arthritis: Secondary | ICD-10-CM | POA: Diagnosis not present

## 2023-05-23 DIAGNOSIS — M0579 Rheumatoid arthritis with rheumatoid factor of multiple sites without organ or systems involvement: Secondary | ICD-10-CM | POA: Diagnosis not present

## 2023-05-23 DIAGNOSIS — G894 Chronic pain syndrome: Secondary | ICD-10-CM | POA: Diagnosis not present

## 2023-05-29 ENCOUNTER — Other Ambulatory Visit: Payer: Self-pay | Admitting: Rheumatology

## 2023-05-29 DIAGNOSIS — M0579 Rheumatoid arthritis with rheumatoid factor of multiple sites without organ or systems involvement: Secondary | ICD-10-CM

## 2023-06-07 ENCOUNTER — Other Ambulatory Visit: Payer: Self-pay | Admitting: Rheumatology

## 2023-06-07 DIAGNOSIS — M0579 Rheumatoid arthritis with rheumatoid factor of multiple sites without organ or systems involvement: Secondary | ICD-10-CM

## 2023-06-07 NOTE — Telephone Encounter (Signed)
Last Fill: 03/28/2023  Eye exam: 10/27/2022 WNL   Labs: 05/08/2023 CMP shows elevated creatinine which is stable with GFR 48.  CBC is a stable.   Next Visit: 10/23/2023  Last Visit: 05/08/2023  DX: Rheumatoid arthritis involving multiple sites with positive rheumatoid factor    Current Dose per office note 05/08/2023: Plaquenil 200 mg 1 tablet by mouth BID M-F   Okay to refill Plaquenil?

## 2023-06-13 ENCOUNTER — Other Ambulatory Visit: Payer: Self-pay | Admitting: *Deleted

## 2023-06-13 DIAGNOSIS — Z780 Asymptomatic menopausal state: Secondary | ICD-10-CM

## 2023-06-14 ENCOUNTER — Other Ambulatory Visit: Payer: Self-pay | Admitting: Physician Assistant

## 2023-06-14 DIAGNOSIS — I2510 Atherosclerotic heart disease of native coronary artery without angina pectoris: Secondary | ICD-10-CM | POA: Diagnosis not present

## 2023-06-14 DIAGNOSIS — M069 Rheumatoid arthritis, unspecified: Secondary | ICD-10-CM | POA: Diagnosis not present

## 2023-06-14 DIAGNOSIS — E039 Hypothyroidism, unspecified: Secondary | ICD-10-CM | POA: Diagnosis not present

## 2023-06-14 DIAGNOSIS — R159 Full incontinence of feces: Secondary | ICD-10-CM

## 2023-06-14 DIAGNOSIS — R109 Unspecified abdominal pain: Secondary | ICD-10-CM | POA: Diagnosis not present

## 2023-06-14 DIAGNOSIS — D849 Immunodeficiency, unspecified: Secondary | ICD-10-CM | POA: Diagnosis not present

## 2023-06-14 DIAGNOSIS — N1832 Chronic kidney disease, stage 3b: Secondary | ICD-10-CM | POA: Diagnosis not present

## 2023-06-14 DIAGNOSIS — I10 Essential (primary) hypertension: Secondary | ICD-10-CM | POA: Diagnosis not present

## 2023-06-14 DIAGNOSIS — R195 Other fecal abnormalities: Secondary | ICD-10-CM | POA: Diagnosis not present

## 2023-06-14 DIAGNOSIS — Z8719 Personal history of other diseases of the digestive system: Secondary | ICD-10-CM | POA: Diagnosis not present

## 2023-06-19 ENCOUNTER — Other Ambulatory Visit: Payer: Medicare Other

## 2023-06-27 ENCOUNTER — Other Ambulatory Visit: Payer: Self-pay | Admitting: Physician Assistant

## 2023-06-27 NOTE — Telephone Encounter (Signed)
Last Fill: 02/07/2023  Labs: 05/08/2023 CMP shows elevated creatinine which is stable with GFR 48.  CBC is a stable.   Next Visit: 10/23/2023  Last Visit: 05/08/2023  DX: Rheumatoid arthritis involving multiple sites with positive rheumatoid factor    Current Dose per office note 05/08/2023: Methotrexate 0.3 ml sq inj once weekly,   Okay to refill Methotrexate?

## 2023-07-05 ENCOUNTER — Other Ambulatory Visit: Payer: Self-pay | Admitting: Physician Assistant

## 2023-07-05 ENCOUNTER — Other Ambulatory Visit: Payer: Self-pay | Admitting: Rheumatology

## 2023-07-05 DIAGNOSIS — M0579 Rheumatoid arthritis with rheumatoid factor of multiple sites without organ or systems involvement: Secondary | ICD-10-CM

## 2023-07-09 DIAGNOSIS — R2689 Other abnormalities of gait and mobility: Secondary | ICD-10-CM | POA: Diagnosis not present

## 2023-07-09 DIAGNOSIS — R42 Dizziness and giddiness: Secondary | ICD-10-CM | POA: Diagnosis not present

## 2023-07-16 DIAGNOSIS — R2689 Other abnormalities of gait and mobility: Secondary | ICD-10-CM | POA: Diagnosis not present

## 2023-07-16 DIAGNOSIS — R42 Dizziness and giddiness: Secondary | ICD-10-CM | POA: Diagnosis not present

## 2023-07-18 ENCOUNTER — Other Ambulatory Visit: Payer: Self-pay | Admitting: Physician Assistant

## 2023-07-18 DIAGNOSIS — Z0000 Encounter for general adult medical examination without abnormal findings: Secondary | ICD-10-CM

## 2023-07-19 ENCOUNTER — Inpatient Hospital Stay: Admission: RE | Admit: 2023-07-19 | Payer: Medicare Other | Source: Ambulatory Visit

## 2023-07-24 DIAGNOSIS — R2689 Other abnormalities of gait and mobility: Secondary | ICD-10-CM | POA: Diagnosis not present

## 2023-07-24 DIAGNOSIS — R42 Dizziness and giddiness: Secondary | ICD-10-CM | POA: Diagnosis not present

## 2023-07-26 ENCOUNTER — Ambulatory Visit: Payer: Medicare Other

## 2023-08-08 ENCOUNTER — Inpatient Hospital Stay: Admission: RE | Admit: 2023-08-08 | Payer: Medicare Other | Source: Ambulatory Visit

## 2023-08-16 ENCOUNTER — Other Ambulatory Visit: Payer: Self-pay | Admitting: Rheumatology

## 2023-08-16 ENCOUNTER — Other Ambulatory Visit: Payer: Self-pay | Admitting: Physician Assistant

## 2023-08-16 DIAGNOSIS — M0579 Rheumatoid arthritis with rheumatoid factor of multiple sites without organ or systems involvement: Secondary | ICD-10-CM

## 2023-08-16 NOTE — Telephone Encounter (Signed)
Last Fill: 04/30/2023  Next Visit: 10/23/2023  Last Visit: 05/08/2023  Dx: Rheumatoid arthritis involving multiple sites with positive rheumatoid factor   Current Dose per office note on 05/08/2023: prednisone 5 mg alternating with 10 mg every other day   Okay to refill Prednisone?

## 2023-08-17 ENCOUNTER — Ambulatory Visit: Payer: Medicare Other

## 2023-08-24 ENCOUNTER — Other Ambulatory Visit: Payer: Medicare Other

## 2023-08-31 ENCOUNTER — Ambulatory Visit: Payer: Medicare Other

## 2023-09-01 ENCOUNTER — Other Ambulatory Visit: Payer: Self-pay | Admitting: Rheumatology

## 2023-09-01 DIAGNOSIS — M0579 Rheumatoid arthritis with rheumatoid factor of multiple sites without organ or systems involvement: Secondary | ICD-10-CM

## 2023-09-03 NOTE — Telephone Encounter (Signed)
Last Fill: 06/07/2023  Eye exam: 10/27/2022 WNL   Labs: 05/08/2023 CMP shows elevated creatinine which is stable with GFR 48.  CBC is a stable.   Next Visit: 10/23/2023  Last Visit: 05/08/2023  DX: Rheumatoid arthritis involving multiple sites with positive rheumatoid factor    Current Dose per office note 05/08/2023 : Plaquenil 200 mg 1 tablet by mouth BID M-F   Okay to refill Plaquenil?

## 2023-09-11 DIAGNOSIS — Z23 Encounter for immunization: Secondary | ICD-10-CM | POA: Diagnosis not present

## 2023-09-11 DIAGNOSIS — Z0000 Encounter for general adult medical examination without abnormal findings: Secondary | ICD-10-CM | POA: Diagnosis not present

## 2023-09-11 DIAGNOSIS — E039 Hypothyroidism, unspecified: Secondary | ICD-10-CM | POA: Diagnosis not present

## 2023-09-11 DIAGNOSIS — D692 Other nonthrombocytopenic purpura: Secondary | ICD-10-CM | POA: Diagnosis not present

## 2023-09-11 DIAGNOSIS — I252 Old myocardial infarction: Secondary | ICD-10-CM | POA: Diagnosis not present

## 2023-09-11 DIAGNOSIS — R739 Hyperglycemia, unspecified: Secondary | ICD-10-CM | POA: Diagnosis not present

## 2023-09-11 DIAGNOSIS — D849 Immunodeficiency, unspecified: Secondary | ICD-10-CM | POA: Diagnosis not present

## 2023-09-11 DIAGNOSIS — M069 Rheumatoid arthritis, unspecified: Secondary | ICD-10-CM | POA: Diagnosis not present

## 2023-09-11 DIAGNOSIS — I10 Essential (primary) hypertension: Secondary | ICD-10-CM | POA: Diagnosis not present

## 2023-09-11 DIAGNOSIS — N1832 Chronic kidney disease, stage 3b: Secondary | ICD-10-CM | POA: Diagnosis not present

## 2023-09-11 DIAGNOSIS — E7800 Pure hypercholesterolemia, unspecified: Secondary | ICD-10-CM | POA: Diagnosis not present

## 2023-09-12 ENCOUNTER — Other Ambulatory Visit: Payer: Medicare Other

## 2023-09-14 ENCOUNTER — Telehealth: Payer: Self-pay | Admitting: *Deleted

## 2023-09-14 NOTE — Telephone Encounter (Signed)
Labs received from: [place], [place]  Drawn on:06/14/2023  Reviewed by:Sherron Ales, PA-C  Labs drawn: CBC, CMP  Results: RBC 3.42   Hgb 11.9   Hct 35.7   MCV  104.3   MCH 34.9   Neutro % 78.9   Lymph % 15.0   Mono% 4.4   Glucose 163   Creatinine 1.31   GFR 42   CO2 35   Total Protein 5.8   ALP 35  Patient is on PLQ 200 mg po BID M-F. Prednisone 10 mg po daily and MTX 0.3 mL weekly.   Per Ladona Ridgel, the note says MTX 0.6/ week, Please clarify dose with patient.   Spoke with patient and she verified she is taking 0.3 mL weekly of MTX,.

## 2023-09-24 DIAGNOSIS — Z79891 Long term (current) use of opiate analgesic: Secondary | ICD-10-CM | POA: Diagnosis not present

## 2023-09-24 DIAGNOSIS — M150 Primary generalized (osteo)arthritis: Secondary | ICD-10-CM | POA: Diagnosis not present

## 2023-09-24 DIAGNOSIS — G894 Chronic pain syndrome: Secondary | ICD-10-CM | POA: Diagnosis not present

## 2023-09-24 DIAGNOSIS — M0579 Rheumatoid arthritis with rheumatoid factor of multiple sites without organ or systems involvement: Secondary | ICD-10-CM | POA: Diagnosis not present

## 2023-09-25 ENCOUNTER — Ambulatory Visit
Admission: RE | Admit: 2023-09-25 | Discharge: 2023-09-25 | Disposition: A | Discharge status: Home / Self Care | Payer: Medicare Other | Source: Ambulatory Visit | Attending: Physician Assistant | Admitting: Physician Assistant

## 2023-09-25 DIAGNOSIS — R159 Full incontinence of feces: Secondary | ICD-10-CM

## 2023-09-25 DIAGNOSIS — R109 Unspecified abdominal pain: Secondary | ICD-10-CM | POA: Diagnosis not present

## 2023-09-25 DIAGNOSIS — R140 Abdominal distension (gaseous): Secondary | ICD-10-CM | POA: Diagnosis not present

## 2023-09-25 MED ORDER — IOPAMIDOL (ISOVUE-300) INJECTION 61%
500.0000 mL | Freq: Once | INTRAVENOUS | Status: AC | PRN
  Administered 2023-09-25: 100 mL via INTRAVENOUS

## 2023-10-17 ENCOUNTER — Other Ambulatory Visit: Payer: Self-pay | Admitting: Physician Assistant

## 2023-10-23 ENCOUNTER — Ambulatory Visit: Payer: Medicare Other | Admitting: Rheumatology

## 2023-10-26 ENCOUNTER — Other Ambulatory Visit: Payer: Self-pay | Admitting: Physician Assistant

## 2023-10-26 DIAGNOSIS — M0579 Rheumatoid arthritis with rheumatoid factor of multiple sites without organ or systems involvement: Secondary | ICD-10-CM

## 2023-10-26 NOTE — Progress Notes (Unsigned)
Office Visit Note  Patient: Vanessa Collins             Date of Birth: 09-01-1946           MRN: [id number]             PCP: Clovis Riley, L.August Saucer, MD (Inactive) Referring: Clovis Riley, Elbert Ewings.August Saucer, MD Visit Date: 11/01/2023 Occupation: @GUAROCC @  Subjective:  Pain in multiple joints   History of Present Illness: Vanessa Collins is a 77 y.o. female with history of seropositive rheumatoid arthritis and osteoarthritis. Patient is prescribed Methotrexate 0.3 ml sq inj once weekly, folic acid 1 mg daily, Plaquenil 200 mg 1 tablet by mouth BID M-F, and prednisone 5 mg alternating with 10 mg every other day.  Patient has been out of her prescription for methotrexate for the past 3 weeks.  Patient states she is also been holding Plaquenil for the past 3 weeks.  Patient states that every 6 months or so she tries to reset her body and holds plaquenil for a month at a time. She is experiencing an exacerbation of symptoms.  She is currently having increased pain and swelling involving both hands and both ankle joints.  Patient states she is currently in 9 out of 10 pain.   Activities of Daily Living:  Patient reports morning stiffness for 4 hours.   Patient Reports nocturnal pain.  Difficulty dressing/grooming: Reports Difficulty climbing stairs: Denies Difficulty getting out of chair: Denies Difficulty using hands for taps, buttons, cutlery, and/or writing: Reports  Review of Systems  Constitutional:  Positive for fatigue.  HENT:  Negative for mouth sores and mouth dryness.   Eyes:  Positive for dryness. Negative for pain and visual disturbance.  Respiratory:  Negative for shortness of breath.   Cardiovascular:  Positive for palpitations. Negative for chest pain.  Gastrointestinal:  Positive for diarrhea. Negative for blood in stool and constipation.  Endocrine: Positive for increased urination.  Genitourinary:  Positive for involuntary urination.  Musculoskeletal:  Positive for joint pain, joint pain, joint  swelling, myalgias, muscle weakness, morning stiffness, muscle tenderness and myalgias. Negative for gait problem.  Skin:  Negative for color change, rash, hair loss and sensitivity to sunlight.  Allergic/Immunologic: Negative for susceptible to infections.  Neurological:  Positive for dizziness. Negative for headaches.  Hematological:  Negative for swollen glands.  Psychiatric/Behavioral:  Positive for depressed mood. Negative for sleep disturbance. The patient is nervous/anxious.     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 10/09/2018 10/09/2018   History of depression 06/19/2017   Rheumatoid arthritis involving multiple sites with positive rheumatoid factor (HCC) 01/25/2017   High risk medication use 01/25/2017   Immunosuppression (HCC) 01/25/2017   Fibromyalgia 01/25/2017   Primary osteoarthritis of both hands 01/25/2017   Spondylosis of lumbar region without myelopathy or radiculopathy 01/25/2017   DJD (degenerative joint disease), cervical 01/25/2017   History of neutropenia 01/25/2017   History of coronary artery disease 01/25/2017   Lapband APS April 2010 02/25/2013   Wears dentures     Past Medical History:  Diagnosis Date   Arthritis    rheumatoid , osteo    CAD (coronary artery disease)    PCI to LAD 2010   Diverticulitis    with perforation  and colostomy placement     Fibromyalgia    Heart disease    Heart murmur    Hyperlipidemia    Hypertension    Myocardial infarction [facility] 2008   one Stent   Shingles 03/2020   per patient    Vertigo    per patient    Vitamin D deficiency    Wears dentures    gum disease    Family History  Problem Relation Age of Onset   Macular degeneration  Mother    Cancer Father    Rheum arthritis Father    Bipolar disorder Father    Bipolar disorder Daughter    Rheum arthritis Daughter    Past Surgical History:  Procedure Laterality Date   CESAREAN SECTION     COLECTOMY WITH COLOSTOMY CREATION/HARTMANN PROCEDURE  09/2017   Perforated diverticulitis   CORONARY STENT PLACEMENT  08/2009   EYE SURGERY     LAPAROSCOPIC GASTRIC BANDING  04/05/2009   Dr Daphine Deutscher   PROCTOSCOPY N/A 10/09/2018   Procedure: RIDGID PROCTOSCOPY;  Surgeon: Karie Soda, MD;  Location: [facility];  Service: General;  Laterality: N/A;   VAGINAL HYSTERECTOMY     VENTRAL HERNIA REPAIR N/A 07/11/2019   Procedure: LAPAROSCOPIC ASSISTED VENTRAL HERNIA REPAIR;  Surgeon: Luretha Murphy, MD;  Location: [facility];  Service: General;  Laterality: N/A;   Social History   Social History Narrative   Not on file   Immunization History  Administered Date(s) Administered   PFIZER(Purple Top)SARS-COV-2 Vaccination 02/22/2020, 04/07/2020     Objective: Vital Signs: BP 103/68 (BP Location: Left Arm, Patient Position: Sitting, Cuff Size: Normal)   Pulse 62   Resp 16   Ht 5\' 3"  (1.6 m)   Wt 156 lb 6.4 oz (70.9 kg)   BMI 27.71 kg/m    Physical Exam Vitals and nursing note reviewed.  Constitutional:      Appearance: She is well-developed.  HENT:     Head: Normocephalic and atraumatic.  Eyes:     Conjunctiva/sclera: Conjunctivae normal.  Cardiovascular:     Rate and Rhythm: Normal rate and regular rhythm.     Heart sounds: Normal heart sounds.  Pulmonary:     Effort: Pulmonary effort is normal.     Breath sounds: Normal breath sounds.  Abdominal:     General: Bowel sounds are normal.     Palpations: Abdomen is soft.  Musculoskeletal:     Cervical back: Normal range of motion.  Lymphadenopathy:     Cervical: No cervical adenopathy.  Skin:    General: Skin is warm and dry.     Capillary Refill: Capillary refill takes less than 2 seconds.  Neurological:     Mental  Status: She is alert and oriented to person, place, and time.  Psychiatric:        Behavior: Behavior normal.      Musculoskeletal Exam: C-spine has limited range of motion with lateral rotation.  Thoracic kyphosis noted.  Patient remained seated during the examination today due to vertigo.  Painful range of motion of both shoulders.  Elbow joints and wrist joints have good range of motion.  Tenderness over both wrist joints.  No synovitis over MCP joints.  Synovial thickening of the right second MCP joint.  CMC, PIP, DIP thickening.  Tenderness overlying both hands.  Knee joints have good range of motion with no warmth or effusion.  Tenderness and synovitis of the left ankle joint noted today.  Tenderness and synovial thickening over the right ankle  noted.   CDAI Exam: CDAI Score: -- Patient Global: --; Provider Global: -- Swollen: --; Tender: -- Joint Exam 11/01/2023   No joint exam has been documented for this visit   There is currently no information documented on the homunculus. Go to the Rheumatology activity and complete the homunculus joint exam.  Investigation: No additional findings.  Imaging: No results found.  Recent Labs: Lab Results  Component Value Date   WBC 4.7 05/08/2023   HGB 12.4 05/08/2023   PLT 206 05/08/2023   NA 142 05/08/2023   K 3.7 05/08/2023   CL 102 05/08/2023   CO2 31 05/08/2023   GLUCOSE 82 05/08/2023   BUN 17 05/08/2023   CREATININE 1.19 (H) 05/08/2023   BILITOT 0.5 05/08/2023   ALKPHOS 121 07/29/2021   AST 21 05/08/2023   ALT 16 05/08/2023   PROT 6.1 05/08/2023   ALBUMIN 2.1 (L) 07/29/2021   CALCIUM 9.4 05/08/2023   GFRAA 72 04/26/2021    Speciality Comments: PLQ eye exam 10/27/2022 WNL Dr. Ander Purpura. Follow up in 1 year.   Prior therapy includes: Harriette Ohara (GI perforation), Humira (inadequate response), Enbrel (inadequate response), and Orencia (nausea and headaches)  Procedures:  No procedures performed Allergies: Vicodin  [hydrocodone-acetaminophen] and Aspirin     Assessment / Plan:     Visit Diagnoses: Rheumatoid arthritis involving multiple sites with positive rheumatoid factor [facility]): Patient presents today experiencing exacerbation of symptoms.  She is having severe pain involving both hands and both ankle joints.  She has tenderness over both wrists and MCP joints but no synovitis was noted.  She has tenderness and mild inflammation in the left ankle joint noted today.  Synovial thickening and tenderness of both ankles noted.  Patient is prescribed methotrexate 0.3 mL subcu injections once weekly, folic acid 1 mg daily, Plaquenil 200 mg 1 tablet by mouth twice daily Monday through Friday, and prednisone 5 mg alternating with 10 mg every other day.  She has been out of her prescription for methotrexate for the past 3 weeks and has been holding Plaquenil for about 3 weeks.  According to the patient every 6 months or so she holds Plaquenil for about 1 month due to wanting to reset her body.  Patient currently rates her pain a 9 out of 10 during the gap in therapy.  A refill of methotrexate was sent to the pharmacy today.  CBC and CMP were updated.  Patient plans on resuming her dose of Plaquenil starting today.  Patient states that the only relief she has gotten in the past was when she was taking [place].  Discussed the concern for resuming Harriette Ohara or any other Jak inhibitor given history of GI perforation while taking Harriette Ohara.  She will notify us if her symptoms persist or worsen.  She will follow-up in the office in 5 months or sooner if needed.  High risk medication use - Methotrexate 0.3 ml sq inj once weekly, folic acid 1 mg daily, Plaquenil 200 mg 1 tablet by mouth BID M-F,prednisone 5 mg alternating with 10 mg every other day. CBC and CMP updated on 05/08/23.  Orders for CBC and CMP were released today. No recent or recurrent infections.  Discussed the importance of holding methotrexate if she develops signs or  symptoms of an infection and to resume once the infection has completely cleared.  PLQ eye exam 10/27/2022 WNL Dr. Ander Purpura. Follow up in 1 year.  Due to update DEXA.  - Plan: CBC with Differential/Platelet, COMPLETE METABOLIC PANEL  WITH GFR  Current chronic use of systemic steroids: DEXA scan was normal on August 09, 2021.  Plan to get repeat DEXA scan in September 2024.  DEXA ordered   Chronic right shoulder pain: Chronic pain.  Tenderness of the right shoulder noted today.   Primary osteoarthritis of both hands: CMC, PIP, and DIP thickening noted.  Chronic pain.   DDD (degenerative disc disease), cervical: C-spine has limited ROM with lateral rotation.   Degeneration of intervertebral disc of lumbar region without discogenic back pain or lower extremity pain: Followed by Dr. Vear Clock.   Fibromyalgia: Patient has generalized hyperalgesia and positive tender points on examination today.  Other fatigue: Chronic   Other medical conditions are listed as follows:  Abnormal SPEP  Vitamin D deficiency  History of hyperlipidemia  Lapband APS April 2010  History of depression  Status post colostomy [facility])  History of coronary artery disease  Essential hypertension, benign: Blood pressure is 103/68 today in the office.  Orders: Orders Placed This Encounter  Procedures   CBC with Differential/Platelet   COMPLETE METABOLIC PANEL WITH GFR   Meds ordered this encounter  Medications   TUBERCULIN SYR 1CC/27GX1/2" (B-D TB SYRINGE 1CC/27GX1/2") 27G X 1/2" 1 ML MISC    Sig: Use to inject methotrexate once weekly.    Dispense:  12 each    Refill:  3   methotrexate 50 MG/2ML injection    Sig: Inject 0.3 mLs (7.5 mg total) into the skin once a week.    Dispense:  4 mL    Refill:  0     Follow-Up Instructions: Return in about 5 months (around 03/31/2024) for Rheumatoid arthritis, Osteoarthritis.   Gearldine Bienenstock, PA-C  Note - This record has been created using Dragon software.   Chart creation errors have been sought, but may not always  have been located. Such creation errors do not reflect on  the standard of medical care.

## 2023-11-01 ENCOUNTER — Ambulatory Visit: Payer: Medicare Other | Attending: Rheumatology | Admitting: Physician Assistant

## 2023-11-01 ENCOUNTER — Encounter: Payer: Self-pay | Admitting: Physician Assistant

## 2023-11-01 VITALS — BP 103/68 | HR 62 | Resp 16 | Ht 63.0 in | Wt 156.4 lb

## 2023-11-01 DIAGNOSIS — I10 Essential (primary) hypertension: Secondary | ICD-10-CM | POA: Diagnosis not present

## 2023-11-01 DIAGNOSIS — M5030 Other cervical disc degeneration, unspecified cervical region: Secondary | ICD-10-CM

## 2023-11-01 DIAGNOSIS — Z9884 Bariatric surgery status: Secondary | ICD-10-CM

## 2023-11-01 DIAGNOSIS — E559 Vitamin D deficiency, unspecified: Secondary | ICD-10-CM

## 2023-11-01 DIAGNOSIS — M0579 Rheumatoid arthritis with rheumatoid factor of multiple sites without organ or systems involvement: Secondary | ICD-10-CM | POA: Diagnosis not present

## 2023-11-01 DIAGNOSIS — R5383 Other fatigue: Secondary | ICD-10-CM

## 2023-11-01 DIAGNOSIS — Z7952 Long term (current) use of systemic steroids: Secondary | ICD-10-CM | POA: Diagnosis not present

## 2023-11-01 DIAGNOSIS — M25511 Pain in right shoulder: Secondary | ICD-10-CM

## 2023-11-01 DIAGNOSIS — Z79899 Other long term (current) drug therapy: Secondary | ICD-10-CM | POA: Diagnosis not present

## 2023-11-01 DIAGNOSIS — M51369 Other intervertebral disc degeneration, lumbar region without mention of lumbar back pain or lower extremity pain: Secondary | ICD-10-CM

## 2023-11-01 DIAGNOSIS — Z8639 Personal history of other endocrine, nutritional and metabolic disease: Secondary | ICD-10-CM | POA: Diagnosis not present

## 2023-11-01 DIAGNOSIS — R778 Other specified abnormalities of plasma proteins: Secondary | ICD-10-CM

## 2023-11-01 DIAGNOSIS — M19042 Primary osteoarthritis, left hand: Secondary | ICD-10-CM

## 2023-11-01 DIAGNOSIS — Z933 Colostomy status: Secondary | ICD-10-CM

## 2023-11-01 DIAGNOSIS — M19041 Primary osteoarthritis, right hand: Secondary | ICD-10-CM | POA: Diagnosis not present

## 2023-11-01 DIAGNOSIS — E039 Hypothyroidism, unspecified: Secondary | ICD-10-CM | POA: Diagnosis not present

## 2023-11-01 DIAGNOSIS — M797 Fibromyalgia: Secondary | ICD-10-CM | POA: Diagnosis not present

## 2023-11-01 DIAGNOSIS — Z8679 Personal history of other diseases of the circulatory system: Secondary | ICD-10-CM

## 2023-11-01 DIAGNOSIS — I2510 Atherosclerotic heart disease of native coronary artery without angina pectoris: Secondary | ICD-10-CM | POA: Diagnosis not present

## 2023-11-01 DIAGNOSIS — E782 Mixed hyperlipidemia: Secondary | ICD-10-CM | POA: Diagnosis not present

## 2023-11-01 DIAGNOSIS — G8929 Other chronic pain: Secondary | ICD-10-CM

## 2023-11-01 DIAGNOSIS — Z8659 Personal history of other mental and behavioral disorders: Secondary | ICD-10-CM

## 2023-11-01 MED ORDER — BD TB SYRINGE 27G X 1/2" 1 ML MISC: 3 refills | Status: DC

## 2023-11-01 MED ORDER — METHOTREXATE SODIUM CHEMO INJECTION 50 MG/2ML: 7.5000 mg | INTRAMUSCULAR | 0 refills | Status: DC

## 2023-11-01 NOTE — Patient Instructions (Signed)

## 2023-11-02 LAB — CBC WITH DIFFERENTIAL/PLATELET
Absolute Lymphocytes: 2000 {cells}/uL (ref 850–3900)
Absolute Monocytes: 410 {cells}/uL (ref 200–950)
Basophils Absolute: 80 {cells}/uL (ref 0–200)
Basophils Relative: 1.6 %
Eosinophils Absolute: 90 {cells}/uL (ref 15–500)
Eosinophils Relative: 1.8 %
HCT: 39.1 % (ref 35.0–45.0)
Hemoglobin: 13.1 g/dL (ref 11.7–15.5)
MCH: 34.2 pg — ABNORMAL HIGH (ref 27.0–33.0)
MCHC: 33.5 g/dL (ref 32.0–36.0)
MCV: 102.1 fL — ABNORMAL HIGH (ref 80.0–100.0)
MPV: 10.8 fL (ref 7.5–12.5)
Monocytes Relative: 8.2 %
Neutro Abs: 2420 {cells}/uL (ref 1500–7800)
Neutrophils Relative %: 48.4 %
Platelets: 216 10*3/uL (ref 140–400)
RBC: 3.83 10*6/uL (ref 3.80–5.10)
RDW: 12.5 % (ref 11.0–15.0)
Total Lymphocyte: 40 %
WBC: 5 10*3/uL (ref 3.8–10.8)

## 2023-11-02 LAB — COMPLETE METABOLIC PANEL WITH GFR
AG Ratio: 1.7 (calc) (ref 1.0–2.5)
ALT: 16 U/L (ref 6–29)
AST: 20 U/L (ref 10–35)
Albumin: 4 g/dL (ref 3.6–5.1)
Alkaline phosphatase (APISO): 35 U/L — ABNORMAL LOW (ref 37–153)
BUN/Creatinine Ratio: 16 (calc) (ref 6–22)
BUN: 19 mg/dL (ref 7–25)
CO2: 35 mmol/L — ABNORMAL HIGH (ref 20–32)
Calcium: 9.6 mg/dL (ref 8.6–10.4)
Chloride: 99 mmol/L (ref 98–110)
Creat: 1.2 mg/dL — ABNORMAL HIGH (ref 0.60–1.00)
Globulin: 2.3 g/dL (ref 1.9–3.7)
Glucose, Bld: 77 mg/dL (ref 65–99)
Potassium: 3.4 mmol/L — ABNORMAL LOW (ref 3.5–5.3)
Sodium: 142 mmol/L (ref 135–146)
Total Bilirubin: 0.5 mg/dL (ref 0.2–1.2)
Total Protein: 6.3 g/dL (ref 6.1–8.1)
eGFR: 47 mL/min/{1.73_m2} — ABNORMAL LOW (ref >=60)

## 2023-11-02 NOTE — Progress Notes (Signed)
CBC stable. Continue folic acid 1 mg daily.  Creatinine is elevated-1.20 and GFR is low--patient is prescribed the reduced dose of methotrexate-0.3 ml weekly but has been off of therapy for 3 weeks.  Avoid the use of NSAIDs and increase water intake.  Potassium is borderline low.   Please forward results to PCP.

## 2023-11-14 ENCOUNTER — Other Ambulatory Visit: Payer: Self-pay | Admitting: Physician Assistant

## 2023-11-14 DIAGNOSIS — M0579 Rheumatoid arthritis with rheumatoid factor of multiple sites without organ or systems involvement: Secondary | ICD-10-CM

## 2023-11-20 ENCOUNTER — Other Ambulatory Visit: Payer: Self-pay | Admitting: Physician Assistant

## 2023-11-30 ENCOUNTER — Other Ambulatory Visit: Payer: Self-pay | Admitting: Physician Assistant

## 2023-11-30 ENCOUNTER — Other Ambulatory Visit: Payer: Self-pay

## 2023-11-30 DIAGNOSIS — M0579 Rheumatoid arthritis with rheumatoid factor of multiple sites without organ or systems involvement: Secondary | ICD-10-CM

## 2023-11-30 NOTE — Telephone Encounter (Signed)
Last Fill: 09/03/2023  Eye exam: 10/27/2022   Labs: 11/01/2023 CBC stable. Continue folic acid 1 mg daily. Creatinine is elevated-1.20 and GFR is low--patient is prescribed the reduced dose of methotrexate-0.3 ml weekly but has been off of therapy for 3 weeks. Avoid the use of NSAIDs and increase water intake. Potassium is borderline low.    Next Visit: 04/16/2024  Last Visit: 11/01/2023  [id number] arthritis involving multiple sites with positive rheumatoid factor   Current Dose per office note on 11/01/2023: Plaquenil 200 mg 1 tablet by mouth BID M-F   Attempted to contact patient and left message to advise patient to have updated PLQ eye exam faxed to our office or schedule with ophthalmology if she has not updated.   Okay to refill Plaquenil?

## 2023-12-02 MED ORDER — HYDROXYCHLOROQUINE SULFATE 200 MG PO TABS: ORAL_TABLET | ORAL | 0 refills | Status: DC

## 2023-12-03 DIAGNOSIS — M0579 Rheumatoid arthritis with rheumatoid factor of multiple sites without organ or systems involvement: Secondary | ICD-10-CM | POA: Diagnosis not present

## 2023-12-03 DIAGNOSIS — Z79891 Long term (current) use of opiate analgesic: Secondary | ICD-10-CM | POA: Diagnosis not present

## 2023-12-03 DIAGNOSIS — M150 Primary generalized (osteo)arthritis: Secondary | ICD-10-CM | POA: Diagnosis not present

## 2023-12-03 DIAGNOSIS — G894 Chronic pain syndrome: Secondary | ICD-10-CM | POA: Diagnosis not present

## 2023-12-06 ENCOUNTER — Ambulatory Visit
Admission: RE | Admit: 2023-12-06 | Discharge: 2023-12-06 | Disposition: A | Discharge status: Home / Self Care | Payer: Medicare Other | Source: Ambulatory Visit | Attending: Physician Assistant | Admitting: Physician Assistant

## 2023-12-06 DIAGNOSIS — Z1231 Encounter for screening mammogram for malignant neoplasm of breast: Secondary | ICD-10-CM | POA: Diagnosis not present

## 2023-12-06 DIAGNOSIS — Z0000 Encounter for general adult medical examination without abnormal findings: Secondary | ICD-10-CM

## 2023-12-10 DIAGNOSIS — G894 Chronic pain syndrome: Secondary | ICD-10-CM | POA: Diagnosis not present

## 2023-12-10 DIAGNOSIS — Z79891 Long term (current) use of opiate analgesic: Secondary | ICD-10-CM | POA: Diagnosis not present

## 2023-12-20 ENCOUNTER — Other Ambulatory Visit: Payer: Self-pay | Admitting: Rheumatology

## 2023-12-20 DIAGNOSIS — M0579 Rheumatoid arthritis with rheumatoid factor of multiple sites without organ or systems involvement: Secondary | ICD-10-CM

## 2023-12-21 NOTE — Telephone Encounter (Signed)
Last Fill: 12/02/2023 (30 day supply)  Eye exam: 10/27/2022 WNL    Labs: 11/01/2023 CBC stable. Continue folic acid 1 mg daily. Creatinine is elevated-1.20 and GFR is low--patient is prescribed the reduced dose of methotrexate-0.3 ml weekly but has been off of therapy for 3 weeks. Avoid the use of NSAIDs and increase water intake. Potassium is borderline low.    Next Visit: 04/16/2024  Last Visit: 11/01/2023  [id number] arthritis involving multiple sites with positive rheumatoid factor   Current Dose per office note 11/01/2023:  Plaquenil 200 mg 1 tablet by mouth BID M-F   Left message to advise patient she is due to update her PLQ eye exam.   Okay to refill Plaquenil?

## 2023-12-24 DIAGNOSIS — E119 Type 2 diabetes mellitus without complications: Secondary | ICD-10-CM | POA: Diagnosis not present

## 2024-01-01 ENCOUNTER — Other Ambulatory Visit: Payer: Self-pay | Admitting: Physician Assistant

## 2024-01-01 NOTE — Telephone Encounter (Signed)
 Last Fill: 08/16/2023  Next Visit: 04/16/2024  Last Visit: 11/01/2023  Dx:  Rheumatoid arthritis involving multiple sites with positive rheumatoid factor   Current Dose per office note on 11/01/2023: prednisone  5 mg alternating with 10 mg every other day.   Okay to refill Prednisone ?

## 2024-01-08 ENCOUNTER — Other Ambulatory Visit: Payer: Self-pay | Admitting: Physician Assistant

## 2024-01-15 ENCOUNTER — Other Ambulatory Visit: Payer: Self-pay | Admitting: Rheumatology

## 2024-01-15 ENCOUNTER — Other Ambulatory Visit: Payer: Self-pay | Admitting: Physician Assistant

## 2024-01-15 DIAGNOSIS — M0579 Rheumatoid arthritis with rheumatoid factor of multiple sites without organ or systems involvement: Secondary | ICD-10-CM

## 2024-01-15 NOTE — Telephone Encounter (Signed)
Last Fill: 03/28/2023  Next Visit: 04/16/2024  Last Visit: 11/01/2023  Dx: Rheumatoid arthritis involving multiple sites with positive rheumatoid factor   Current Dose per office note on 11/01/2023: folic acid 1 mg daily   Okay to refill Folic Acid?

## 2024-01-21 ENCOUNTER — Other Ambulatory Visit: Payer: Self-pay | Admitting: Physician Assistant

## 2024-01-21 NOTE — Telephone Encounter (Signed)
Last Fill: 11/01/2023  Labs: 11/01/2023 CBC stable. Continue folic acid 1 mg daily. Creatinine is elevated-1.20 and GFR is low- Potassium is borderline low.   Next Visit: 04/16/2024  Last Visit: 11/01/2023  DX: Rheumatoid arthritis involving multiple sites with positive rheumatoid factor   Current Dose per office note 11/01/2023: Methotrexate 0.3 ml sq inj once weekly   Okay to refill Methotrexate?

## 2024-02-25 ENCOUNTER — Other Ambulatory Visit: Payer: Self-pay | Admitting: Rheumatology

## 2024-02-25 DIAGNOSIS — M0579 Rheumatoid arthritis with rheumatoid factor of multiple sites without organ or systems involvement: Secondary | ICD-10-CM

## 2024-02-28 ENCOUNTER — Telehealth: Payer: Self-pay | Admitting: *Deleted

## 2024-02-28 NOTE — Telephone Encounter (Signed)
 Patient contacted the office stating she needs a prescription refill. Patient request a return call.   Returned call to patient. Patient states she is needing a refill on her PLQ. Patient is aware she is due to update her labs. Patient advised we do not have an updated eye exam on file for her. Patient states she has updated it and will reach out to her eye doctor to have them send results. Patient advised once we receive results we can refill her medication. Patient expressed understanding.

## 2024-02-29 ENCOUNTER — Other Ambulatory Visit: Payer: Self-pay | Admitting: *Deleted

## 2024-02-29 DIAGNOSIS — M0579 Rheumatoid arthritis with rheumatoid factor of multiple sites without organ or systems involvement: Secondary | ICD-10-CM

## 2024-02-29 NOTE — Telephone Encounter (Signed)
 Last Fill: 12/21/2023 (30 day supply)  Eye exam: 12/24/2023 WNL   Labs: 11/01/2023 CBC stable. Continue folic acid 1 mg daily. Creatinine is elevated-1.20 and GFR is low. Potassium is borderline low.   Next Visit: 04/16/2024  Last Visit: 11/01/2023  DX: Rheumatoid arthritis involving multiple sites with positive rheumatoid factor   Current Dose per office note 11/01/2023: Plaquenil 200 mg 1 tablet by mouth BID M-F   Okay to refill Plaquenil?

## 2024-03-03 MED ORDER — HYDROXYCHLOROQUINE SULFATE 200 MG PO TABS: ORAL_TABLET | ORAL | 2 refills | Status: DC

## 2024-03-17 ENCOUNTER — Other Ambulatory Visit: Payer: Self-pay | Admitting: Physician Assistant

## 2024-03-20 ENCOUNTER — Other Ambulatory Visit: Payer: Self-pay | Admitting: Physician Assistant

## 2024-03-25 ENCOUNTER — Other Ambulatory Visit: Payer: Self-pay | Admitting: *Deleted

## 2024-03-25 DIAGNOSIS — Z7952 Long term (current) use of systemic steroids: Secondary | ICD-10-CM

## 2024-03-25 DIAGNOSIS — Z79899 Other long term (current) drug therapy: Secondary | ICD-10-CM

## 2024-03-25 DIAGNOSIS — M0579 Rheumatoid arthritis with rheumatoid factor of multiple sites without organ or systems involvement: Secondary | ICD-10-CM

## 2024-03-25 DIAGNOSIS — Z8639 Personal history of other endocrine, nutritional and metabolic disease: Secondary | ICD-10-CM

## 2024-03-25 DIAGNOSIS — Z131 Encounter for screening for diabetes mellitus: Secondary | ICD-10-CM

## 2024-03-26 ENCOUNTER — Other Ambulatory Visit: Payer: Self-pay | Admitting: *Deleted

## 2024-03-26 ENCOUNTER — Other Ambulatory Visit: Payer: Self-pay | Admitting: Physician Assistant

## 2024-03-26 LAB — COMPREHENSIVE METABOLIC PANEL WITH GFR
AG Ratio: 1.9 (calc) (ref 1.0–2.5)
ALT: 16 U/L (ref 6–29)
AST: 17 U/L (ref 10–35)
Albumin: 4 g/dL (ref 3.6–5.1)
Alkaline phosphatase (APISO): 34 U/L — ABNORMAL LOW (ref 37–153)
BUN/Creatinine Ratio: 23 (calc) — ABNORMAL HIGH (ref 6–22)
BUN: 28 mg/dL — ABNORMAL HIGH (ref 7–25)
CO2: 30 mmol/L (ref 20–32)
Calcium: 9.6 mg/dL (ref 8.6–10.4)
Chloride: 102 mmol/L (ref 98–110)
Creat: 1.23 mg/dL — ABNORMAL HIGH (ref 0.60–1.00)
Globulin: 2.1 g/dL (ref 1.9–3.7)
Glucose, Bld: 98 mg/dL (ref 65–99)
Potassium: 4.2 mmol/L (ref 3.5–5.3)
Sodium: 141 mmol/L (ref 135–146)
Total Bilirubin: 0.4 mg/dL (ref 0.2–1.2)
Total Protein: 6.1 g/dL (ref 6.1–8.1)
eGFR: 46 mL/min/{1.73_m2} — ABNORMAL LOW (ref >=60)

## 2024-03-26 LAB — LIPID PANEL
Cholesterol: 147 mg/dL (ref <200)
HDL: 80 mg/dL (ref >=50)
LDL Cholesterol (Calc): 47 mg/dL
Non-HDL Cholesterol (Calc): 67 mg/dL (ref <130)
Total CHOL/HDL Ratio: 1.8 (calc) (ref <5.0)
Triglycerides: 112 mg/dL (ref <150)

## 2024-03-26 LAB — CBC WITH DIFFERENTIAL/PLATELET
Absolute Lymphocytes: 922 {cells}/uL (ref 850–3900)
Absolute Monocytes: 220 {cells}/uL (ref 200–950)
Basophils Absolute: 70 {cells}/uL (ref 0–200)
Basophils Relative: 1.2 %
Eosinophils Absolute: 12 {cells}/uL — ABNORMAL LOW (ref 15–500)
Eosinophils Relative: 0.2 %
HCT: 35.6 % (ref 35.0–45.0)
Hemoglobin: 12.2 g/dL (ref 11.7–15.5)
MCH: 34.9 pg — ABNORMAL HIGH (ref 27.0–33.0)
MCHC: 34.3 g/dL (ref 32.0–36.0)
MCV: 101.7 fL — ABNORMAL HIGH (ref 80.0–100.0)
MPV: 11.4 fL (ref 7.5–12.5)
Monocytes Relative: 3.8 %
Neutro Abs: 4576 {cells}/uL (ref 1500–7800)
Neutrophils Relative %: 78.9 %
Platelets: 217 10*3/uL (ref 140–400)
RBC: 3.5 10*6/uL — ABNORMAL LOW (ref 3.80–5.10)
RDW: 12.5 % (ref 11.0–15.0)
Total Lymphocyte: 15.9 %
WBC: 5.8 10*3/uL (ref 3.8–10.8)

## 2024-03-26 LAB — HEMOGLOBIN A1C
Hgb A1c MFr Bld: 5.4 %{Hb} (ref <5.7)
Mean Plasma Glucose: 108 mg/dL
eAG (mmol/L): 6 mmol/L

## 2024-03-26 MED ORDER — METHOTREXATE SODIUM CHEMO INJECTION 50 MG/2ML: 7.5000 mg | INTRAMUSCULAR | 2 refills | Status: DC

## 2024-03-26 MED ORDER — PREDNISONE 5 MG PO TABS: ORAL_TABLET | ORAL | 0 refills | Status: DC

## 2024-03-26 NOTE — Telephone Encounter (Signed)
 Last Fill: 01/21/2024 methotrexate, 01/01/2024 prednisone  Labs: 03/25/2024 Creatinine is elevated and stable.  CBC is stable.  Hemoglobin A1c 5.4 in the desirable range.  Lipid panel is normal.  Please forward results to her PCP.   Next Visit: 04/16/2024  Last Visit: 11/01/2023  DX: Rheumatoid arthritis involving multiple sites with positive rheumatoid factor   Current Dose per office note 11/01/2023: Methotrexate 0.3 ml sq inj once weekly, prednisone 5 mg alternating with 10 mg every other day.   Okay to refill Methotrexate and prednisone?

## 2024-03-26 NOTE — Progress Notes (Signed)
 Creatinine is elevated and stable.  CBC is stable.  Hemoglobin A1c 5.4 in the desirable range.  Lipid panel is normal.  Please forward results to her PCP.

## 2024-04-04 NOTE — Progress Notes (Signed)
 Office Visit Note  Patient: Vanessa Collins             Date of Birth: April 19, 1946           MRN: [id number]             PCP: Redmon, Noelle, PA Referring: No ref. provider found Visit Date: 04/16/2024 Occupation: @GUAROCC @  Subjective:  Medication management  History of Present Illness: Vanessa Collins is a 78 y.o. female with seropositive rheumatoid arthritis, osteoarthritis, degenerative disc disease and fibromyalgia syndrome.  She returns today after her last visit on November 01, 2023.  She has been taking methotrexate  0.3 mL subcu weekly along with folic acid  and Plaquenil  200 mg p.o. twice daily Monday to Friday.  She remains on prednisone  5 mg alternating with 10 mg every other day.  She continues to have discomfort in her hands, ankles and her feet.  She states the vertigo has been interfering with her routine activities.  She had extensive workup and physical therapy without much help.    Activities of Daily Living:  Patient reports morning stiffness for 5 hours.   Patient Reports nocturnal pain.  Difficulty dressing/grooming: Reports Difficulty climbing stairs: Denies Difficulty getting out of chair: Denies Difficulty using hands for taps, buttons, cutlery, and/or writing: Reports  Review of Systems  Constitutional:  Positive for fatigue.  HENT:  Negative for mouth sores and mouth dryness.   Eyes:  Positive for dryness.  Respiratory:  Negative for shortness of breath.   Cardiovascular:  Negative for chest pain and palpitations.  Gastrointestinal:  Negative for blood in stool, constipation and diarrhea.  Endocrine: Negative for increased urination.  Genitourinary:  Positive for involuntary urination.  Musculoskeletal:  Positive for joint pain, gait problem, joint pain, joint swelling, myalgias, morning stiffness, muscle tenderness and myalgias. Negative for muscle weakness.  Skin:  Negative for color change, rash, hair loss and sensitivity to sunlight.  Allergic/Immunologic:  Negative for susceptible to infections.  Neurological:  Positive for dizziness and headaches.  Hematological:  Negative for swollen glands.  Psychiatric/Behavioral:  Positive for depressed mood. Negative for sleep disturbance. The patient is nervous/anxious.     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 10/09/2018 10/09/2018   History of depression 06/19/2017   Rheumatoid arthritis involving multiple sites with positive rheumatoid factor (HCC) 01/25/2017   High risk medication use 01/25/2017   Immunosuppression (HCC) 01/25/2017   Fibromyalgia 01/25/2017   Primary osteoarthritis of both hands 01/25/2017   Spondylosis of lumbar region without myelopathy or radiculopathy 01/25/2017   DJD (degenerative joint disease), cervical 01/25/2017   History of neutropenia 01/25/2017   History of coronary artery disease 01/25/2017   Lapband APS April 2010 02/25/2013   Wears dentures     Past Medical History:  Diagnosis Date   Arthritis    rheumatoid , osteo    CAD (coronary artery disease)    PCI to LAD 2010   Diverticulitis    with perforation and colostomy placement     Fibromyalgia    Heart disease    Heart murmur    Hyperlipidemia    Hypertension    Myocardial infarction [facility]) 2008   one Stent   Shingles 03/2020   per patient  Vertigo    per patient    Vitamin D  deficiency    Wears dentures    gum disease    Family History  Problem Relation Age of Onset   Macular degeneration Mother    Cancer Father    Rheum arthritis Father    Bipolar disorder Father    Bipolar disorder Daughter    Rheum arthritis Daughter    Past Surgical History:  Procedure Laterality Date   CESAREAN SECTION     COLECTOMY WITH  COLOSTOMY CREATION/HARTMANN PROCEDURE  09/2017   Perforated diverticulitis   CORONARY STENT PLACEMENT  08/2009   EYE SURGERY     LAPAROSCOPIC GASTRIC BANDING  04/05/2009   Dr Gaylyn Keas   PROCTOSCOPY N/A 10/09/2018   Procedure: RIDGID PROCTOSCOPY;  Surgeon: Candyce Champagne, MD;  Location: [facility];  Service: General;  Laterality: N/A;   VAGINAL HYSTERECTOMY     VENTRAL HERNIA REPAIR N/A 07/11/2019   Procedure: LAPAROSCOPIC ASSISTED VENTRAL HERNIA REPAIR;  Surgeon: Jacolyn Matar, MD;  Location: [facility];  Service: General;  Laterality: N/A;   Social History   Social History Narrative   Not on file   Immunization History  Administered Date(s) Administered   PFIZER(Purple Top)SARS-COV-2 Vaccination 02/22/2020, 04/07/2020     Objective: Vital Signs: BP 116/80 (BP Location: Left Arm, Patient Position: Sitting, Cuff Size: Normal)   Pulse 72   Resp 14   Ht 5\' 4"  (1.626 m)   Wt 156 lb (70.8 kg)   BMI 26.78 kg/m    Physical Exam Vitals and nursing note reviewed.  Constitutional:      Appearance: She is well-developed.  HENT:     Head: Normocephalic and atraumatic.  Eyes:     Conjunctiva/sclera: Conjunctivae normal.  Cardiovascular:     Rate and Rhythm: Normal rate and regular rhythm.     Heart sounds: Normal heart sounds.  Pulmonary:     Effort: Pulmonary effort is normal.     Breath sounds: Normal breath sounds.  Abdominal:     General: Bowel sounds are normal.     Palpations: Abdomen is soft.  Musculoskeletal:     Cervical back: Normal range of motion.  Lymphadenopathy:     Cervical: No cervical adenopathy.  Skin:    General: Skin is warm and dry.     Capillary Refill: Capillary refill takes less than 2 seconds.  Neurological:     Mental Status: She is alert and oriented to person, place, and time.  Psychiatric:        Behavior: Behavior normal.      Musculoskeletal Exam: She had limited lateral rotation of the cervical spine without discomfort.  Thoracic kyphosis was  noted without any discomfort.  She had no tenderness over the lumbar region.  Shoulders, elbows, wrist, MCPs PIPs and DIPs with good range of motion with no synovitis.  Synovial thickening was noted over MCP joints.  CMC, PIP and DIP prominence was noted.  Hips and knee joints with good range of motion without any warmth swelling or effusion.  She had thickening of her ankle joints without any synovitis.  No tenderness over MTPs was noted.  CDAI Exam: CDAI Score: -- Patient Global: --; Provider Global: -- Swollen: --; Tender: -- Joint Exam 04/16/2024   No joint exam has been documented for this visit   There is currently no information documented on the homunculus. Go to the Rheumatology activity and complete the homunculus joint exam.  Investigation: No additional findings.  Imaging: No results found.  Recent Labs: Lab Results  Component Value Date   WBC 5.8 03/25/2024   HGB 12.2 03/25/2024   PLT 217 03/25/2024   NA 141 03/25/2024   K 4.2 03/25/2024   CL 102 03/25/2024   CO2 30 03/25/2024   GLUCOSE 98 03/25/2024   BUN 28 (H) 03/25/2024   CREATININE 1.23 (H) 03/25/2024   BILITOT 0.4 03/25/2024   ALKPHOS 121 07/29/2021   AST 17 03/25/2024   ALT 16 03/25/2024   PROT 6.1 03/25/2024   ALBUMIN 2.1 (L) 07/29/2021   CALCIUM 9.6 03/25/2024   GFRAA 72 04/26/2021    Speciality Comments: PLQ eye exam 12/24/2023 [facility] Follow up in 1 year.   Prior therapy includes: Xeljanz  (GI perforation), Humira (inadequate response), Enbrel (inadequate response), and Orencia (nausea and headaches)  Procedures:  No procedures performed Allergies: Vicodin [hydrocodone -acetaminophen ] and Aspirin    Assessment / Plan:     Visit Diagnoses: Rheumatoid arthritis involving multiple sites with positive rheumatoid factor (HCC)-patient had no synovitis on the examination today.  She states she continues to have some discomfort in her hands, feet and her ankles.  She continues to be on  the combination of methotrexate , Plaquenil  and prednisone .  She has been tolerating medications without any side effects.  High risk medication use - Methotrexate  0.3 ml sq inj once weekly, folic acid  1 mg daily, Plaquenil  200 mg 1 tablet by mouth BID M-F,prednisone  5 mg alternating with 10 mg every other day.  Labs from March 25, 2024 were reviewed.  CBC was normal.  CMP was normal except creatinine being elevated at 1.23 which has been stable.  Information for immunization was placed in the AVS.  She was advised to hold methotrexate  if she develops an infection resume if the infection resolves.  Current chronic use of systemic steroids -she is unable to taper prednisone  she has been on prednisone  5 mg alternating with 10 mg every other day.  Side effects of long-term prednisone  use including the increased risk of GI bleed, diabetes, hypertension, obesity, cataracts, osteoporosis were again reviewed.  DEXA scan was normal on August 09, 2021.  She was advised to schedule repeat DEXA scan.  Chronic right shoulder pain-she had good range of motion without discomfort today.  Primary osteoarthritis of both hands-she continues to have pain and discomfort in the bilateral hands.  She has rheumatoid arthritis and osteoarthritis overlap with synovial thickening and PIP and DIP thickening.  No synovitis was noted.  DDD (degenerative disc disease), cervical-she had discomfort range of motion of the cervical spine and limited lateral rotation.  She denies any radiculopathy.  Degeneration of intervertebral disc of lumbar region without discogenic back pain or lower extremity pain - Followed by Dr. Valda Garnet.  Fibromyalgia-she continues to have generalized pain and discomfort from fibromyalgia.  She had positive tender points.  She has been doing stretching exercises.  Regular exercise was emphasized.  Other fatigue-related to fibromyalgia.  Further medical problems are listed as follows:  Abnormal  SPEP  Vitamin D  deficiency  Lapband APS April 2010  Status post colostomy [facility])  History of coronary artery disease  History of hyperlipidemia  Essential hypertension, benign  Vertigo-according the patient vertigo has been interfering with her routine activities.  History of depression  Orders: No orders of the defined types were placed in this encounter.  No orders of the defined types were placed in this encounter.   Follow-Up Instructions: Return in about 5 months (around 09/16/2024) for Rheumatoid arthritis, Osteoarthritis.  Nicholas Bari, MD  Note - This record has been created using Animal nutritionist.  Chart creation errors have been sought, but may not always  have been located. Such creation errors do not reflect on  the standard of medical care.

## 2024-04-16 ENCOUNTER — Other Ambulatory Visit: Payer: Self-pay | Admitting: *Deleted

## 2024-04-16 ENCOUNTER — Ambulatory Visit: Payer: Medicare Other | Attending: Rheumatology | Admitting: Rheumatology

## 2024-04-16 ENCOUNTER — Encounter: Payer: Self-pay | Admitting: Rheumatology

## 2024-04-16 VITALS — BP 116/80 | HR 72 | Resp 14 | Ht 64.0 in | Wt 156.0 lb

## 2024-04-16 DIAGNOSIS — Z780 Asymptomatic menopausal state: Secondary | ICD-10-CM

## 2024-04-16 DIAGNOSIS — E559 Vitamin D deficiency, unspecified: Secondary | ICD-10-CM

## 2024-04-16 DIAGNOSIS — Z8659 Personal history of other mental and behavioral disorders: Secondary | ICD-10-CM

## 2024-04-16 DIAGNOSIS — M5030 Other cervical disc degeneration, unspecified cervical region: Secondary | ICD-10-CM

## 2024-04-16 DIAGNOSIS — M25511 Pain in right shoulder: Secondary | ICD-10-CM | POA: Diagnosis not present

## 2024-04-16 DIAGNOSIS — R42 Dizziness and giddiness: Secondary | ICD-10-CM

## 2024-04-16 DIAGNOSIS — Z8639 Personal history of other endocrine, nutritional and metabolic disease: Secondary | ICD-10-CM

## 2024-04-16 DIAGNOSIS — Z7952 Long term (current) use of systemic steroids: Secondary | ICD-10-CM

## 2024-04-16 DIAGNOSIS — Z79899 Other long term (current) drug therapy: Secondary | ICD-10-CM

## 2024-04-16 DIAGNOSIS — Z8679 Personal history of other diseases of the circulatory system: Secondary | ICD-10-CM

## 2024-04-16 DIAGNOSIS — M797 Fibromyalgia: Secondary | ICD-10-CM

## 2024-04-16 DIAGNOSIS — M0579 Rheumatoid arthritis with rheumatoid factor of multiple sites without organ or systems involvement: Secondary | ICD-10-CM | POA: Diagnosis not present

## 2024-04-16 DIAGNOSIS — Z933 Colostomy status: Secondary | ICD-10-CM

## 2024-04-16 DIAGNOSIS — R778 Other specified abnormalities of plasma proteins: Secondary | ICD-10-CM

## 2024-04-16 DIAGNOSIS — M19042 Primary osteoarthritis, left hand: Secondary | ICD-10-CM

## 2024-04-16 DIAGNOSIS — R5383 Other fatigue: Secondary | ICD-10-CM

## 2024-04-16 DIAGNOSIS — Z9884 Bariatric surgery status: Secondary | ICD-10-CM

## 2024-04-16 DIAGNOSIS — M51369 Other intervertebral disc degeneration, lumbar region without mention of lumbar back pain or lower extremity pain: Secondary | ICD-10-CM

## 2024-04-16 DIAGNOSIS — M19041 Primary osteoarthritis, right hand: Secondary | ICD-10-CM

## 2024-04-16 DIAGNOSIS — G8929 Other chronic pain: Secondary | ICD-10-CM

## 2024-04-16 DIAGNOSIS — I10 Essential (primary) hypertension: Secondary | ICD-10-CM

## 2024-04-16 NOTE — Patient Instructions (Signed)
Standing Labs We placed an order today for your standing lab work.   Please have your standing labs drawn in July and every 3 months  Please have your labs drawn 2 weeks prior to your appointment so that the provider can discuss your lab results at your appointment, if possible.  Please note that you may see your imaging and lab results in MyChart before we have reviewed them. We will contact you once all results are reviewed. Please allow our office up to 72 hours to thoroughly review all of the results before contacting the office for clarification of your results.  WALK-IN LAB HOURS  Monday through Thursday from 8:00 am -12:30 pm and 1:00 pm-5:00 pm and Friday from 8:00 am-12:00 pm.  Patients with office visits requiring labs will be seen before walk-in labs.  You may encounter longer than normal wait times. Please allow additional time. Wait times may be shorter on  Monday and Thursday afternoons.  We do not book appointments for walk-in labs. We appreciate your patience and understanding with our staff.   Labs are drawn by [facility]. Please bring your co-pay at the time of your lab draw.  You may receive a bill from [facility] for your lab work.  Please note if you are on Hydroxychloroquine and and an order has been placed for a Hydroxychloroquine level,  you will need to have it drawn 4 hours or more after your last dose.  If you wish to have your labs drawn at another location, please call the office 24 hours in advance so we can fax the orders.  The office is located at [address], [address], [place], [place] [address]   If you have any questions regarding directions or hours of operation,  please call [phone].   As a reminder, please drink plenty of water prior to coming for your lab work. Thanks!   Vaccines You are taking a medication(s) that can suppress your immune system.  The following immunizations are recommended: Flu annually Covid-19  Td/Tdap (tetanus, diphtheria,  pertussis) every 10 years Pneumonia (Prevnar 15 then Pneumovax 23 at least 1 year apart.  Alternatively, can take Prevnar 20 without needing additional dose) Shingrix: 2 doses from 4 weeks to 6 months apart  Please check with your PCP to make sure you are up to date.   If you have signs or symptoms of an infection or start antibiotics: First, call your PCP for workup of your infection. Hold your medication through the infection, until you complete your antibiotics, and until symptoms resolve if you take the following: Injectable medication (Actemra, Benlysta, Cimzia, Cosentyx, Enbrel, Humira, Kevzara, Orencia, Remicade, Simponi, Stelara, Taltz, Tremfya) Methotrexate Leflunomide (Arava) Mycophenolate (Cellcept) Xeljanz, Olumiant, or Rinvoq  

## 2024-04-17 ENCOUNTER — Other Ambulatory Visit: Payer: Self-pay | Admitting: Physician Assistant

## 2024-04-17 DIAGNOSIS — M0579 Rheumatoid arthritis with rheumatoid factor of multiple sites without organ or systems involvement: Secondary | ICD-10-CM

## 2024-04-21 ENCOUNTER — Emergency Department (HOSPITAL_COMMUNITY)
Admission: EM | Admit: 2024-04-21 | Discharge: 2024-04-21 | Disposition: A | Discharge status: Home / Self Care | Attending: Emergency Medicine | Admitting: Emergency Medicine

## 2024-04-21 ENCOUNTER — Encounter (HOSPITAL_COMMUNITY): Payer: Self-pay | Admitting: Emergency Medicine

## 2024-04-21 ENCOUNTER — Emergency Department (HOSPITAL_COMMUNITY)

## 2024-04-21 ENCOUNTER — Other Ambulatory Visit: Payer: Self-pay

## 2024-04-21 DIAGNOSIS — R109 Unspecified abdominal pain: Secondary | ICD-10-CM | POA: Diagnosis present

## 2024-04-21 DIAGNOSIS — K5732 Diverticulitis of large intestine without perforation or abscess without bleeding: Secondary | ICD-10-CM | POA: Diagnosis not present

## 2024-04-21 DIAGNOSIS — R509 Fever, unspecified: Secondary | ICD-10-CM | POA: Diagnosis not present

## 2024-04-21 DIAGNOSIS — I517 Cardiomegaly: Secondary | ICD-10-CM | POA: Insufficient documentation

## 2024-04-21 DIAGNOSIS — E876 Hypokalemia: Secondary | ICD-10-CM | POA: Diagnosis not present

## 2024-04-21 DIAGNOSIS — I6782 Cerebral ischemia: Secondary | ICD-10-CM | POA: Insufficient documentation

## 2024-04-21 DIAGNOSIS — R4182 Altered mental status, unspecified: Secondary | ICD-10-CM | POA: Diagnosis not present

## 2024-04-21 DIAGNOSIS — I700 Atherosclerosis of aorta: Secondary | ICD-10-CM | POA: Insufficient documentation

## 2024-04-21 DIAGNOSIS — Z7982 Long term (current) use of aspirin: Secondary | ICD-10-CM | POA: Insufficient documentation

## 2024-04-21 DIAGNOSIS — K5792 Diverticulitis of intestine, part unspecified, without perforation or abscess without bleeding: Secondary | ICD-10-CM | POA: Insufficient documentation

## 2024-04-21 LAB — CBC
HCT: 41 % (ref 36.0–46.0)
Hemoglobin: 13.3 g/dL (ref 12.0–15.0)
MCH: 34.4 pg — ABNORMAL HIGH (ref 26.0–34.0)
MCHC: 32.4 g/dL (ref 30.0–36.0)
MCV: 105.9 fL — ABNORMAL HIGH (ref 80.0–100.0)
Platelets: 204 10*3/uL (ref 150–400)
RBC: 3.87 MIL/uL (ref 3.87–5.11)
RDW: 13.4 % (ref 11.5–15.5)
WBC: 8.1 10*3/uL (ref 4.0–10.5)
nRBC: 0 % (ref 0.0–0.2)

## 2024-04-21 LAB — RESP PANEL BY RT-PCR (RSV, FLU A&B, COVID)  RVPGX2
Influenza A by PCR: NEGATIVE
Influenza B by PCR: NEGATIVE
Resp Syncytial Virus by PCR: NEGATIVE
SARS Coronavirus 2 by RT PCR: NEGATIVE

## 2024-04-21 LAB — URINALYSIS, W/ REFLEX TO CULTURE (INFECTION SUSPECTED)
Bacteria, UA: NONE SEEN
Bilirubin Urine: NEGATIVE
Glucose, UA: NEGATIVE mg/dL
Ketones, ur: NEGATIVE mg/dL
Leukocytes,Ua: NEGATIVE
Nitrite: NEGATIVE
Protein, ur: NEGATIVE mg/dL
Specific Gravity, Urine: 1.032 — ABNORMAL HIGH (ref 1.005–1.030)
pH: 5 (ref 5.0–8.0)

## 2024-04-21 LAB — COMPREHENSIVE METABOLIC PANEL WITH GFR
ALT: 19 U/L (ref 0–44)
AST: 23 U/L (ref 15–41)
Albumin: 3.7 g/dL (ref 3.5–5.0)
Alkaline Phosphatase: 34 U/L — ABNORMAL LOW (ref 38–126)
Anion gap: 10 (ref 5–15)
BUN: 23 mg/dL (ref 8–23)
CO2: 26 mmol/L (ref 22–32)
Calcium: 8.8 mg/dL — ABNORMAL LOW (ref 8.9–10.3)
Chloride: 102 mmol/L (ref 98–111)
Creatinine, Ser: 1.14 mg/dL — ABNORMAL HIGH (ref 0.44–1.00)
GFR, Estimated: 50 mL/min — ABNORMAL LOW (ref >60)
Glucose, Bld: 127 mg/dL — ABNORMAL HIGH (ref 70–99)
Potassium: 3.2 mmol/L — ABNORMAL LOW (ref 3.5–5.1)
Sodium: 138 mmol/L (ref 135–145)
Total Bilirubin: 0.6 mg/dL (ref 0.0–1.2)
Total Protein: 6.6 g/dL (ref 6.5–8.1)

## 2024-04-21 LAB — RAPID URINE DRUG SCREEN, HOSP PERFORMED
Amphetamines: NOT DETECTED
Barbiturates: NOT DETECTED
Benzodiazepines: POSITIVE — AB
Cocaine: NOT DETECTED
Opiates: NOT DETECTED
Tetrahydrocannabinol: NOT DETECTED

## 2024-04-21 MED ORDER — LACTATED RINGERS IV BOLUS
1000.0000 mL | Freq: Once | INTRAVENOUS | Status: AC
  Administered 2024-04-21: 1000 mL via INTRAVENOUS

## 2024-04-21 MED ORDER — PIPERACILLIN-TAZOBACTAM 3.375 G IVPB: 3.3750 g | Freq: Three times a day (TID) | INTRAVENOUS | Status: DC

## 2024-04-21 MED ORDER — AMOXICILLIN-POT CLAVULANATE 875-125 MG PO TABS: 1.0000 | ORAL_TABLET | Freq: Two times a day (BID) | ORAL | 0 refills | Status: AC

## 2024-04-21 MED ORDER — PIPERACILLIN-TAZOBACTAM 3.375 G IVPB 30 MIN
3.3750 g | Freq: Once | INTRAVENOUS | Status: AC
  Administered 2024-04-21: 3.375 g via INTRAVENOUS
  Filled 2024-04-21: qty 50

## 2024-04-21 MED ORDER — IOHEXOL 300 MG/ML  SOLN
100.0000 mL | Freq: Once | INTRAMUSCULAR | Status: AC | PRN
  Administered 2024-04-21: 100 mL via INTRAVENOUS

## 2024-04-21 MED ORDER — ONDANSETRON 4 MG PO TBDP: 4.0000 mg | ORAL_TABLET | Freq: Three times a day (TID) | ORAL | 0 refills | Status: AC | PRN

## 2024-04-21 MED ORDER — ONDANSETRON HCL 4 MG/2ML IJ SOLN
4.0000 mg | Freq: Once | INTRAMUSCULAR | Status: AC
Start: 2024-04-21 — End: 2024-04-21
  Administered 2024-04-21: 4 mg via INTRAVENOUS
  Filled 2024-04-21: qty 2

## 2024-04-21 MED ORDER — POTASSIUM CHLORIDE CRYS ER 20 MEQ PO TBCR: 40.0000 meq | EXTENDED_RELEASE_TABLET | Freq: Once | ORAL | Status: DC

## 2024-04-21 MED ORDER — PIPERACILLIN-TAZOBACTAM 3.375 G IVPB 30 MIN: 3.3750 g | Freq: Once | INTRAVENOUS | Status: DC

## 2024-04-21 NOTE — ED Provider Notes (Signed)
 [facility]ERGENC[facility] DEPARTMENT AT [facility] Provider Note   CSN: [id number] Arrival date & time: 04/21/24  1156     History  Chief Complaint  Patient presents with   Abdominal Pain   Emesis   Diarrhea    Vanessa Collins is a 78 y.o. female.  78 year old female brought in by EMS complaining of nausea/vomiting/diarrhea last night.  Says she "could not keep anything down", cannot recall if she vomited today.  She says "I am just not myself".  Per EMS patient had temperature 100.2, she also mentioned "my blood pressure dropped" last night.  She has a notable rash to her left upper chest with extension onto her face, she believes this is from poison ivy/oak exposure several days ago.  At this time she denies abdominal pain, dysuria, chest pain, shortness of breath, falls.  History of abdominal surgeries include C-section, laparoscopic gastric banding, colectomy following perforated diverticulitis, ventral hernia repair; when asked about these surgeries she only remembers her past C-section.   Spoke with patient's husband at the bedside, he mentions having a bout with "food poisoning" on Friday, however he does not believe that patient was exposed to him while he was ill.  He describes yesterday patient having multiple episodes of nausea/vomiting, when she woke up this morning she was very weak and not acting like herself prompted them to come to the hospital.  Her husband states that she was working in the yard on Saturday, she began to develop a rash on her left chest yesterday and he does believe it looks worse today with extension onto her face.   Abdominal Pain Associated symptoms: diarrhea and vomiting   Emesis Associated symptoms: abdominal pain and diarrhea   Diarrhea Associated symptoms: abdominal pain and vomiting        Home Medications Prior to Admission medications   Medication Sig Start Date End Date Taking? Authorizing Provider  ALPRAZolam  (XANAX ) 0.5 MG  tablet Take 0.5 mg by mouth 3 (three) times daily.    [provider]  aspirin  81 MG tablet Take 81 mg by mouth daily.    [provider]  cetirizine (ZYRTEC) 10 MG tablet Take 10 mg by mouth at bedtime.  Patient not taking: Reported on 04/16/2024    [provider]  Cholecalciferol  (VITAMIN D3) 5000 units CAPS Take 5,000 Units by mouth daily.     [provider]  estropipate  (OGEN ) 0.75 MG tablet Take 0.75 mg by mouth daily. Patient not taking: Reported on 04/16/2024 12/23/16   [provider]  famotidine  (PEPCID ) 20 MG tablet Take 20 mg by mouth 2 (two) times daily.    [provider]  fluticasone (FLONASE) 50 MCG/ACT nasal spray 2 sprays Nasally once a day as needed for 30 days 03/28/12   [provider]  folic acid  (FOLVITE ) 1 MG tablet TAKE 1 TABLET BY MOUTH EVERY DAY 01/15/24   Romayne Clubs, PA-C  hydroxychloroquine  (PLAQUENIL ) 200 MG tablet TAKE 1 TABLET BY MOUTH 2 TIMES DAILY ON MONDAY THROUGH FRIDAY OF EACH WEEK 03/03/24   Romayne Clubs, PA-C  levothyroxine  (SYNTHROID ) 25 MCG tablet Take 25 mcg by mouth daily before breakfast. Patient not taking: Reported on 04/16/2024 05/27/19   [provider]  methotrexate  50 MG/2ML injection Inject 0.3 mLs (7.5 mg total) into the skin once a week. 03/26/24   Romayne Clubs, PA-C  metoprolol  succinate (TOPROL -XL) 25 MG 24 hr tablet Take 25 mg by mouth every evening. Take with or  immediately following a meal.    [provider]  Multiple Vitamin (MULTIVITAMIN WITH MINERALS) TABS tablet Take 1 tablet by mouth daily.    [provider]  Multiple Vitamins-Minerals (MULTIVITAMIN WITH MINERALS) tablet Take 1 tablet by mouth daily. Patient not taking: Reported on 04/16/2024    [provider]  nitroGLYCERIN  (NITROSTAT ) 0.4 MG SL tablet Place 0.4 mg under the tongue every 5 (five) minutes as needed for chest pain.     [provider]   olmesartan -hydrochlorothiazide  (BENICAR  HCT) 40-25 MG tablet Take 1 tablet by mouth daily. Patient not taking: Reported on 04/16/2024 05/02/22   [provider]  PERCOCET 5-325 MG tablet Take 0.5-1 tablets by mouth daily as needed for severe pain. 07/26/20   [provider]  predniSONE  (DELTASONE ) 5 MG tablet Take 1 tablet by mouth (5 mg ) alternating with 2 tablets by mouth (10 mg) every other day every morning with breakfast. 03/26/24   Romayne Clubs, PA-C  PREMARIN  0.3 MG tablet Take 0.3 mg by mouth daily. Patient not taking: Reported on 04/16/2024 09/10/18   [provider]  sertraline  (ZOLOFT ) 50 MG tablet Take 50 mg by mouth daily.  Patient not taking: Reported on 11/01/2023    [provider]  simvastatin (ZOCOR) 80 MG tablet Take 80 mg by mouth every evening.     [provider]  telmisartan-hydrochlorothiazide  (MICARDIS HCT) 80-25 MG tablet 1/2 tablet Orally Once a day    [provider]  traMADol  (ULTRAM ) 50 MG tablet Take 2 tablets (100 mg total) by mouth 4 (four) times daily. Patient taking differently: Take 100 mg by mouth every 6 (six) hours as needed for moderate pain (pain score 4-6). 07/12/19   Joyce Nixon, MD  TUBERCULIN SYR 1CC/27GX1/2" (B-D TB SYRINGE 1CC/27GX1/2") 27G X 1/2" 1 ML MISC Use to inject methotrexate  once weekly. 11/01/23   Romayne Clubs, PA-C      Allergies    Vicodin [hydrocodone -acetaminophen ] and Aspirin     Review of Systems   Review of Systems  Gastrointestinal:  Positive for abdominal pain, diarrhea and vomiting.    Physical Exam Updated Vital Signs BP 113/67 (BP Location: Right Arm)   Pulse 94   Temp 97.8 F (36.6 C) (Oral)   Resp 18   SpO2 100%    Physical Exam Vitals and nursing note reviewed.  HENT:     Head: Normocephalic.  Eyes:     Extraocular Movements: Extraocular movements intact.     Pupils: Pupils are equal, round, and reactive to light.  Cardiovascular:     Rate and Rhythm:  Normal rate and regular rhythm.     Heart sounds: Normal heart sounds.  Pulmonary:     Effort: Pulmonary effort is normal. No respiratory distress.     Breath sounds: Normal breath sounds.  Abdominal:     General: Bowel sounds are normal.     Palpations: Abdomen is soft.     Tenderness: There is no abdominal tenderness. There is no guarding or rebound.  Skin:    General: Skin is warm and dry.     Findings: Rash (erythematous coalescing rash of left chest with extension up the neck and scattered erythema on her left face/bridge of nose/forehead) present.  Neurological:     Mental Status: She is alert and oriented to person, place, and time.     ED Results / Procedures / Treatments   Labs (all labs ordered are listed, but only abnormal results are displayed) Labs Reviewed  CBC - Abnormal; Notable for the following components:      Result Value   MCV 105.9 (*)    MCH 34.4 (*)    All other components within normal limits  COMPREHENSIVE METABOLIC PANEL WITH GFR - Abnormal; Notable for the following components:   Potassium 3.2 (*)    Glucose, Bld 127 (*)    Creatinine, Ser 1.14 (*)    Calcium 8.8 (*)    Alkaline Phosphatase 34 (*)    GFR, Estimated 50 (*)    All other components within normal limits  RESP PANEL BY RT-PCR (RSV, FLU A&B, COVID)  RVPGX2  URINALYSIS, W/ REFLEX TO CULTURE (INFECTION SUSPECTED)  RAPID URINE DRUG SCREEN, HOSP PERFORMED    EKG None  Radiology CT Head Wo Contrast Result Date: 04/21/2024 CLINICAL DATA:  Altered mental status, generalized weakness, chills. EXAM: CT HEAD WITHOUT CONTRAST TECHNIQUE: Contiguous axial images were obtained from the base of the skull through the vertex without intravenous contrast. RADIATION DOSE REDUCTION: This exam was performed according to the departmental dose-optimization program which includes automated exposure control, adjustment of the mA and/or kV according to patient size and/or use of iterative reconstruction  technique. COMPARISON:  07/29/2021 FINDINGS: Brain: No acute intracranial hemorrhage. No CT evidence of acute infarct. Nonspecific hypoattenuation in the periventricular and subcortical white matter favored to reflect chronic microvascular ischemic changes. Prominence of extra-axial spaces over the parasagittal posterior left frontal lobe with mild mass effect which may reflect an arachnoid cyst. No edema. No midline shift. The basilar cisterns are patent. Ventricles: The ventricles are normal. Vascular: No hyperdense vessel or unexpected calcification. Skull: No acute or aggressive finding. Orbits: Orbits are symmetric. Sinuses: Mild mucosal thickening in the ethmoid sinuses. Other: Mastoid air cells are clear. IMPRESSION: No CT evidence of acute intracranial abnormality. Mild chronic microvascular ischemic changes. Electronically Signed   By: Denny Flack M.D.   On: 04/21/2024 15:10   CT ABDOMEN PELVIS W CONTRAST Result Date: 04/21/2024 CLINICAL DATA:  Vomiting. EXAM: CT ABDOMEN AND PELVIS WITH CONTRAST TECHNIQUE: Multidetector CT imaging of the abdomen and pelvis was performed using the standard protocol following bolus administration of intravenous contrast. RADIATION DOSE REDUCTION: This exam was performed according to the departmental dose-optimization program which includes automated exposure control, adjustment of the mA and/or kV according to patient size and/or use of iterative reconstruction technique. CONTRAST:  100mL OMNIPAQUE IOHEXOL 300 MG/ML  SOLN COMPARISON:  CT abdomen pelvis dated 09/25/2023. FINDINGS: Lower chest: The visualized lung bases are clear. There is coronary vascular calcification. No intra-abdominal free air or free fluid. Hepatobiliary: The liver is unremarkable. No biliary dilatation. The gallbladder is unremarkable. Pancreas: Unremarkable. No pancreatic ductal dilatation or surrounding inflammatory changes. Spleen: Normal in size without focal abnormality. Adrenals/Urinary  Tract: The adrenal glands unremarkable. There is no hydronephrosis on either side. There is symmetric enhancement and excretion of contrast by both kidneys. The visualized ureters and urinary bladder appear unremarkable. Stomach/Bowel: Postsurgical changes of the bowel with anastomotic staple line in the rectosigmoid. A gastric lap band is noted. There is mild sigmoid diverticulosis. There is mild inflammatory changes centered at a distal descending/sigmoid junction diverticula (coronal 39/8) consistent with mild acute diverticulitis. No diverticular abscess or perforation. There is no bowel obstruction. The appendix is normal. Vascular/Lymphatic: Moderate aortoiliac atherosclerotic disease. The IVC is unremarkable. No portal venous gas. There is no adenopathy. Reproductive: Hysterectomy.  No suspicious adnexal masses Other: Small fat containing left ventral as well as small fat containing umbilical hernia. No bowel  herniation or fluid collection. Musculoskeletal: Osteopenia with degenerative changes of the spine and hips. No acute osseous pathology. IMPRESSION: 1. Mild acute diverticulitis of the distal descending/sigmoid junction. No diverticular abscess or perforation. 2. No bowel obstruction. Normal appendix. 3.  Aortic Atherosclerosis (ICD10-I70.0). Electronically Signed   By: Angus Bark M.D.   On: 04/21/2024 14:52   DG Chest Portable 1 View Result Date: 04/21/2024 CLINICAL DATA:  AMS abdominal pain EXAM: PORTABLE CHEST - 1 VIEW COMPARISON:  July 25, 2021 FINDINGS: No focal airspace consolidation, pleural effusion, or pneumothorax. Mild cardiomegaly. Tortuous aorta with aortic atherosclerosis. No acute fracture or destructive lesion. Multilevel thoracic osteophytosis. Similarly positioned, partially visualized gastric lap band. IMPRESSION: No acute cardiopulmonary abnormality. Electronically Signed   By: Rance Burrows M.D.   On: 04/21/2024 14:13    Procedures Procedures    Medications Ordered  in ED Medications  lactated ringers  bolus 1,000 mL (has no administration in time range)  ondansetron  (ZOFRAN ) injection 4 mg (4 mg Intravenous Given 04/21/24 1329)  iohexol (OMNIPAQUE) 300 MG/ML solution 100 mL (100 mLs Intravenous Contrast Given 04/21/24 1420)    ED Course/ Medical Decision Making/ A&P                                 Medical Decision Making This patient presents to the ED for concern of nausea/vomiting/diarrhea/confusion, this involves an extensive number of treatment options, and is a complaint that carries with it a high risk of complications and morbidity.  The differential diagnosis includes acute viral gastroenteritis, COVID/Flu/RSV, urinary tract infection, dehydration, electrolyte derangement.    Co morbidities that complicate the patient evaluation  History of multiple prior abdominal surgeries, rheumatoid arthritis with immunosuppression    Additional history obtained:  Additional history obtained from husband External records from outside source obtained and reviewed including most recent rheumatology visit note from last week.   Lab Tests:  I Ordered, and personally interpreted labs.  The pertinent results include: Potassium 3.2, creatinine of 1.14 is stable compared to patient's previous.  CBC unremarkable. Additional labs pending.    Imaging Studies ordered:  I ordered imaging studies including chest x-ray, CT abdomen/pelvis with contrast, CT head without contrast I independently visualized and interpreted imaging which showed  Chest x-ray: without evidence of acute cardiopulmonary disease.  Head CT without contrast: no acute intracranial abnormalities, mild chronic microvascular ischemic changes. CT abdomen pelvis with contrast:  1. Mild acute diverticulitis of the distal descending/sigmoid junction. No diverticular abscess or perforation. 2. No bowel obstruction. Normal appendix. 3.  Aortic Atherosclerosis (ICD10-I70.0).  I agree with the  radiologist interpretation   Cardiac Monitoring: / EKG:  The patient was maintained on a cardiac monitor.  I personally viewed and interpreted the cardiac monitored which showed an underlying rhythm of: sinus rhythm   Problem List / ED Course / Critical interventions / Medication management  I ordered medication including Zofran  for nausea, additional LR fluid bolus for rehydration  Test / Admission - Considered:  Patient BIB EMS with LR in process. CBC and CMP results as above.  Concern for patient's mental status, will obtain CT of the head without contrast as well as urinalysis to assess for underlying infectious etiology.  Given patient's recent nausea/vomiting/diarrhea, and history of multiple abdominal surgeries, will obtain CT abdomen/pelvis with contrast. Has Xanax /Percocet/Tramadol  at home, will obtain UDS. COVID/Flu/RSV pending. See above for imaging results.  See above for labs. Patient with temp of 100.2  with EMS, afebrile at this time. CT abdomen/pelvis notable for mild diverticulitis without evidence of abscess/perforation. Given patient's history of complicated diverticulitis resulting in abscess and perforation resulting in need for colectomy, will allow oncoming PA-C to initiate antibiotics.   Signed out to oncoming PA-C pending COVID/Flu/RSV, UA, UDS.     Amount and/or Complexity of Data Reviewed Labs: ordered. Radiology: ordered.  Risk Prescription drug management.           Final Clinical Impression(s) / ED Diagnoses Final diagnoses:  Diverticulitis    Rx / DC Orders ED Discharge Orders     None         Adolm Ahumada 04/21/24 1534    Iva Mariner, MD 04/21/24 [phone]

## 2024-04-21 NOTE — Discharge Instructions (Addendum)
 I am glad you are feeling better.  I have sent you antibiotic to your pharmacy.  Please follow-up with your primary care provider.  Seek emergency care if experiencing any new or worsening symptoms.

## 2024-04-21 NOTE — ED Provider Notes (Signed)
 Patient seen in conjunction with Scott PA-C. See her note for full HPI.   Briefly, patient started having diarrhea last night with severe nausea.  Did not vomit but had several episodes of diarrhea throughout the night.  Has been at bedside to assist with history, states that he recently had what he thought was food poisoning.  He tried to distance himself from the patient, and does not believe that she could have caught what he had.  She has a history of diverticulitis that resulted in a temporary ostomy.  She had some chills and generalized weakness, not really having any abdominal pain but maybe had some overnight.  Physical Exam  BP 113/67 (BP Location: Right Arm)   Pulse 94   Temp 97.8 F (36.6 C) (Oral)   Resp 18   SpO2 100%   Physical Exam Vitals and nursing note reviewed.  Constitutional:      Appearance: Normal appearance.     Comments: Globally weak  HENT:     Head: Normocephalic and atraumatic.  Eyes:     Conjunctiva/sclera: Conjunctivae normal.  Pulmonary:     Effort: Pulmonary effort is normal. No respiratory distress.  Abdominal:     General: Abdomen is flat.     Palpations: Abdomen is soft.     Tenderness: There is no abdominal tenderness.  Skin:    General: Skin is warm and dry.     Comments: Erythematous rash to the face and upper chest, consistent with a contact dermatitis or rosacea  Neurological:     Mental Status: She is alert.  Psychiatric:        Mood and Affect: Mood normal.        Behavior: Behavior normal.    ED Course / MDM    Medical Decision Making  Lab work overall has been fairly reassuring.  Potassium 3.2, otherwise electrolytes grossly at baseline.  X-ray unremarkable.  Pending CT head and abdomen/pelvis.   See Geralyn Knee PA-C note for results and disposition.   Aren Cherne T, PA-C 04/21/24 1425    Iva Mariner, MD 04/21/24 [phone]

## 2024-04-21 NOTE — ED Provider Notes (Signed)
  Accepted handoff at shift change from [facility]. Please see prior provider note for more detail.   Briefly: Patient is 78 y.o. "complaining of nausea/vomiting/diarrhea last night.  Says she "could not keep anything down", cannot recall if she vomited today.  She says "I am just not myself".  Per EMS patient had temperature 100.2, she also mentioned "my blood pressure dropped" last night.  She has a notable rash to her left upper chest with extension onto her face, she believes this is from poison ivy/oak exposure several days ago.  At this time she denies abdominal pain, dysuria, chest pain, shortness of breath, falls.  History of abdominal surgeries include C-section, laparoscopic gastric banding, colectomy following perforated diverticulitis, ventral hernia repair; when asked about these surgeries she only remembers her past C-section.    Spoke with patient's husband at the bedside, he mentions having a bout with "food poisoning" on Friday, however he does not believe that patient was exposed to him while he was ill.  He describes yesterday patient having multiple episodes of nausea/vomiting, when she woke up this morning she was very weak and not acting like herself prompted them to come to the hospital.  Her husband states that she was working in the yard on Saturday, she began to develop a rash on her left chest yesterday and he does believe it looks worse today with extension onto her face."  DDX: concern for: acute viral gastroenteritis, COVID/Flu/RSV, urinary tract infection, dehydration, electrolyte derangement.   Plan:  -Respiratory panel negative.  CBC without leukocytosis or anemia.  CMP with mild hypokalemia 3.2.  UA not concerning for infection. CT showing diverticulitis. - IV zosyn given for diverticulitis. Patient stating that she is feeling a lot better after the IV fluids and was able to walk to bathroom without difficulties. Patient asking to go home. I feel like this is  appropriate given that she feels a lot better. Patient tolerating PO. Patient agrees to finish IV ABX before discharge. - Prescribed Augmentin. Recommended following up with PCP. - Patient afebrile with stable vitals.  Provided with return precautions.  Discharged in good condition.       [place], [place] 04/21/24 1731    Iva Mariner, MD 04/22/24 1520

## 2024-04-21 NOTE — ED Triage Notes (Signed)
 Pt BIB EMS from home, c/o abdominal pain associated with N/V/D since last night. Fevers, chills, and dizziness since last night. BP drops with position change.   BP 124/86 P 100 T 100.2 CBG 122  Capnography 42

## 2024-05-13 ENCOUNTER — Other Ambulatory Visit: Payer: Self-pay | Admitting: Physician Assistant

## 2024-05-13 DIAGNOSIS — K5792 Diverticulitis of intestine, part unspecified, without perforation or abscess without bleeding: Secondary | ICD-10-CM

## 2024-05-17 NOTE — Progress Notes (Signed)
 The patient was presenting to the ED with signs/symptoms of a sudden onset viral illness, including fever/rash/vomiting, prompting me to order a COVID/Flu/RSV swab among other tests.

## 2024-05-28 ENCOUNTER — Other Ambulatory Visit: Payer: Self-pay | Admitting: Physician Assistant

## 2024-05-28 DIAGNOSIS — M0579 Rheumatoid arthritis with rheumatoid factor of multiple sites without organ or systems involvement: Secondary | ICD-10-CM

## 2024-05-28 NOTE — Telephone Encounter (Signed)
 Last Fill: 03/03/2024  Eye exam: 12/24/2023 WNL   Labs: 04/21/2024 Potassium 3.2 Glucose 127 Creatinine 1.14 Calcium 8.8 Alkaline Phosphatase 34 GFR Est 50 MCV 105.9 MCH 34.4   Next Visit: 09/16/2024  Last Visit: 04/16/2024  [id number] arthritis involving multiple sites with positive rheumatoid factor   Current Dose per office note 04/16/2024: Plaquenil  200 mg 1 tablet by mouth BID M-F   Okay to refill Plaquenil ?

## 2024-05-29 ENCOUNTER — Ambulatory Visit
Admission: RE | Admit: 2024-05-29 | Discharge: 2024-05-29 | Disposition: A | Discharge status: Home / Self Care | Source: Ambulatory Visit | Attending: Physician Assistant | Admitting: Physician Assistant

## 2024-05-29 DIAGNOSIS — K5792 Diverticulitis of intestine, part unspecified, without perforation or abscess without bleeding: Secondary | ICD-10-CM

## 2024-05-29 MED ORDER — IOPAMIDOL (ISOVUE-300) INJECTION 61%
100.0000 mL | Freq: Once | INTRAVENOUS | Status: AC | PRN
  Administered 2024-05-29: 100 mL via INTRAVENOUS

## 2024-06-05 LAB — HM DEXA SCAN: HM Dexa Scan: NORMAL

## 2024-06-10 ENCOUNTER — Telehealth: Payer: Self-pay | Admitting: *Deleted

## 2024-06-10 NOTE — Telephone Encounter (Signed)
 Received DEXA results from [facility].  Date of Scan: 06/05/2024  Lowest T-score:-1.0  BMD:0.824  Lowest site measured:Right Total Hip  DX: WNL  Significant changes in BMD and site measured (5% and above):-5 % Left 1/3 Radius, -7 % Right Total Femur  Current Regimen:Vitamin D    Recommendation:Discuss at follow up visit.   Reviewed by:Jacinta Martinis, PA-C  Next Appointment:  09/16/2024

## 2024-07-07 ENCOUNTER — Encounter: Payer: Self-pay | Admitting: Rheumatology

## 2024-07-16 ENCOUNTER — Other Ambulatory Visit: Payer: Self-pay | Admitting: Physician Assistant

## 2024-07-16 MED ORDER — FOLIC ACID 1 MG PO TABS: 1.0000 mg | ORAL_TABLET | Freq: Every day | ORAL | 3 refills | Status: AC

## 2024-07-16 NOTE — Telephone Encounter (Signed)
 Last Fill: 03/26/2024  Next Visit: 09/16/2024  Last Visit: 04/16/2024  Dx: Rheumatoid arthritis involving multiple sites with positive rheumatoid factor   Current Dose per office note on 04/16/2024: prednisone  5 mg alternating with 10 mg every other day , folic acid  1 mg daily   Okay to refill Prednisone  and Folic Acid ?    Patient contacted the office to see if we could send in a prescription for the folic acid  to the new pharmacy. Contacted the old pharmacy to cancel the prescription.

## 2024-07-17 LAB — OPHTHALMOLOGY REPORT-SCANNED: A Comment: NORMAL

## 2024-07-23 ENCOUNTER — Telehealth: Payer: Self-pay | Admitting: Rheumatology

## 2024-07-23 NOTE — Telephone Encounter (Signed)
 Updated primary pharmacy to [facility]

## 2024-07-23 NOTE — Telephone Encounter (Signed)
 Pt would like have ALL her medication [facility] on [address].

## 2024-09-02 NOTE — Progress Notes (Unsigned)
 Office Visit Note  Patient: Vanessa Collins             Date of Birth: 1946/09/16           MRN: [id number]             PCP: Redmon, Noelle, PA Referring: Redmon, Noelle, PA Visit Date: 09/16/2024 Occupation: @GUAROCC @  Subjective:  Medication monitoring   History of Present Illness: Vanessa Collins is a 78 y.o. female with history of seropositive rheumatoid arthritis and fibromyalgia.  Patient remains on  Methotrexate  0.3 ml sq inj once weekly, folic acid  1 mg daily, PLQ 200 mg 1 tablet by mouth BID M-F,prednisone  5 mg alternating with 10 mg every other day.  Patient has been unable to reduce the dose of prednisone .  She continues to experience chronic pain involving multiple joints.  Patient continues to walk in her neighborhood on a daily basis and performs stretching and Pilates exercises regularly.  Her activity level remains limited due to ongoing vertigo for the past 4 years.  She is unable to drive long distances due to vertigo so she is no longer going to water aerobics. She denies any recent or recurrent infections. She had an updated DEXA on 06/05/24.       Activities of Daily Living:  Patient reports morning stiffness for 5 hours.   Patient Reports nocturnal pain.  Difficulty dressing/grooming: Reports Difficulty climbing stairs: Denies Difficulty getting out of chair: Reports Difficulty using hands for taps, buttons, cutlery, and/or writing: Reports  Review of Systems  Constitutional:  Positive for fatigue.  HENT:  Negative for mouth sores and mouth dryness.   Eyes:  Positive for dryness.  Respiratory:  Negative for shortness of breath.   Cardiovascular:  Negative for chest pain and palpitations.  Gastrointestinal:  Negative for blood in stool, constipation and diarrhea.  Endocrine: Negative for increased urination.  Genitourinary:  Negative for involuntary urination.  Musculoskeletal:  Positive for joint pain, gait problem, joint pain, joint swelling, myalgias, muscle  weakness, morning stiffness, muscle tenderness and myalgias.  Skin:  Positive for color change. Negative for rash, hair loss and sensitivity to sunlight.  Allergic/Immunologic: Negative for susceptible to infections.  Neurological:  Positive for dizziness. Negative for headaches.  Hematological:  Negative for swollen glands.  Psychiatric/Behavioral:  Positive for depressed mood. Negative for sleep disturbance. The patient is nervous/anxious.     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 10/09/2018 10/09/2018   History of depression 06/19/2017   Rheumatoid arthritis involving multiple sites with positive rheumatoid factor (HCC) 01/25/2017   High risk medication use 01/25/2017   Immunosuppression 01/25/2017   Fibromyalgia 01/25/2017   Primary osteoarthritis of both hands 01/25/2017   Spondylosis of lumbar region without myelopathy or radiculopathy 01/25/2017   DJD (degenerative joint disease), cervical 01/25/2017   History of neutropenia 01/25/2017   History of coronary artery disease 01/25/2017   Lapband APS April 2010 02/25/2013   Wears dentures     Past Medical History:  Diagnosis Date   Arthritis    rheumatoid , osteo    CAD (coronary artery disease)    PCI to LAD 2010   Diverticulitis    with perforation and colostomy placement     Fibromyalgia  Heart disease    Heart murmur    Hyperlipidemia    Hypertension    Myocardial infarction [facility]) 2008   one Stent   Shingles 03/2020   per patient    Vertigo    per patient    Vitamin D  deficiency    Wears dentures    gum disease    Family History  Problem Relation Age of Onset   Macular degeneration Mother    Cancer Father    Rheum arthritis Father     Bipolar disorder Father    Bipolar disorder Daughter    Rheum arthritis Daughter    Past Surgical History:  Procedure Laterality Date   CESAREAN SECTION     COLECTOMY WITH COLOSTOMY CREATION/HARTMANN PROCEDURE  09/2017   Perforated diverticulitis   CORONARY STENT PLACEMENT  08/2009   EYE SURGERY     LAPAROSCOPIC GASTRIC BANDING  04/05/2009   Dr Gladis   PROCTOSCOPY N/A 10/09/2018   Procedure: RIDGID PROCTOSCOPY;  Surgeon: Sheldon Standing, MD;  Location: [facility];  Service: General;  Laterality: N/A;   VAGINAL HYSTERECTOMY     VENTRAL HERNIA REPAIR N/A 07/11/2019   Procedure: LAPAROSCOPIC ASSISTED VENTRAL HERNIA REPAIR;  Surgeon: Gladis Cough, MD;  Location: [facility];  Service: General;  Laterality: N/A;   Social History   Social History Narrative   Not on file   Immunization History  Administered Date(s) Administered   PFIZER(Purple Top)SARS-COV-2 Vaccination 02/22/2020, 04/07/2020     Objective: Vital Signs: BP 113/74   Pulse 69   Temp 97.6 F (36.4 C)   Resp 14   Ht 5' 4 (1.626 m)   Wt 155 lb 9.6 oz (70.6 kg)   BMI 26.71 kg/m    Physical Exam Vitals and nursing note reviewed.  Constitutional:      Appearance: She is well-developed.  HENT:     Head: Normocephalic and atraumatic.  Eyes:     Conjunctiva/sclera: Conjunctivae normal.  Cardiovascular:     Rate and Rhythm: Normal rate and regular rhythm.     Heart sounds: Normal heart sounds.  Pulmonary:     Effort: Pulmonary effort is normal.     Breath sounds: Normal breath sounds.  Abdominal:     General: Bowel sounds are normal.     Palpations: Abdomen is soft.  Musculoskeletal:     Cervical back: Normal range of motion.  Lymphadenopathy:     Cervical: No cervical adenopathy.  Skin:    General: Skin is warm and dry.     Capillary Refill: Capillary refill takes less than 2 seconds.  Neurological:     Mental Status: She is alert and oriented to person, place, and time.  Psychiatric:        Behavior:  Behavior normal.      Musculoskeletal Exam: C-spine has limited range of motion bilateral rotation.  Thoracic kyphosis noted.  Discomfort with range of motion of the right shoulder.  Elbow joints are good range of motion no tenderness along the joint line.  Wrist joints have good range of motion no tenderness or synovitis.  Synovial thickening of all MCP joints but no active synovitis.  CMC, PIP, DIP thickening consistent with osteoarthritis of both hands.  Knee joints have good range of motion no warmth or effusion.  Synovial thickening of both ankle joints but no active synovitis noted.  CDAI Exam: CDAI Score: -- Patient Global: --; Provider Global: -- Swollen: --; Tender: -- Joint Exam 09/16/2024   No joint exam has been documented  for this visit   There is currently no information documented on the homunculus. Go to the Rheumatology activity and complete the homunculus joint exam.  Investigation: No additional findings.  Imaging: No results found.  Recent Labs: Lab Results  Component Value Date   WBC 8.1 04/21/2024   HGB 13.3 04/21/2024   PLT 204 04/21/2024   NA 138 04/21/2024   K 3.2 (L) 04/21/2024   CL 102 04/21/2024   CO2 26 04/21/2024   GLUCOSE 127 (H) 04/21/2024   BUN 23 04/21/2024   CREATININE 1.14 (H) 04/21/2024   BILITOT 0.6 04/21/2024   ALKPHOS 34 (L) 04/21/2024   AST 23 04/21/2024   ALT 19 04/21/2024   PROT 6.6 04/21/2024   ALBUMIN 3.7 04/21/2024   CALCIUM 8.8 (L) 04/21/2024   GFRAA 72 04/26/2021    Speciality Comments: PLQ eye exam 12/24/2023 [facility] Follow up in 1 year.   Prior therapy includes: Xeljanz  (GI perforation), Humira (inadequate response), Enbrel (inadequate response), and Orencia (nausea and headaches)  Procedures:  No procedures performed Allergies: Vicodin [hydrocodone -acetaminophen ] and Aspirin      Assessment / Plan:     Visit Diagnoses: Rheumatoid arthritis involving multiple sites with positive rheumatoid factor  (HCC): No synovitis was noted on examination today.  She continues to have chronic pain and stiffness involving multiple joints.  She remains on methotrexate  0.3 mL subcu injections once weekly and Plaquenil  200 mg 1 tablet by mouth twice daily Monday through Friday.  She is alternating prednisone  5 mg and 10 mg every other day--unable to taper the dose of prednisone .  If she experiences symptoms of a flare she increases the dose of prednisone  to 10 mg daily for several days. Previous therapy includes Xeljanz , Humira, Enbrel, and Orencia.  Xeljanz  was discontinued due to previous GI perforation.  She is not a good candidate for Jak inhibitors or IL-6 inhibitors due to this adverse effect. No medication changes will be made at this time.  She was advised to notify us  if she develops signs or symptoms of a flare.  She will follow-up in the office in 5 months or sooner if needed.  High risk medication use - Methotrexate  0.3 ml sq inj once weekly, folic acid  1 mg daily, PLQ 200 mg 1 tablet by mouth BID M-F,prednisone  5 mg alternating with 10 mg every other day.  CBC and CMP updated on 04/21/24. Orders for CBC and CMP released today.  Discussed the importance of holding methotrexate  if she develops signs or symptoms of an infection and to resume once the infection has completely cleared.   Lipid panel updated on 03/25/24.  Hgb A1c updated on 03/25/24 PLQ eye exam 12/24/2023 [facility] Follow up in 1 year.  - Plan: CBC with Differential/Platelet, Comprehensive metabolic panel with GFR  Current chronic use of systemic steroids - Unable to taper prednisone -- she has been on prednisone  5 mg alternating with 10 mg every other day. Hemoglobin A1c was 5.4% on 03/25/2024. DEXA within normal limits on 06/05/2024. DEXA update done 06/05/2024: Right total hip BMD 0.824 with T-score -1.0.  -7% change in BMD.  Left third radius BMD 0.747 with T-score +0.90.  -5% change in BMD.  Bone density within normal limits.   Recommend repeating bone density in 2 years given long-term systemic steroid use.  Chronic right shoulder pain: Patient continues to experience intermittent discomfort in the right shoulder.  She previously had a right glenohumeral joint cortisone injection on 02/06/2023 but her  discomfort has persisted.  She has been unable to perform weight lifting exercises due to the discomfort in her right shoulder.  Primary osteoarthritis of both hands: She has PIP and DIP thickening consistent with osteoarthritis of both hands.  No synovitis noted on examination today.  DDD (degenerative disc disease), cervical: Limited range of motion without rotation.  Thoracic kyphosis noted.  Degeneration of intervertebral disc of lumbar region without discogenic back pain or lower extremity pain - Followed by Dr. Orlando.  Fibromyalgia: She continues to have generalized hyperalgesia and positive tender points on exam.  She has been performing stretching exercises daily and has been walking for exercise.  She is no longer going to water aerobics and she has difficulty driving due to vertigo.  Other fatigue: Chronic, stable.  Other medical conditions are listed as follows:  Abnormal SPEP  Vitamin D  deficiency  Lapband APS April 2010  Status post colostomy [facility])  History of coronary artery disease  History of hyperlipidemia: She remains on simvastatin.   Essential hypertension, benign: Blood pressure was 113/74 today in the office.  History of depression  Orders: Orders Placed This Encounter  Procedures   CBC with Differential/Platelet   Comprehensive metabolic panel with GFR   No orders of the defined types were placed in this encounter.    Follow-Up Instructions: Return in about 5 months (around 02/16/2025) for Rheumatoid arthritis.   Waddell CHRISTELLA Craze, PA-C  Note - This record has been created using Dragon software.  Chart creation errors have been sought, but may not always  have been located.  Such creation errors do not reflect on  the standard of medical care.

## 2024-09-11 LAB — LAB REPORT - SCANNED
A1c: 5.5
Creatinine, POC: 195 mg/dL
EGFR: 51
Microalb Creat Ratio: 4
Microalbumin, Urine: 0.78
TSH: 1.37

## 2024-09-16 ENCOUNTER — Ambulatory Visit: Attending: Physician Assistant | Admitting: Physician Assistant

## 2024-09-16 ENCOUNTER — Encounter: Payer: Self-pay | Admitting: Physician Assistant

## 2024-09-16 VITALS — BP 113/74 | HR 69 | Temp 97.6°F | Resp 14 | Ht 64.0 in | Wt 155.6 lb

## 2024-09-16 DIAGNOSIS — M25511 Pain in right shoulder: Secondary | ICD-10-CM | POA: Diagnosis not present

## 2024-09-16 DIAGNOSIS — Z9884 Bariatric surgery status: Secondary | ICD-10-CM

## 2024-09-16 DIAGNOSIS — G8929 Other chronic pain: Secondary | ICD-10-CM

## 2024-09-16 DIAGNOSIS — Z79899 Other long term (current) drug therapy: Secondary | ICD-10-CM

## 2024-09-16 DIAGNOSIS — R5383 Other fatigue: Secondary | ICD-10-CM

## 2024-09-16 DIAGNOSIS — M5030 Other cervical disc degeneration, unspecified cervical region: Secondary | ICD-10-CM

## 2024-09-16 DIAGNOSIS — R778 Other specified abnormalities of plasma proteins: Secondary | ICD-10-CM

## 2024-09-16 DIAGNOSIS — M797 Fibromyalgia: Secondary | ICD-10-CM

## 2024-09-16 DIAGNOSIS — Z8659 Personal history of other mental and behavioral disorders: Secondary | ICD-10-CM

## 2024-09-16 DIAGNOSIS — Z7952 Long term (current) use of systemic steroids: Secondary | ICD-10-CM | POA: Diagnosis not present

## 2024-09-16 DIAGNOSIS — M0579 Rheumatoid arthritis with rheumatoid factor of multiple sites without organ or systems involvement: Secondary | ICD-10-CM

## 2024-09-16 DIAGNOSIS — Z8679 Personal history of other diseases of the circulatory system: Secondary | ICD-10-CM

## 2024-09-16 DIAGNOSIS — Z8639 Personal history of other endocrine, nutritional and metabolic disease: Secondary | ICD-10-CM

## 2024-09-16 DIAGNOSIS — M19041 Primary osteoarthritis, right hand: Secondary | ICD-10-CM

## 2024-09-16 DIAGNOSIS — E559 Vitamin D deficiency, unspecified: Secondary | ICD-10-CM

## 2024-09-16 DIAGNOSIS — M19042 Primary osteoarthritis, left hand: Secondary | ICD-10-CM

## 2024-09-16 DIAGNOSIS — I10 Essential (primary) hypertension: Secondary | ICD-10-CM

## 2024-09-16 DIAGNOSIS — M51369 Other intervertebral disc degeneration, lumbar region without mention of lumbar back pain or lower extremity pain: Secondary | ICD-10-CM

## 2024-09-16 DIAGNOSIS — Z933 Colostomy status: Secondary | ICD-10-CM

## 2024-09-16 LAB — COMPREHENSIVE METABOLIC PANEL WITH GFR
AG Ratio: 1.9 (calc) (ref 1.0–2.5)
ALT: 16 U/L (ref 6–29)
AST: 18 U/L (ref 10–35)
Albumin: 4 g/dL (ref 3.6–5.1)
Alkaline phosphatase (APISO): 36 U/L — ABNORMAL LOW (ref 37–153)
BUN/Creatinine Ratio: 21 (calc) (ref 6–22)
BUN: 22 mg/dL (ref 7–25)
CO2: 32 mmol/L (ref 20–32)
Calcium: 9.5 mg/dL (ref 8.6–10.4)
Chloride: 105 mmol/L (ref 98–110)
Creat: 1.05 mg/dL — ABNORMAL HIGH (ref 0.60–1.00)
Globulin: 2.1 g/dL (ref 1.9–3.7)
Glucose, Bld: 93 mg/dL (ref 65–99)
Potassium: 4.8 mmol/L (ref 3.5–5.3)
Sodium: 141 mmol/L (ref 135–146)
Total Bilirubin: 0.5 mg/dL (ref 0.2–1.2)
Total Protein: 6.1 g/dL (ref 6.1–8.1)
eGFR: 55 mL/min/1.73m2 — ABNORMAL LOW (ref >=60)

## 2024-09-16 LAB — CBC WITH DIFFERENTIAL/PLATELET
Absolute Lymphocytes: 1152 {cells}/uL (ref 850–3900)
Absolute Monocytes: 389 {cells}/uL (ref 200–950)
Basophils Absolute: 40 {cells}/uL (ref 0–200)
Basophils Relative: 0.6 %
Eosinophils Absolute: 40 {cells}/uL (ref 15–500)
Eosinophils Relative: 0.6 %
HCT: 35.4 % (ref 35.0–45.0)
Hemoglobin: 11.9 g/dL (ref 11.7–15.5)
MCH: 34.5 pg — ABNORMAL HIGH (ref 27.0–33.0)
MCHC: 33.6 g/dL (ref 32.0–36.0)
MCV: 102.6 fL — ABNORMAL HIGH (ref 80.0–100.0)
MPV: 10.7 fL (ref 7.5–12.5)
Monocytes Relative: 5.8 %
Neutro Abs: 5079 {cells}/uL (ref 1500–7800)
Neutrophils Relative %: 75.8 %
Platelets: 200 Thousand/uL (ref 140–400)
RBC: 3.45 Million/uL — ABNORMAL LOW (ref 3.80–5.10)
RDW: 14.2 % (ref 11.0–15.0)
Total Lymphocyte: 17.2 %
WBC: 6.7 Thousand/uL (ref 3.8–10.8)

## 2024-09-16 NOTE — Patient Instructions (Signed)

## 2024-09-17 ENCOUNTER — Ambulatory Visit: Payer: Self-pay | Admitting: Rheumatology

## 2024-09-17 NOTE — Progress Notes (Signed)
 CBC and CMP are stable. Creatinine remains mildly elevated. Please forawrd results to her PCP.

## 2024-09-21 ENCOUNTER — Other Ambulatory Visit: Payer: Self-pay | Admitting: Physician Assistant

## 2024-09-21 DIAGNOSIS — M0579 Rheumatoid arthritis with rheumatoid factor of multiple sites without organ or systems involvement: Secondary | ICD-10-CM

## 2024-09-22 NOTE — Telephone Encounter (Signed)
 Last Fill: 05/28/2024 Hydroxychloroquine  07/16/2024 Prednisone   Eye exam: 12/24/2023 WNL   Labs: 09/16/2024 CBC and CMP are stable. Creatinine remains mildly elevated. Please forawrd results to her PCP.   Next Visit: 02/26/2024  Last Visit: 09/16/2024  DX: Rheumatoid arthritis involving multiple sites with positive rheumatoid factor (HCC)   Current Dose per office note 09/16/2024: PLQ 200 mg 1 tablet by mouth BID M-F,prednisone  5 mg alternating with 10 mg every other day.   Okay to refill Plaquenil  and Prednisone ?

## 2024-09-23 ENCOUNTER — Telehealth: Payer: Self-pay

## 2024-09-23 NOTE — Telephone Encounter (Signed)
 Labs received from:[facility]  Drawn on:09/11/2024  Reviewed by:Waddell Craze, PA-C  Labs drawn:CMP, Hemoglobin A1c, Lipid Panel w/ Reflex, Microalbumin Panel, TSH  Results:  eGFR 51 T Protein 5.9 ALP 36 Cholesterol 271 HDLD 88 Calc LDL 159 Non-HDL 183

## 2024-09-26 ENCOUNTER — Encounter: Payer: Self-pay | Admitting: Physician Assistant

## 2024-11-14 ENCOUNTER — Other Ambulatory Visit: Payer: Self-pay | Admitting: Physician Assistant

## 2024-11-14 NOTE — Telephone Encounter (Signed)
 Last Fill: 03/26/2024  Labs: 09/16/2024 CBC and CMP are stable. Creatinine remains mildly elevated.   Next Visit: 02/25/2025  Last Visit: 09/16/2024  DX:  Rheumatoid arthritis involving multiple sites with positive rheumatoid factor   Current Dose per office note 09/16/2024: methotrexate  0.3 mL subcu injections once weekly   Okay to refill Methotrexate ?

## 2024-12-10 ENCOUNTER — Other Ambulatory Visit: Payer: Self-pay | Admitting: Physician Assistant

## 2024-12-11 NOTE — Telephone Encounter (Signed)
 Last Fill: 11/01/2023  Next Visit: 02/25/2025  Last Visit: 09/16/2024  Dx: Rheumatoid arthritis involving multiple sites with positive rheumatoid factor   Current Dose per office note on 09/16/2024: Methotrexate  0.3 ml sq inj once weekly   Okay to refill Syringes?

## 2024-12-24 ENCOUNTER — Other Ambulatory Visit: Payer: Self-pay | Admitting: Physician Assistant

## 2024-12-24 DIAGNOSIS — M0579 Rheumatoid arthritis with rheumatoid factor of multiple sites without organ or systems involvement: Secondary | ICD-10-CM

## 2024-12-26 NOTE — Telephone Encounter (Signed)
 Last Fill: 09/22/2024  Eye exam: 12/24/2023 WNL   Labs: 09/16/2024 CBC and CMP are stable. Creatinine remains mildly elevated. Please forawrd results to her PCP.   Next Visit: 02/25/2025  Last Visit: 09/16/2024  IK:Myzlfjunpi arthritis involving multiple sites with positive rheumatoid factor (HCC)   Current Dose per office note 09/16/2024: PLQ 200 mg 1 tablet by mouth BID M-F,prednisone  5 mg alternating with 10 mg every other day.   Attempted to contact the patient regarding her eye exam. Left a message to call the office back.   Okay to refill Plaquenil  and Prednisone ?

## 2024-12-26 NOTE — Telephone Encounter (Signed)
 Patient contacted the office and Patient states she had an eye exam in 06/2024 and she contacted her eye doctor who should be sending documentation over.

## 2025-01-15 ENCOUNTER — Other Ambulatory Visit: Payer: Self-pay | Admitting: Physician Assistant

## 2025-02-25 ENCOUNTER — Ambulatory Visit: Admitting: Rheumatology
